# Patient Record
Sex: Male | Born: 1980 | Race: Black or African American | Hispanic: No | Marital: Single
Health system: Southern US, Community
[De-identification: ages and names within clinical notes are randomized; demographics above are authoritative.]

## PROBLEM LIST (undated history)

## (undated) DIAGNOSIS — W3400XA Accidental discharge from unspecified firearms or gun, initial encounter: Secondary | ICD-10-CM

## (undated) DIAGNOSIS — G8220 Paraplegia, unspecified: Secondary | ICD-10-CM

## (undated) DIAGNOSIS — I10 Essential (primary) hypertension: Secondary | ICD-10-CM

## (undated) DIAGNOSIS — Z87440 Personal history of urinary (tract) infections: Secondary | ICD-10-CM

## (undated) DIAGNOSIS — L89899 Pressure ulcer of other site, unspecified stage: Secondary | ICD-10-CM

## (undated) DIAGNOSIS — N529 Male erectile dysfunction, unspecified: Secondary | ICD-10-CM

## (undated) DIAGNOSIS — F419 Anxiety disorder, unspecified: Secondary | ICD-10-CM

## (undated) DIAGNOSIS — I82409 Acute embolism and thrombosis of unspecified deep veins of unspecified lower extremity: Secondary | ICD-10-CM

## (undated) HISTORY — PX: LACERATION REPAIR: SHX5168

## (undated) HISTORY — DX: Acute embolism and thrombosis of unspecified deep veins of unspecified lower extremity: I82.409

## (undated) HISTORY — DX: Male erectile dysfunction, unspecified: N52.9

## (undated) HISTORY — PX: VENA CAVA FILTER PLACEMENT: SUR1032

## (undated) HISTORY — DX: Pressure ulcer of other site, unspecified stage: L89.899

## (undated) HISTORY — DX: Anxiety disorder, unspecified: F41.9

## (undated) HISTORY — PX: LAMINECTOMY: SHX219

## (undated) HISTORY — DX: Personal history of urinary (tract) infections: Z87.440

## (undated) HISTORY — DX: Paraplegia, unspecified: G82.20

---
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
## 1998-03-07 ENCOUNTER — Encounter: Payer: Self-pay | Admitting: Cardiothoracic Surgery

## 1998-03-07 ENCOUNTER — Encounter: Payer: Self-pay | Admitting: General Surgery

## 1998-03-07 ENCOUNTER — Encounter: Payer: Self-pay | Admitting: *Deleted

## 1998-03-07 ENCOUNTER — Inpatient Hospital Stay: Admission: EM | Admit: 1998-03-07 | Discharge: 1998-03-13 | Payer: Self-pay | Admitting: *Deleted

## 1998-03-09 ENCOUNTER — Encounter: Payer: Self-pay | Admitting: Cardiothoracic Surgery

## 1998-03-09 ENCOUNTER — Encounter: Payer: Self-pay | Admitting: *Deleted

## 1998-03-10 ENCOUNTER — Encounter: Payer: Self-pay | Admitting: Cardiothoracic Surgery

## 1998-03-12 ENCOUNTER — Encounter: Payer: Self-pay | Admitting: Thoracic Surgery (Cardiothoracic Vascular Surgery)

## 1998-03-13 ENCOUNTER — Inpatient Hospital Stay (HOSPITAL_COMMUNITY)
Admission: RE | Admit: 1998-03-13 | Discharge: 1998-04-03 | Payer: Self-pay | Admitting: Physical Medicine and Rehabilitation

## 1998-04-07 ENCOUNTER — Encounter: Admission: RE | Admit: 1998-04-07 | Discharge: 1998-07-06 | Payer: Self-pay

## 2001-09-24 ENCOUNTER — Emergency Department (HOSPITAL_COMMUNITY): Admission: EM | Admit: 2001-09-24 | Discharge: 2001-09-24 | Payer: Self-pay

## 2003-03-26 ENCOUNTER — Encounter: Payer: Self-pay | Admitting: Internal Medicine

## 2003-03-26 ENCOUNTER — Encounter: Admission: RE | Admit: 2003-03-26 | Discharge: 2003-03-26 | Payer: Self-pay | Admitting: Internal Medicine

## 2003-10-22 ENCOUNTER — Emergency Department (HOSPITAL_COMMUNITY): Admission: EM | Admit: 2003-10-22 | Discharge: 2003-10-22 | Payer: Self-pay | Admitting: Emergency Medicine

## 2003-12-13 ENCOUNTER — Emergency Department (HOSPITAL_COMMUNITY): Admission: EM | Admit: 2003-12-13 | Discharge: 2003-12-14 | Payer: Self-pay | Admitting: Emergency Medicine

## 2005-05-12 ENCOUNTER — Ambulatory Visit: Payer: Self-pay | Admitting: Internal Medicine

## 2005-05-14 ENCOUNTER — Encounter (HOSPITAL_BASED_OUTPATIENT_CLINIC_OR_DEPARTMENT_OTHER): Admission: RE | Admit: 2005-05-14 | Discharge: 2005-08-12 | Payer: Self-pay | Admitting: Surgery

## 2005-10-27 ENCOUNTER — Encounter: Payer: Self-pay | Admitting: Physical Medicine and Rehabilitation

## 2005-10-27 ENCOUNTER — Encounter: Payer: Self-pay | Admitting: General Surgery

## 2005-10-27 ENCOUNTER — Encounter: Payer: Self-pay | Admitting: Cardiothoracic Surgery

## 2006-04-11 ENCOUNTER — Ambulatory Visit: Payer: Self-pay | Admitting: Internal Medicine

## 2006-06-09 ENCOUNTER — Ambulatory Visit: Payer: Self-pay | Admitting: Internal Medicine

## 2006-10-08 ENCOUNTER — Inpatient Hospital Stay (HOSPITAL_COMMUNITY): Admission: EM | Admit: 2006-10-08 | Discharge: 2006-10-11 | Payer: Self-pay | Admitting: Emergency Medicine

## 2006-11-16 ENCOUNTER — Ambulatory Visit: Payer: Self-pay | Admitting: Internal Medicine

## 2007-01-25 ENCOUNTER — Ambulatory Visit: Payer: Self-pay | Admitting: Internal Medicine

## 2007-03-09 ENCOUNTER — Ambulatory Visit: Payer: Self-pay | Admitting: Internal Medicine

## 2007-04-10 ENCOUNTER — Encounter (HOSPITAL_BASED_OUTPATIENT_CLINIC_OR_DEPARTMENT_OTHER): Admission: RE | Admit: 2007-04-10 | Discharge: 2007-07-09 | Payer: Self-pay | Admitting: Surgery

## 2007-04-25 ENCOUNTER — Encounter: Payer: Self-pay | Admitting: Internal Medicine

## 2007-05-09 ENCOUNTER — Encounter: Payer: Self-pay | Admitting: Internal Medicine

## 2007-05-09 DIAGNOSIS — N390 Urinary tract infection, site not specified: Secondary | ICD-10-CM

## 2007-05-09 DIAGNOSIS — F528 Other sexual dysfunction not due to a substance or known physiological condition: Secondary | ICD-10-CM

## 2007-05-09 DIAGNOSIS — F411 Generalized anxiety disorder: Secondary | ICD-10-CM

## 2007-05-09 DIAGNOSIS — N529 Male erectile dysfunction, unspecified: Secondary | ICD-10-CM | POA: Insufficient documentation

## 2007-05-09 DIAGNOSIS — G8220 Paraplegia, unspecified: Secondary | ICD-10-CM

## 2007-05-09 HISTORY — DX: Paraplegia, unspecified: G82.20

## 2008-03-29 ENCOUNTER — Ambulatory Visit: Payer: Self-pay | Admitting: Internal Medicine

## 2008-03-29 DIAGNOSIS — L89309 Pressure ulcer of unspecified buttock, unspecified stage: Secondary | ICD-10-CM | POA: Insufficient documentation

## 2008-03-29 DIAGNOSIS — L89899 Pressure ulcer of other site, unspecified stage: Secondary | ICD-10-CM

## 2008-03-29 DIAGNOSIS — F1010 Alcohol abuse, uncomplicated: Secondary | ICD-10-CM

## 2008-04-22 ENCOUNTER — Encounter: Payer: Self-pay | Admitting: Internal Medicine

## 2008-06-13 ENCOUNTER — Encounter: Payer: Self-pay | Admitting: Internal Medicine

## 2008-07-17 ENCOUNTER — Telehealth: Payer: Self-pay | Admitting: Internal Medicine

## 2008-07-18 ENCOUNTER — Encounter: Payer: Self-pay | Admitting: Internal Medicine

## 2008-08-05 ENCOUNTER — Encounter: Payer: Self-pay | Admitting: Internal Medicine

## 2008-09-06 ENCOUNTER — Ambulatory Visit: Payer: Self-pay | Admitting: Internal Medicine

## 2008-09-06 DIAGNOSIS — K5289 Other specified noninfective gastroenteritis and colitis: Secondary | ICD-10-CM

## 2008-09-06 DIAGNOSIS — R030 Elevated blood-pressure reading, without diagnosis of hypertension: Secondary | ICD-10-CM | POA: Insufficient documentation

## 2008-10-07 ENCOUNTER — Telehealth: Payer: Self-pay | Admitting: Internal Medicine

## 2008-10-07 ENCOUNTER — Encounter: Payer: Self-pay | Admitting: Internal Medicine

## 2008-10-23 ENCOUNTER — Encounter: Payer: Self-pay | Admitting: Internal Medicine

## 2008-11-28 ENCOUNTER — Telehealth: Payer: Self-pay | Admitting: Internal Medicine

## 2009-01-14 ENCOUNTER — Telehealth: Payer: Self-pay | Admitting: Internal Medicine

## 2009-01-22 ENCOUNTER — Encounter (HOSPITAL_BASED_OUTPATIENT_CLINIC_OR_DEPARTMENT_OTHER): Admission: RE | Admit: 2009-01-22 | Discharge: 2009-04-22 | Payer: Self-pay | Admitting: Internal Medicine

## 2009-01-23 ENCOUNTER — Encounter: Payer: Self-pay | Admitting: Internal Medicine

## 2009-02-25 ENCOUNTER — Telehealth: Payer: Self-pay | Admitting: Internal Medicine

## 2009-03-10 ENCOUNTER — Telehealth: Payer: Self-pay | Admitting: Internal Medicine

## 2009-06-03 ENCOUNTER — Encounter: Payer: Self-pay | Admitting: Internal Medicine

## 2009-06-03 ENCOUNTER — Telehealth: Payer: Self-pay | Admitting: Internal Medicine

## 2009-06-05 ENCOUNTER — Telehealth: Payer: Self-pay | Admitting: Internal Medicine

## 2009-10-28 ENCOUNTER — Telehealth: Payer: Self-pay | Admitting: Internal Medicine

## 2009-11-04 ENCOUNTER — Inpatient Hospital Stay (HOSPITAL_COMMUNITY): Admission: AD | Admit: 2009-11-04 | Discharge: 2009-11-14 | Payer: Self-pay | Admitting: Internal Medicine

## 2009-11-04 ENCOUNTER — Ambulatory Visit: Payer: Self-pay | Admitting: Vascular Surgery

## 2009-11-04 ENCOUNTER — Ambulatory Visit: Payer: Self-pay | Admitting: Internal Medicine

## 2009-11-04 ENCOUNTER — Ambulatory Visit (HOSPITAL_COMMUNITY): Admission: RE | Admit: 2009-11-04 | Discharge: 2009-11-04 | Payer: Self-pay | Admitting: Internal Medicine

## 2009-11-04 ENCOUNTER — Encounter: Payer: Self-pay | Admitting: Internal Medicine

## 2009-11-04 DIAGNOSIS — N3000 Acute cystitis without hematuria: Secondary | ICD-10-CM

## 2009-11-04 DIAGNOSIS — R609 Edema, unspecified: Secondary | ICD-10-CM

## 2009-11-06 ENCOUNTER — Ambulatory Visit: Payer: Self-pay | Admitting: Internal Medicine

## 2009-11-06 ENCOUNTER — Encounter: Payer: Self-pay | Admitting: Internal Medicine

## 2009-11-14 ENCOUNTER — Telehealth: Payer: Self-pay | Admitting: Internal Medicine

## 2009-11-14 DIAGNOSIS — I2699 Other pulmonary embolism without acute cor pulmonale: Secondary | ICD-10-CM

## 2009-11-17 ENCOUNTER — Telehealth: Payer: Self-pay | Admitting: Internal Medicine

## 2009-11-17 ENCOUNTER — Ambulatory Visit: Payer: Self-pay | Admitting: Cardiology

## 2009-11-20 ENCOUNTER — Telehealth: Payer: Self-pay | Admitting: Internal Medicine

## 2009-11-20 ENCOUNTER — Encounter: Payer: Self-pay | Admitting: Internal Medicine

## 2009-11-21 ENCOUNTER — Ambulatory Visit: Payer: Self-pay | Admitting: Cardiology

## 2009-11-21 ENCOUNTER — Telehealth: Payer: Self-pay | Admitting: Pulmonary Disease

## 2009-11-21 ENCOUNTER — Telehealth: Payer: Self-pay | Admitting: Internal Medicine

## 2009-11-28 ENCOUNTER — Ambulatory Visit: Payer: Self-pay | Admitting: Cardiovascular Disease

## 2009-11-29 LAB — CONVERTED CEMR LAB
INR: 7.8 (ref 0.8–1.0)
Prothrombin Time: 79.2 s (ref 9.1–11.7)

## 2009-12-02 ENCOUNTER — Ambulatory Visit: Payer: Self-pay | Admitting: Cardiology

## 2009-12-04 ENCOUNTER — Ambulatory Visit: Payer: Self-pay | Admitting: Pulmonary Disease

## 2009-12-04 DIAGNOSIS — I80299 Phlebitis and thrombophlebitis of other deep vessels of unspecified lower extremity: Secondary | ICD-10-CM | POA: Insufficient documentation

## 2009-12-08 ENCOUNTER — Ambulatory Visit: Payer: Self-pay | Admitting: Cardiology

## 2009-12-08 LAB — CONVERTED CEMR LAB: POC INR: 2.1

## 2009-12-17 ENCOUNTER — Ambulatory Visit: Payer: Self-pay

## 2009-12-17 LAB — CONVERTED CEMR LAB: POC INR: 3.6

## 2009-12-18 ENCOUNTER — Ambulatory Visit: Payer: Self-pay | Admitting: Internal Medicine

## 2009-12-24 ENCOUNTER — Telehealth: Payer: Self-pay | Admitting: Internal Medicine

## 2009-12-30 ENCOUNTER — Telehealth: Payer: Self-pay | Admitting: Internal Medicine

## 2009-12-30 ENCOUNTER — Ambulatory Visit (HOSPITAL_COMMUNITY): Admission: RE | Admit: 2009-12-30 | Discharge: 2009-12-30 | Payer: Self-pay | Admitting: Internal Medicine

## 2009-12-31 ENCOUNTER — Telehealth: Payer: Self-pay | Admitting: Internal Medicine

## 2010-01-08 ENCOUNTER — Telehealth: Payer: Self-pay | Admitting: Internal Medicine

## 2010-01-21 ENCOUNTER — Telehealth: Payer: Self-pay | Admitting: Internal Medicine

## 2010-01-26 ENCOUNTER — Ambulatory Visit: Payer: Self-pay | Admitting: Internal Medicine

## 2010-01-28 ENCOUNTER — Ambulatory Visit: Payer: Self-pay | Admitting: Cardiovascular Disease

## 2010-01-28 LAB — CONVERTED CEMR LAB: POC INR: 2.4

## 2010-01-31 ENCOUNTER — Encounter: Payer: Self-pay | Admitting: Internal Medicine

## 2010-02-23 ENCOUNTER — Telehealth: Payer: Self-pay | Admitting: Internal Medicine

## 2010-02-23 ENCOUNTER — Emergency Department (HOSPITAL_COMMUNITY): Admission: EM | Admit: 2010-02-23 | Discharge: 2010-02-24 | Payer: Self-pay | Admitting: Emergency Medicine

## 2010-02-24 ENCOUNTER — Telehealth: Payer: Self-pay | Admitting: Internal Medicine

## 2010-02-25 ENCOUNTER — Ambulatory Visit: Payer: Self-pay | Admitting: Cardiology

## 2010-02-25 LAB — CONVERTED CEMR LAB: POC INR: 4.6

## 2010-02-26 ENCOUNTER — Encounter: Admission: RE | Admit: 2010-02-26 | Discharge: 2010-03-27 | Payer: Self-pay | Admitting: Internal Medicine

## 2010-02-26 ENCOUNTER — Ambulatory Visit: Payer: Self-pay | Admitting: Internal Medicine

## 2010-02-26 ENCOUNTER — Telehealth: Payer: Self-pay | Admitting: Internal Medicine

## 2010-02-26 LAB — CONVERTED CEMR LAB
Glucose, Urine, Semiquant: NEGATIVE
Ketones, urine, test strip: NEGATIVE
Nitrite: NEGATIVE
Protein, U semiquant: NEGATIVE
Specific Gravity, Urine: >=1.03
Urobilinogen, UA: 0.2
pH: 5

## 2010-03-04 ENCOUNTER — Ambulatory Visit: Payer: Self-pay | Admitting: Cardiovascular Disease

## 2010-03-05 ENCOUNTER — Encounter: Payer: Self-pay | Admitting: Internal Medicine

## 2010-03-10 ENCOUNTER — Ambulatory Visit: Payer: Self-pay | Admitting: Cardiovascular Disease

## 2010-03-26 ENCOUNTER — Encounter: Payer: Self-pay | Admitting: Internal Medicine

## 2010-04-06 ENCOUNTER — Telehealth: Payer: Self-pay | Admitting: Internal Medicine

## 2010-04-09 ENCOUNTER — Ambulatory Visit: Payer: Self-pay | Admitting: Cardiology

## 2010-04-20 ENCOUNTER — Ambulatory Visit: Payer: Self-pay | Admitting: Internal Medicine

## 2010-04-20 DIAGNOSIS — N63 Unspecified lump in unspecified breast: Secondary | ICD-10-CM | POA: Insufficient documentation

## 2010-04-30 ENCOUNTER — Ambulatory Visit: Payer: Self-pay | Admitting: Cardiology

## 2010-05-14 ENCOUNTER — Encounter: Payer: Self-pay | Admitting: Internal Medicine

## 2010-05-14 ENCOUNTER — Ambulatory Visit: Payer: Self-pay

## 2010-05-14 LAB — CONVERTED CEMR LAB
INR: 7.3
INR: 7.3 (ref 0.8–1.0)
POC INR: 6.8
Prothrombin Time: 77.7 s (ref 9.7–11.8)

## 2010-06-02 ENCOUNTER — Ambulatory Visit: Payer: Self-pay | Admitting: Cardiovascular Disease

## 2010-06-02 LAB — CONVERTED CEMR LAB: POC INR: 1.3

## 2010-06-08 ENCOUNTER — Inpatient Hospital Stay (HOSPITAL_COMMUNITY)
Admission: EM | Admit: 2010-06-08 | Discharge: 2010-06-14 | Payer: Self-pay | Source: Home / Self Care | Attending: Internal Medicine | Admitting: Internal Medicine

## 2010-06-08 ENCOUNTER — Telehealth: Payer: Self-pay | Admitting: Internal Medicine

## 2010-06-08 ENCOUNTER — Encounter (INDEPENDENT_AMBULATORY_CARE_PROVIDER_SITE_OTHER): Payer: Self-pay | Admitting: Emergency Medicine

## 2010-06-09 ENCOUNTER — Encounter: Payer: Self-pay | Admitting: Pulmonary Disease

## 2010-06-12 ENCOUNTER — Telehealth: Payer: Self-pay | Admitting: Internal Medicine

## 2010-06-16 ENCOUNTER — Telehealth: Payer: Self-pay | Admitting: Internal Medicine

## 2010-06-17 ENCOUNTER — Ambulatory Visit: Payer: Self-pay | Admitting: Internal Medicine

## 2010-06-17 ENCOUNTER — Encounter: Payer: Self-pay | Admitting: Internal Medicine

## 2010-06-17 ENCOUNTER — Telehealth: Payer: Self-pay | Admitting: Internal Medicine

## 2010-06-23 ENCOUNTER — Ambulatory Visit: Payer: Self-pay | Admitting: Internal Medicine

## 2010-06-25 ENCOUNTER — Telehealth: Payer: Self-pay | Admitting: Internal Medicine

## 2010-07-01 ENCOUNTER — Ambulatory Visit: Admit: 2010-07-01 | Payer: Self-pay

## 2010-07-06 ENCOUNTER — Ambulatory Visit: Admission: RE | Admit: 2010-07-06 | Discharge: 2010-07-06 | Payer: Self-pay | Source: Home / Self Care

## 2010-07-06 LAB — CONVERTED CEMR LAB: POC INR: 2

## 2010-07-28 NOTE — Medication Information (Signed)
Summary: rov/kb  Anticoagulant Therapy  Managed by: Cloyde Reams, RN, BSN PCP: Jacques Navy MD Supervising MD: Excell Seltzer MD, Casimiro Needle Indication 1: DVT/PE (first episode) Lab Used: [facility] [place] Site: [facility] PT 79.2 INR POC 6.6 INR RANGE 2.0-3.0  Dietary changes: yes       Details: Less vit K this week. Pt states he had incr ETOH yesterday.  Health status changes: no    Bleeding/hemorrhagic complications: no    Recent/future hospitalizations: no    Any changes in medication regimen? no    Recent/future dental: no  Any missed doses?: no       Is patient compliant with meds? yes       Allergies: No Known Drug Allergies  Anticoagulation Management History:      The patient is taking warfarin and comes in today for a routine follow up visit.  Negative risk factors for bleeding include an age less than 32 years old.  The bleeding index is 'low risk'.  Negative CHADS2 values include Age > 65 years old.  Today's INR is 7.8.  Prothrombin time is 79.2.  Anticoagulation responsible provider: Excell Seltzer MD, Casimiro Needle.  INR POC: 6.6.  Cuvette Lot#: [id number].  Exp: 01/2011.    Anticoagulation Management Assessment/Plan:      The patient's current anticoagulation dose is Coumadin 5 mg tabs: as directed.  The target INR is 2.0-3.0.  The next INR is due 12/02/2009.  Anticoagulation instructions were given to patient.  Results were reviewed/authorized by Cloyde Reams, RN, BSN.  He was notified by Cloyde Reams RN.         Prior Anticoagulation Instructions: INR 2.5 Take 2 tablets daily.Recheck  in 1 week.  Current Anticoagulation Instructions: INR 6.6 sent to lab INR 7.8  Pt has taken dosage today, aware to hold coumadin until contacted by [place].  Hold coumadin recheck on Tuesday.

## 2010-07-28 NOTE — Progress Notes (Signed)
Summary: call request  Phone Note Call from Patient Call back at Home Phone [phone]   Summary of Call: Patient left message on triage that he would like to have the filter taken out of his chest. Please call him at number above.John Figueroa,  December 24, 2009 1:23 PM  Patient called again in regards to having filter removed from his chest. Please advise. John Figueroa  December 24, 2009 3:40 PM   Follow-up for Phone Call        [facility] notified. Follow-up by: Jacques Navy MD,  December 24, 2009 4:46 PM

## 2010-07-28 NOTE — Progress Notes (Signed)
Summary: DRINKING CONCERNS from Mother  Phone Note Call from Patient   Caller: Arloa Koh [phone] Summary of Call: Pt's mother called, She is on pt's updated HIPPA form. She is very concerned b/c pt continues to drink. She feel that the patient is "going to kill himself w/the drinking" b/c he is on coumadin.  Initial call taken by: Lamar Sprinkles, CMA,  Nov 20, 2009 10:57 AM  Follow-up for Phone Call        I have spoken with him about drinking and will be happy to discuss this again with him.  Follow-up by: Jacques Navy MD,  Nov 20, 2009 6:13 PM  Additional Follow-up for Phone Call Additional follow up Details #1::        Pt's mother informed Additional Follow-up by: Lamar Sprinkles, CMA,  Nov 21, 2009 9:51 AM

## 2010-07-28 NOTE — Progress Notes (Signed)
  Phone Note Other Incoming   Caller: Radiology at [facility]- [phone] Details for Reason: Question on IVC filter removal Summary of Call: Maralyn Sago can you help me with this. I got a call from [facility], wanting to know about pt having his IVC filter removed. I see an order for the pt to have IVC filter removed and appt was set up for this in July. Apparently pt never had it removed. HELP Initial call taken by: Ami Bullins CMA,  April 06, 2010 11:17 AM  Follow-up for Phone Call        Spoke w/pt - he said he went to short stay for removal on 12/30/09. I called radiology and they confirmed that this procedure was done. [facility] will update their records.  Follow-up by: Lamar Sprinkles, CMA,  April 08, 2010 12:01 PM

## 2010-07-28 NOTE — Medication Information (Signed)
Summary: rov/sp  Anticoagulant Therapy  Managed by: Eda Keys, PharmD PCP: Jacques Navy MD Supervising MD: Eden Emms MD, Theron Arista Indication 1: DVT/PE (first episode) Lab Used: [facility] [place] Site: [facility] INR POC 1.5 INR RANGE 2.0-3.0  Dietary changes: no    Health status changes: no    Bleeding/hemorrhagic complications: yes       Details: had been taken off warfarin to blood in urine  Recent/future hospitalizations: yes       Details: was in the hospital overnight for hematuria  Any changes in medication regimen? no    Recent/future dental: no  Any missed doses?: no       Is patient compliant with meds? yes       Allergies: No Known Drug Allergies  Anticoagulation Management History:      Negative risk factors for bleeding include an age less than 46 years old.  The bleeding index is 'low risk'.  Negative CHADS2 values include Age > 18 years old.  His last INR was 7.8 ratio.  Anticoagulation responsible provider: Eden Emms MD, Theron Arista.  INR POC: 1.5.  Cuvette Lot#: [id number].  Exp: 03/2011.    Anticoagulation Management Assessment/Plan:      The patient's current anticoagulation dose is Coumadin 5 mg tabs: as directed.  The target INR is 2.0-3.0.  The next INR is due 03/09/2010.  Anticoagulation instructions were given to patient.  Results were reviewed/authorized by Eda Keys, PharmD.  He was notified by Kennieth Francois.         Prior Anticoagulation Instructions: INR 4.6  Do not take Coumadin again until Saturday, Sept. 3.  Begin taking 2 tablets daily except 1 tablet on Sundays and Wednesdays.  Return to clinic on Wednesday, Sept.7  Current Anticoagulation Instructions: INR 1.5   Continue taking two tablets every day except for one tablet on Sunday and Wednesday.  We will see you in 5 days.

## 2010-07-28 NOTE — Progress Notes (Signed)
Summary: Cut on Foot  Phone Note Call from Patient Call back at Home Phone [phone]   Summary of Call: Patient scraped his foot on concrete. C/o large amount of bleeding when it happened. Bleeding is miminal now per pt. Advised pt to keep clean and bandaged. Alert office w/any changes, redness, swelling, discharge or increased bleeding. Pt agreed. He has apt w/coumadin clinic today. If they think wound needs eval pt will call back today. Initial call taken by: Lamar Sprinkles, CMA,  Nov 21, 2009 8:50 AM  Follow-up for Phone Call        agree with advice Follow-up by: Jacques Navy MD,  Nov 21, 2009 1:00 PM

## 2010-07-28 NOTE — Medication Information (Signed)
Summary: rov/td  Anticoagulant Therapy  Managed by: Weston Brass, PharmD PCP: Jacques Navy MD Supervising MD: Myrtis Ser MD, Tinnie Gens Indication 1: DVT/PE (first episode) Lab Used: [facility] [place] Site: [facility] INR POC 2.5 INR RANGE 2.0-3.0  Dietary changes: no    Health status changes: yes       Details: abrasion on right foot and had some bleeding, patient took care of wound himself. Patient has swelling on right leg from thigh to knee and been attended to by  Dr. Vivi Ferns.  Bleeding/hemorrhagic complications: no    Recent/future hospitalizations: no    Any changes in medication regimen? no    Recent/future dental: no  Any missed doses?: yes     Details: Patient told to hold coumadin for a week INR- 5.6  Is patient compliant with meds? yes       Allergies: No Known Drug Allergies  Anticoagulation Management History:      The patient is taking warfarin and comes in today for a routine follow up visit.  Negative risk factors for bleeding include an age less than 32 years old.  The bleeding index is 'low risk'.  Negative CHADS2 values include Age > 38 years old.  Anticoagulation responsible provider: Myrtis Ser MD, Tinnie Gens.  INR POC: 2.5.  Cuvette Lot#: [id number].  Exp: 01/2011.    Anticoagulation Management Assessment/Plan:      The patient's current anticoagulation dose is Coumadin 5 mg tabs: as directed.  The target INR is 2.0-3.0.  The next INR is due 11/28/2009.  Anticoagulation instructions were given to patient.  Results were reviewed/authorized by Weston Brass, PharmD.  He was notified by [facility].         Prior Anticoagulation Instructions: INR = 5.6  Do not take any coumadin for the rest of the week.  We will see you back on Friday  Current Anticoagulation Instructions: INR 2.5 Take 2 tablets daily.Recheck  in 1 week.

## 2010-07-28 NOTE — Medication Information (Signed)
Summary: rov/tm  Anticoagulant Therapy  Managed by: Bethena Midget, RN, BSN PCP: Jacques Navy MD Supervising MD: Antoine Poche MD, Fayrene Fearing Indication 1: DVT/PE (first episode) Lab Used: [facility] [place] Site: [address] INR POC 2.1 INR RANGE 2.0-3.0  Dietary changes: no    Health status changes: no    Bleeding/hemorrhagic complications: no    Recent/future hospitalizations: no    Any changes in medication regimen? no    Recent/future dental: no  Any missed doses?: yes     Details: Missed Fridays dose  Is patient compliant with meds? yes       Allergies: No Known Drug Allergies  Anticoagulation Management History:      The patient is taking warfarin and comes in today for a routine follow up visit.  Negative risk factors for bleeding include an age less than 34 years old.  The bleeding index is 'low risk'.  Negative CHADS2 values include Age > 38 years old.  His last INR was 7.8 ratio.  Anticoagulation responsible provider: Antoine Poche MD, Fayrene Fearing.  INR POC: 2.1.  Cuvette Lot#: [id number].  Exp: 01/2011.    Anticoagulation Management Assessment/Plan:      The patient's current anticoagulation dose is Coumadin 5 mg tabs: as directed.  The target INR is 2.0-3.0.  The next INR is due 12/17/2009.  Anticoagulation instructions were given to patient.  Results were reviewed/authorized by Bethena Midget, RN, BSN.  He was notified by Bethena Midget, RN, BSN.         Prior Anticoagulation Instructions: INR 2.2 Change dose to 2 pills everyday except 1 pill on Wednesdays. Recheck in one week.  Current Anticoagulation Instructions: INR 2.1 Continue 10mg s everyday except 5mg s on Wednesdays. Recheck in one week

## 2010-07-28 NOTE — Progress Notes (Signed)
Summary: Change to Cipro  Phone Note Call from Patient Call back at Home Phone [phone]   Summary of Call: Patient is requesting to change back to cipro, feels it works better than macrodantin. Please adivse.  Initial call taken by: Lamar Sprinkles, CMA,  December 31, 2009 4:46 PM  Follow-up for Phone Call        ok, erx done Follow-up by: Newt Lukes MD,  December 31, 2009 4:58 PM  Additional Follow-up for Phone Call Additional follow up Details #1::        left vm for pt Additional Follow-up by: Lamar Sprinkles, CMA,  December 31, 2009 5:24 PM    New/Updated Medications: CIPRO 250 MG TABS (CIPROFLOXACIN HCL) 1 by mouth two times a day x 5 days Prescriptions: CIPRO 250 MG TABS (CIPROFLOXACIN HCL) 1 by mouth two times a day x 5 days  #10 x 1   Entered and Authorized by:   Newt Lukes MD   Signed by:   Newt Lukes MD on 12/31/2009   Method used:   Electronically to        [facility] [phone]* (retail)       [address]       [place], [place]  [address]       Ph: [phone]       Fax: [phone]   RxID:   [id number]

## 2010-07-28 NOTE — Progress Notes (Signed)
Summary: U/a?   Phone Note Call from Patient Call back at [facility] Phone [phone]   Summary of Call: Pt c/o some back pain & dark urine. Please advise.  Initial call taken by: Lamar Sprinkles, CMA,  February 23, 2010 3:12 PM  Follow-up for Phone Call        OK for Cipro 250mg  two times a day x 7. Needs OV if his symptoms don't clear Follow-up by: Jacques Navy MD,  February 23, 2010 3:36 PM  Additional Follow-up for Phone Call Additional follow up Details #1::        Pt informed, he already has rx for cipro and will start today.  Additional Follow-up by: Lamar Sprinkles, CMA,  February 23, 2010 4:59 PM

## 2010-07-28 NOTE — Medication Information (Signed)
Summary: ccr.gd  Anticoagulant Therapy  Managed by: Weston Brass, PharmD PCP: Jacques Navy MD Supervising MD: Eden Emms MD, Theron Arista Indication 1: DVT/PE (first episode) Lab Used: [facility] [place] Site: [facility] INR POC 2.4 INR RANGE 2.0-3.0  Dietary changes: no    Health status changes: no    Bleeding/hemorrhagic complications: no    Recent/future hospitalizations: no    Any changes in medication regimen? no    Recent/future dental: no  Any missed doses?: no       Is patient compliant with meds? yes       Allergies: No Known Drug Allergies  Anticoagulation Management History:      The patient is taking warfarin and comes in today for a routine follow up visit.  Negative risk factors for bleeding include an age less than 28 years old.  The bleeding index is 'low risk'.  Negative CHADS2 values include Age > 31 years old.  His last INR was 7.8 ratio.  Anticoagulation responsible provider: Eden Emms MD, Theron Arista.  INR POC: 2.4.  Exp: 02/2011.    Anticoagulation Management Assessment/Plan:      The patient's current anticoagulation dose is Coumadin 5 mg tabs: as directed.  The target INR is 2.0-3.0.  The next INR is due 02/25/2010.  Anticoagulation instructions were given to patient.  Results were reviewed/authorized by Weston Brass, PharmD.  He was notified by Gweneth Fritter. PharmD Candidate.         Prior Anticoagulation Instructions: INR 3.6  Skip today's dose of Coumadin then resume same dose of 2 tablets every day except 1 tablet on Wednesday.   Current Anticoagulation Instructions: INR- 2.4  Continue taking 2 tablets (10 mgs) Sun, Mon, Tues, Thurs, Fri, and Sat.  Take 1 tablet (5 mg) on Wed.
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
## 2010-07-28 NOTE — Progress Notes (Signed)
Summary: Wheelchair order  Phone Note Call from Patient   Caller: Mom Summary of Call: Mother called stating that they need a new order for wheel chair to send to a different company because the last one would not except. Order must say  Child psychotherapist Wheelchair" and add ref# [id number]. Will need to fax order to [phone]. Updates order created and faxed per pt request..Lakisha Archie CMA  January 08, 2010 2:08 PM

## 2010-07-28 NOTE — Medication Information (Signed)
Summary: rov/sel  Anticoagulant Therapy  Managed by: Cloyde Reams, RN, BSN PCP: Jacques Navy MD Supervising MD: Daleen Squibb MD, Maisie Fus Indication 1: DVT/PE (first episode) Lab Used: [facility] [place] Site: [address] INR POC 1.9 INR RANGE 2.0-3.0  Dietary changes: no    Health status changes: no    Bleeding/hemorrhagic complications: no    Recent/future hospitalizations: no    Any changes in medication regimen? no    Recent/future dental: no  Any missed doses?: yes     Details: Missed yesterday.   Is patient compliant with meds? yes       Allergies: No Known Drug Allergies  Anticoagulation Management History:      The patient is taking warfarin and comes in today for a routine follow up visit.  Negative risk factors for bleeding include an age less than 22 years old.  The bleeding index is 'low risk'.  Negative CHADS2 values include Age > 79 years old.  His last INR was 7.8 ratio.  Anticoagulation responsible provider: Daleen Squibb MD, Maisie Fus.  INR POC: 1.9.  Cuvette Lot#: [id number].  Exp: 05/2011.    Anticoagulation Management Assessment/Plan:      The patient's current anticoagulation dose is Coumadin 5 mg tabs: as directed.  The target INR is 2.0-3.0.  The next INR is due 05/14/2010.  Anticoagulation instructions were given to patient.  Results were reviewed/authorized by Cloyde Reams, RN, BSN.  He was notified by Cloyde Reams, RN, BSN.         Prior Anticoagulation Instructions: INR 1.8  Take 1 tablet everyday except 2 tablets on Tuesday, Thursday, and Saturday. Recheck in 2 weeks.   Current Anticoagulation Instructions: INR 1.9  Take 2.5 tablets today, then start taking 2 tablets daily except 1 tablet on Mondays, Wednesdays, and Fridays.  Recheck in 2 weeks.

## 2010-07-28 NOTE — Progress Notes (Signed)
Summary: COURT DATE  Phone Note Call from Patient   Summary of Call: Pt called regarding court date. He states that leg is a little better than when he left the hospital. He has concerns and says it swells when he puts his leg down. He feels that he will be in court for a long time tomorrow. Patient is requesting a note from MD faxed to lawyer excusing him from court tomorrow.  Initial call taken by: Lamar Sprinkles, CMA,  Nov 20, 2009 1:39 PM  Follow-up for Phone Call        letter done and at triage Follow-up by: Jacques Navy MD,  Nov 20, 2009 6:31 PM  Additional Follow-up for Phone Call Additional follow up Details #1::        faxed, Pt informed  Additional Follow-up by: Lamar Sprinkles, CMA,  Nov 21, 2009 8:50 AM
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
## 2010-07-28 NOTE — Progress Notes (Signed)
Summary: Call Report  Phone Note Other Incoming   Caller: [facility] Summary of Call: [facility] Triage Call Report Triage Record Num: [id number] Operator: Merlinda Frederick Patient Name: John Figueroa Call Date & Time: 02/23/2010 9:26:06PM Patient Phone: [phone] PCP: Illene Regulus Patient Gender: Male PCP Fax : [phone] Patient DOB: January 06, 1981 Practice Name: [facility] Reason for Call: Pt calling 02/23/10 about blood in urine. Onset 02/22/10. Pt also c/o flank pain. Pt is on Coumadin for a hx of DVT. Afebrile. Rates back pain as 10/10. Per Bloody Urine guideline, advised ED. Pt will go to [facility] for further evaluation. Protocol(s) Used: Bloody Urine Recommended Outcome per Protocol: See ED Immediately Reason for Outcome: Unbearable flank, low back or lower abdominal pain Care Advice:  ~ Another adult should drive. 08/ Initial call taken by: Margaret Pyle, CMA,  February 24, 2010 8:13 AM  Follow-up for Phone Call        k Follow-up by: Jacques Navy MD,  February 24, 2010 8:56 AM

## 2010-07-28 NOTE — Progress Notes (Signed)
Summary: BED REST?  Phone Note Call from Patient   Summary of Call: Pt was instructed to stay on bed rest and he will miss a court date. Patient is requesting that we contact his lawyer to confirm this. Does pt need to stay on bed rest? If so how long?  Initial call taken by: John Figueroa, CMA,  Nov 17, 2009 1:52 PM  Follow-up for Phone Call        John Figueroa is not restricted to bedrest. His DVT should be stable now 8 days out from diagnosis and fully anticoagulated. Follow-up by: Jacques Navy MD,  Nov 18, 2009 9:44 AM  Additional Follow-up for Phone Call Additional follow up Details #1::        Pt informed and he will appear for court date this friday Additional Follow-up by: John Figueroa, CMA,  Nov 19, 2009 5:59 PM
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
## 2010-07-28 NOTE — Medication Information (Signed)
Summary: ccr/mj  Anticoagulant Therapy  Managed by: Bethena Midget, RN, BSN PCP: Jacques Navy MD Supervising MD: Jens Som MD, Arlys John Indication 1: DVT/PE (first episode) Lab Used: [facility] [place] Site: [address] INR POC 6.8 INR RANGE 2.0-3.0  Dietary changes: yes       Details: Eating less greens, Pt states he has been drinking alot of ETOH  Health status changes: no    Bleeding/hemorrhagic complications: no    Recent/future hospitalizations: no    Any changes in medication regimen? no    Recent/future dental: no  Any missed doses?: no       Is patient compliant with meds? yes      Comments: Pts INR was 6.8, sent to lab for venous draw. Pt was informed that venous was needed today for appropriate therapy management.  Best call back # [phone]  Allergies (verified): No Known Drug Allergies  Anticoagulation Management History:      The patient is taking warfarin and comes in today for a routine follow up visit.  Negative risk factors for bleeding include an age less than 83 years old.  The bleeding index is 'low risk'.  Negative CHADS2 values include Age > 9 years old.  His last INR was 7.8 ratio and today's INR is 7.3.  Anticoagulation responsible provider: Jens Som MD, Arlys John.  INR POC: 6.8.  Cuvette Lot#: [id number].  Exp: 04/2011.    Anticoagulation Management Assessment/Plan:      The patient's current anticoagulation dose is Coumadin 5 mg tabs: as directed.  The target INR is 2.0-3.0.  The next INR is due 05/19/2010.  Anticoagulation instructions were given to patient.  Results were reviewed/authorized by Bethena Midget, RN, BSN.  He was notified by Bethena Midget, RN, BSN .         Prior Anticoagulation Instructions: INR 1.9  Take 2.5 tablets today, then start taking 2 tablets daily except 1 tablet on Mondays, Wednesdays, and Fridays.  Recheck in 2 weeks.    Current Anticoagulation Instructions: INR 6.8 sent to lab:   INR from lab 7.3 Pt  states he took 5mg s today already  thus  hold other dose for today , fri and Sat. doses.  Only take 5mg  on Sundays then resume. Recheck in 4 days. Pt states he can't come in on Monday he wants Tues. The danger and risk of elevated INR explain. Pt still wants to RTC on Tues. Instructed to seek medical atten for bleeding.  Bethena Midget, RN, BSN  May 14, 2010 3:55 PM\par

## 2010-07-28 NOTE — Medication Information (Signed)
Summary: rov/jm  Anticoagulant Therapy  Managed by: Lyna Poser PharmD PCP: Jacques Navy MD Supervising MD: Clifton James MD,Christopher Indication 1: DVT/PE (first episode) Lab Used: [facility] [place] Site: [address] INR RANGE 2.0-3.0  Dietary changes: yes       Details: had less green leafy vegetables this weekend  Health status changes: no    Bleeding/hemorrhagic complications: no    Recent/future hospitalizations: no    Any changes in medication regimen? no    Recent/future dental: no  Any missed doses?: yes     Details: may have missed saturday  Is patient compliant with meds? yes       Allergies: No Known Drug Allergies  Anticoagulation Management History:      The patient is taking warfarin and comes in today for a routine follow up visit.  Negative risk factors for bleeding include an age less than 47 years old.  The bleeding index is 'low risk'.  Negative CHADS2 values include Age > 34 years old.  His last INR was 7.8 ratio.  Anticoagulation responsible provider: Clifton James MD,Christopher.  Cuvette Lot#: [id number].  Exp: 03/2011.    Anticoagulation Management Assessment/Plan:      The patient's current anticoagulation dose is Coumadin 5 mg tabs: as directed.  The target INR is 2.0-3.0.  The next INR is due 03/24/2010.  Anticoagulation instructions were given to patient.  Results were reviewed/authorized by Lyna Poser PharmD.  He was notified by Weston Brass PharmD.         Prior Anticoagulation Instructions: INR 1.5   Continue taking two tablets every day except for one tablet on Sunday and Wednesday.  We will see you in 5 days.    Current Anticoagulation Instructions: INR 3.5 Hold your dose today. Change dose to 2 tablets every day except 1 tablet on Sunday, Wednesday and Friday.  Recheck INR in 2 weeks.

## 2010-07-28 NOTE — Letter (Signed)
Summary: Handwritten Letter for Court/[place] [facility]  Handwritten Letter for Court/[place] [facility]   Imported By: Sherian Rein 11/26/2009 12:11:40  _____________________________________________________________________  External Attachment:    Type:   Image     Comment:   External Document
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
## 2010-07-28 NOTE — Medication Information (Signed)
Summary: rov/ewj  Anticoagulant Therapy  Managed by: Bethena Midget, RN, BSN PCP: Jacques Navy MD Supervising MD: Daleen Squibb MD, Maisie Fus Indication 1: DVT/PE (first episode) Lab Used: [facility] [place] Site: [address] INR POC 2.2 INR RANGE 2.0-3.0  Dietary changes: no    Health status changes: no    Bleeding/hemorrhagic complications: no    Recent/future hospitalizations: no    Any changes in medication regimen? no    Recent/future dental: no  Any missed doses?: no       Is patient compliant with meds? yes       Allergies: No Known Drug Allergies  Anticoagulation Management History:      The patient is taking warfarin and comes in today for a routine follow up visit.  Negative risk factors for bleeding include an age less than 17 years old.  The bleeding index is 'low risk'.  Negative CHADS2 values include Age > 78 years old.  His last INR was 7.8 ratio.  Anticoagulation responsible provider: Daleen Squibb MD, Maisie Fus.  INR POC: 2.2.  Cuvette Lot#: [id number].  Exp: 01/2011.    Anticoagulation Management Assessment/Plan:      The patient's current anticoagulation dose is Coumadin 5 mg tabs: as directed.  The target INR is 2.0-3.0.  The next INR is due 12/08/2009.  Anticoagulation instructions were given to patient.  Results were reviewed/authorized by Bethena Midget, RN, BSN.  He was notified by Bethena Midget, RN, BSN.         Prior Anticoagulation Instructions: INR 6.6 sent to lab INR 7.8  Pt has taken dosage today, aware to hold coumadin until contacted by [place].  Hold coumadin recheck on Tuesday.    Current Anticoagulation Instructions: INR 2.2 Change dose to 2 pills everyday except 1 pill on Wednesdays. Recheck in one week.

## 2010-07-28 NOTE — Medication Information (Signed)
Summary: rov/tm  Anticoagulant Therapy  Managed by: Weston Brass, PharmD PCP: Jacques Navy MD Supervising MD: Eden Emms MD, Theron Arista Indication 1: DVT/PE (first episode) Lab Used: [facility] [place] Site: [employer] INR POC 3.6 INR RANGE 2.0-3.0  Dietary changes: yes       Details: extra alcohol last weekend  Health status changes: no    Bleeding/hemorrhagic complications: no    Recent/future hospitalizations: no    Any changes in medication regimen? no    Recent/future dental: no  Any missed doses?: yes     Details: skipped Friday's dose because he knew he was drinking alcohol   Is patient compliant with meds? yes       Allergies: No Known Drug Allergies  Anticoagulation Management History:      The patient is taking warfarin and comes in today for a routine follow up visit.  Negative risk factors for bleeding include an age less than 37 years old.  The bleeding index is 'low risk'.  Negative CHADS2 values include Age > 22 years old.  His last INR was 7.8 ratio.  Anticoagulation responsible provider: Eden Emms MD, Theron Arista.  INR POC: 3.6.  Cuvette Lot#: [id number].  Exp: 02/2011.    Anticoagulation Management Assessment/Plan:      The patient's current anticoagulation dose is Coumadin 5 mg tabs: as directed.  The target INR is 2.0-3.0.  The next INR is due 12/31/2009.  Anticoagulation instructions were given to patient.  Results were reviewed/authorized by Weston Brass, PharmD.  He was notified by Weston Brass PharmD.         Prior Anticoagulation Instructions: INR 2.1 Continue 10mg s everyday except 5mg s on Wednesdays. Recheck in one week  Current Anticoagulation Instructions: INR 3.6  Skip today's dose of Coumadin then resume same dose of 2 tablets every day except 1 tablet on Wednesday.

## 2010-07-28 NOTE — Letter (Signed)
Summary: DME paperwork/[employer]  DME paperwork/[employer]   Imported By: Lester Yale 03/30/2010 09:47:08  _____________________________________________________________________  External Attachment:    Type:   Image     Comment:   External Document

## 2010-07-28 NOTE — Progress Notes (Signed)
  Phone Note From Other Clinic Call back at [phone](CELL)   Reason for Call: Schedule Patient Appt Summary of Call: good morning; 1) needs appt at coag clinic for Monday May 23. discharged from hospital with INR 2.8. on coumadin for massive PE. Call pt this PM with appt time. 2) needs appt Dr. Debby Bud, June 8.  Thanks Initial call taken by: Jacques Navy MD,  Nov 14, 2009 7:31 AM  Follow-up for Phone Call        Mr Auxier with need a ref in compt. 

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
## 2010-07-28 NOTE — Progress Notes (Signed)
Summary: Wheelchair RX  Phone Note Call from Patient Call back at [employer] [phone]   Summary of Call: Patient is requesting rx for new wheelchair. Ok to prepare for signature?   (will need to call pt w/details needed for rx) Initial call taken by: Lamar Sprinkles, CMA,  December 30, 2009 10:03 AM  Follow-up for Phone Call        ok Follow-up by: Tresa Garter MD,  December 31, 2009 8:08 AM  Additional Follow-up for Phone Call Additional follow up Details #1::        Patient left another message on triage but did not provide any further details. Lucious Groves  December 31, 2009 1:59 PM   Order completed and to be faxed to [facility], pt informed.  Additional Follow-up by: Lamar Sprinkles, CMA,  December 31, 2009 4:47 PM

## 2010-07-28 NOTE — Progress Notes (Signed)
Summary: CIPRO RF  Phone Note Refill Request Message from:  Pharmacy  Refills Requested: Medication #1:  CIPRO 250 MG  TABS Take 1 tablet by mouth two times a day Initial call taken by: Lamar Sprinkles, CMA,  Oct 28, 2009 8:43 AM  Follow-up for Phone Call        OK for refill of cipro - paraplegic with neurogenic bladder - at risk for recurrent infections. Cipro 250mg  two times a day x 5 days, 1 refill Follow-up by: Jacques Navy MD,  Oct 28, 2009 11:58 AM    Prescriptions: CIPRO 250 MG  TABS (CIPROFLOXACIN HCL) Take 1 tablet by mouth two times a day  #10 Tablet x 0   Entered by:   Lamar Sprinkles, CMA   Authorized by:   Jacques Navy MD   Signed by:   Lamar Sprinkles, CMA on 10/28/2009   Method used:   Electronically to        [facility] [phone]* (retail)       [address]       [place], [place]  [address]       Ph: [phone]       Fax: [phone]   RxID:   [id number]

## 2010-07-28 NOTE — Medication Information (Signed)
Summary: ccr  Anticoagulant Therapy  Managed by: Weston Brass, PharmD PCP: Jacques Navy MD Supervising MD: Eden Emms MD, Theron Arista Indication 1: DVT/PE (first episode) Lab Used: [facility] [place] Site: [address] INR POC 1.3 INR RANGE 2.0-3.0  Dietary changes: yes       Details: some alcohol fri- Pt edcuated on risks of binge drinking and bleeding.  he states he is trying to quit drinking.  INRs consistently out of range usually due to alcohol intake  Health status changes: no    Bleeding/hemorrhagic complications: no    Recent/future hospitalizations: no    Any changes in medication regimen? no    Recent/future dental: no  Any missed doses?: yes     Details: 12/5 & 12/2 maybe  Is patient compliant with meds? yes       Allergies: No Known Drug Allergies  Anticoagulation Management History:      The patient is taking warfarin and comes in today for a routine follow up visit.  Negative risk factors for bleeding include an age less than 19 years old.  The bleeding index is 'low risk'.  Negative CHADS2 values include Age > 25 years old.  His last INR was 7.3 ratio.  Anticoagulation responsible provider: Eden Emms MD, Theron Arista.  INR POC: 1.3.  Cuvette Lot#: [id number].  Exp: 03/2011.    Anticoagulation Management Assessment/Plan:      The patient's current anticoagulation dose is Coumadin 5 mg tabs: as directed.  The target INR is 2.0-3.0.  The next INR is due 06/15/2010.  Anticoagulation instructions were given to patient.  Results were reviewed/authorized by Weston Brass, PharmD.  He was notified by Weston Brass PharmD.         Prior Anticoagulation Instructions: INR 6.8 sent to lab:   INR from lab 7.3 Pt states he took 5mg s today already  thus  hold other dose for today , fri and Sat. doses.  Only take 5mg  on Sundays then resume. Recheck in 4 days. Pt states he can't come in on Monday he wants Tues. The danger and risk of elevated INR explain. Pt still wants to RTC on Tues.  Instructed to seek medical atten for bleeding.  Bethena Midget, RN, BSN  May 14, 2010 3:55 PM\par   Current Anticoagulation Instructions: INR 1.3  Take 2 tablets today and tomorrow then resume same dose of 2 tablets every day except 1 tablet on Monday, Wednesday and Friday.  Recheck INR in 10 days.

## 2010-07-28 NOTE — Medication Information (Signed)
Summary: ccn/dx:PE/ok per sally  Anticoagulant Therapy  Managed by: Bethanne Ginger, PharmD PCP: Jacques Navy MD Supervising MD: Jens Som MD, Arlys John Indication 1: DVT/PE (first episode) Lab Used: [facility] [place] Site: [address] INR POC 5.6 INR RANGE 2.0-3.0  Dietary changes: yes       Details: eats a lot of greens  Health status changes: no    Bleeding/hemorrhagic complications: no    Recent/future hospitalizations: yes       Details: d/c from [facility] on 5/20  Any changes in medication regimen? no    Recent/future dental: no  Any missed doses?: no       Is patient compliant with meds? yes      Comments: Pt was discharged on 5/20, and instructed to take 12.5 mg x 2 days.  Pt took 10 mg today prior to appt. Diagnosed with DVT/PE in the hospital  DVT included the right common femoral, saphenofemoral junction, femoral and profunda, popliteal, gastrocnemius, and posterior tibial veins. Bilateral PE  Current Medications (verified): 1)  Viagra 100 Mg  Tabs (Sildenafil Citrate) .Marland Kitchen.. 1 By Mouth As Needed 2)  Xanax 1 Mg  Tabs (Alprazolam) .... Take 1 Tablet By Mouth Every 8 Hours As Needed 3)  Promethazine Hcl 25 Mg Tabs (Promethazine Hcl) .Marland Kitchen.. 1 By Mouth Q 6 Hrs As Needed For Nausea 4)  Coumadin 5 Mg Tabs (Warfarin Sodium) .... As Directed  Allergies (verified): No Known Drug Allergies  Anticoagulation Management History:      The patient comes in today for his initial visit for anticoagulation therapy.  Negative risk factors for bleeding include an age less than 36 years old.  The bleeding index is 'low risk'.  Negative CHADS2 values include Age > 69 years old.  Anticoagulation responsible provider: Jens Som MD, Arlys John.  INR POC: 5.6.  Cuvette Lot#: [id number].  Exp: 01/2011.    Anticoagulation Management Assessment/Plan:      The patient's current anticoagulation dose is Coumadin 5 mg tabs: as directed.  The target INR is 2.0-3.0.  The next INR is due  11/21/2009.  Anticoagulation instructions were given to patient.  Results were reviewed/authorized by Bethanne Ginger, PharmD.  He was notified by Bethanne Ginger.         Current Anticoagulation Instructions: INR = 5.6  Do not take any coumadin for the rest of the week.  We will see you back on Friday

## 2010-07-28 NOTE — Medication Information (Signed)
Summary: rov/jk  Anticoagulant Therapy  Managed by: Weston Brass, PharmD PCP: Jacques Navy MD Supervising MD: Myrtis Ser MD, Tinnie Gens Indication 1: DVT/PE (first episode) Lab Used: [facility] [place] Site: [facility] INR POC 4.6 INR RANGE 2.0-3.0  Dietary changes: no    Health status changes: no    Bleeding/hemorrhagic complications: yes       Details: blood in urine, went to hospital  Recent/future hospitalizations: no    Any changes in medication regimen? yes       Details: Began taking Cipro on Monday  Recent/future dental: no  Any missed doses?: yes     Details: Told to stop Coumadin at hospital on Monday 02/16/10    Allergies: No Known Drug Allergies  Anticoagulation Management History:      Negative risk factors for bleeding include an age less than 63 years old.  The bleeding index is 'low risk'.  Negative CHADS2 values include Age > 44 years old.  His last INR was 7.8 ratio.  Anticoagulation responsible Cleto Claggett: Myrtis Ser MD, Tinnie Gens.  INR POC: 4.6.  Cuvette Lot#: [id number].  Exp: 04/2011.    Anticoagulation Management Assessment/Plan:      The patient's current anticoagulation dose is Coumadin 5 mg tabs: as directed.  The target INR is 2.0-3.0.  The next INR is due 03/04/2010.  Anticoagulation instructions were given to patient.  Results were reviewed/authorized by Weston Brass, PharmD.  He was notified by Liana Gerold, PharmD Candidate.         Prior Anticoagulation Instructions: INR- 2.4  Continue taking 2 tablets (10 mgs) Sun, Mon, Tues, Thurs, Fri, and Sat.  Take 1 tablet (5 mg) on Wed.   Current Anticoagulation Instructions: INR 4.6  Do not take Coumadin again until Saturday, Sept. 3.  Begin taking 2 tablets daily except 1 tablet on Sundays and Wednesdays.  Return to clinic on Wednesday, Sept.7
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
## 2010-07-28 NOTE — Miscellaneous (Signed)
Summary: Initial Summary for PT/[place]  Initial Summary for PT/[place]   Imported By: Sherian Rein 03/09/2010 12:17:06  _____________________________________________________________________  External Attachment:    Type:   Image     Comment:   External Document

## 2010-07-28 NOTE — Progress Notes (Signed)
Summary: nos appt  Phone Note Call from Patient   Caller: [email or web address]  Call For: alva Summary of Call: Pt will call to [facility] nos from 5/26. Initial call taken by: Darletta Moll,  Nov 21, 2009 8:59 AM

## 2010-07-28 NOTE — Progress Notes (Signed)
Summary: REFERRAL  Phone Note Call from Patient Call back at Home Phone [phone]   Summary of Call: Patient is requesting referral to physical therapist. Insurance told pt he must see PT to get his wheelchair.  Initial call taken by: Lamar Sprinkles, CMA,  January 21, 2010 1:26 PM  Follow-up for Phone Call        OK for PT eval. [facility] notified Follow-up by: Jacques Navy MD,  January 21, 2010 2:14 PM

## 2010-07-28 NOTE — Medication Information (Signed)
Summary: rov/mw  Anticoagulant Therapy  Managed by: Weston Brass, PharmD PCP: Jacques Navy MD Supervising MD: Ladona Ridgel MD, Sharlot Gowda Indication 1: DVT/PE (first episode) Lab Used: [facility] [place] Site: [facility] INR POC 1.8 INR RANGE 2.0-3.0  Dietary changes: no    Health status changes: no    Bleeding/hemorrhagic complications: no    Recent/future hospitalizations: no    Any changes in medication regimen? no    Recent/future dental: no  Any missed doses?: yes     Details: missed last night's dose  Is patient compliant with meds? yes       Allergies: No Known Drug Allergies  Anticoagulation Management History:      The patient is taking warfarin and comes in today for a routine follow up visit.  Negative risk factors for bleeding include an age less than 45 years old.  The bleeding index is 'low risk'.  Negative CHADS2 values include Age > 32 years old.  His last INR was 7.8 ratio.  Anticoagulation responsible Kemper Hochman: Ladona Ridgel MD, Sharlot Gowda.  INR POC: 1.8.  Cuvette Lot#: [id number].  Exp: 04/2011.    Anticoagulation Management Assessment/Plan:      The patient's current anticoagulation dose is Coumadin 5 mg tabs: as directed.  The target INR is 2.0-3.0.  The next INR is due 04/23/2010.  Anticoagulation instructions were given to patient.  Results were reviewed/authorized by Weston Brass, PharmD.  He was notified by Ilean Skill D candidate.         Prior Anticoagulation Instructions: INR 3.5 Hold your dose today. Change dose to 2 tablets every day except 1 tablet on Sunday, Wednesday and Friday.  Recheck INR in 2 weeks.   Current Anticoagulation Instructions: INR 1.8  Take 1 tablet everyday except 2 tablets on Tuesday, Thursday, and Saturday. Recheck in 2 weeks.

## 2010-07-30 NOTE — Medication Information (Signed)
Summary: ccr. gd  Anticoagulant Therapy  Managed by: Cloyde Reams, RN, BSN PCP: Jacques Navy MD Supervising MD: Jens Som MD, Arlys John Indication 1: DVT/PE (first episode) Lab Used: [facility] [place] Site: [address] INR POC 2.0 INR RANGE 2.0-3.0  Dietary changes: yes       Details: Less intake of vit K  Health status changes: no    Bleeding/hemorrhagic complications: no    Recent/future hospitalizations: no    Any changes in medication regimen? no    Recent/future dental: no  Any missed doses?: yes     Details: Missed 1 dosage last week.   Is patient compliant with meds? yes       Allergies: No Known Drug Allergies  Anticoagulation Management History:      The patient is taking warfarin and comes in today for a routine follow up visit.  Negative risk factors for bleeding include an age less than 38 years old.  The bleeding index is 'low risk'.  Negative CHADS2 values include Age > 32 years old.  His last INR was 7.3 ratio.  Anticoagulation responsible provider: Jens Som MD, Arlys John.  INR POC: 2.0.  Cuvette Lot#: [id number].  Exp: 07/2011.    Anticoagulation Management Assessment/Plan:      The patient's current anticoagulation dose is Coumadin 5 mg tabs: as directed.  The target INR is 2.0-3.0.  The next INR is due 08/03/2010.  Anticoagulation instructions were given to patient.  Results were reviewed/authorized by Cloyde Reams, RN, BSN.  He was notified by Cloyde Reams RN.         Prior Anticoagulation Instructions: INR 2.0 Continue 2 pills everyday except1 pill on Mondays, Wednesdays and Fridays. Recheck in 2-3 weeks.   Current Anticoagulation Instructions: INR 2.0  Continue on same dosage 2 tablets daily except 1 tablet on Mondays, Wednesdays, and Fridays.  Recheck in 4 weeks.

## 2010-07-30 NOTE — Progress Notes (Signed)
  Phone Note Other Incoming Message from:  Patient on June 12, 2010 10:18 AM  Caller: Pts mother [name] Details for Reason: Prescription for Frenh cath Summary of Call: Pts mother is calling and wanting a prescription printed out for french caths. Please Advise Initial call taken by: Ami Bullins CMA,  June 12, 2010 10:19 AM  Follow-up for Phone Call        he will be doing self cath when he returns home so we can refill the prescription for these. If we haven't prescribed in the past I will need the details: product number, number of catheters needed per month.  Follow-up by: Jacques Navy MD,  June 12, 2010 1:05 PM  Additional Follow-up for Phone Call Additional follow up Details #1::        form fax for cath supplies. Pt aware Additional Follow-up by: Ami Bullins CMA,  June 19, 2010 10:08 AM

## 2010-07-30 NOTE — Progress Notes (Signed)
SummaryPrudy Figueroa?  Phone Note Call from Patient   Summary of Call: Pt left vm, says pharm did not give pt all of xanax rx.  Initial call taken by: Lamar Sprinkles, CMA,  June 25, 2010 10:20 AM  Follow-up for Phone Call        Spoke w/pt, he says pharm did not get correction. Advised pt I will correct at pharm.  Per MD's notes from office visit rx was verbally corrected. I spoke w/pharmacy and they have correct info.  Follow-up by: Lamar Sprinkles, CMA,  June 25, 2010 5:31 PM    Prescriptions: John Figueroa 1 MG  TABS (ALPRAZOLAM) Take 1 tablet by mouth every 8 hours as needed  #90 x 5   Entered by:   Lamar Sprinkles, CMA   Authorized by:   Jacques Navy MD   Signed by:   Lamar Sprinkles, CMA on 06/25/2010   Method used:   Telephoned to ...       [facility]  [address] [phone]* (retail)       [address]       [place], [place]  [address]       Ph: [phone]       Fax: [phone]   RxID:   [id number]

## 2010-07-30 NOTE — Progress Notes (Signed)
  Phone Note Refill Request Message from:  Fax from Pharmacy on June 16, 2010 11:35 AM  Refills Requested: Medication #1:  ZOLPIDEM TARTRATE 10 MG TABS 1 by mouth at bedtime as needed please advise refills  Initial call taken by: Ami Bullins CMA,  June 16, 2010 11:35 AM  Follow-up for Phone Call        ok to refill as needed  Follow-up by: Jacques Navy MD,  June 16, 2010 1:32 PM    Prescriptions: ZOLPIDEM TARTRATE 10 MG TABS (ZOLPIDEM TARTRATE) 1 by mouth at bedtime as needed  #30 x 1   Entered by:   Ami Bullins CMA   Authorized by:   Jacques Navy MD   Signed by:   Bill Salinas CMA on 06/17/2010   Method used:   Telephoned to ...       [facility]  [address] [phone]* (retail)       [address]       [place], [place]  [address]       Ph: [phone]       Fax: [phone]   RxID:   [id number]

## 2010-07-30 NOTE — Progress Notes (Signed)
Summary: PT AT ER  Phone Note Call from Patient   Summary of Call: Pt left vm - c/o "peeing blood", vomiting & diarrhea. Called pt back - spoke w/mother pt is at [facility] ER now. Advised to tell hospital that Dr Debby Bud was PCP especially if pt was admitted.  Initial call taken by: Lamar Sprinkles, CMA,  June 08, 2010 2:35 PM

## 2010-07-30 NOTE — Progress Notes (Signed)
Summary: RF - xanax  Phone Note Refill Request Call back at Home Phone [phone] Message from:  Patient  Refills Requested: Medication #1:  XANAX 1 MG  TABS Take 1 tablet by mouth every 8 hours as needed [facility] [address]  Initial call taken by: Lamar Sprinkles, CMA,  June 17, 2010 11:13 AM  Follow-up for Phone Call        ok for refill x 5  Follow-up by: Jacques Navy MD,  June 17, 2010 1:24 PM  Additional Follow-up for Phone Call Additional follow up Details #1::        Pt informed  Additional Follow-up by: Lamar Sprinkles, CMA,  June 17, 2010 2:32 PM    Prescriptions: Prudy Feeler 1 MG  TABS (ALPRAZOLAM) Take 1 tablet by mouth every 8 hours as needed  #60 x 5   Entered by:   Lamar Sprinkles, CMA   Authorized by:   Jacques Navy MD   Signed by:   Lamar Sprinkles, CMA on 06/17/2010   Method used:   Telephoned to ...       [facility]  [address] [phone]* (retail)       [address]       [place], [place]  [address]       Ph: [phone]       Fax: [phone]   RxID:   [id number]
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
## 2010-07-30 NOTE — Medication Information (Signed)
Summary: ccr  Anticoagulant Therapy  Managed by: Bethena Midget, RN, BSN PCP: Jacques Navy MD Supervising MD: Shirlee Latch MD, Dalton Indication 1: DVT/PE (first episode) Lab Used: [facility] [place] Site: [address] INR POC 2.0 INR RANGE 2.0-3.0  Dietary changes: no    Health status changes: no    Bleeding/hemorrhagic complications: no    Recent/future hospitalizations: no    Any changes in medication regimen? yes       Details: Ceftin 250mg s BID for 10 days.   Recent/future dental: no  Any missed doses?: no       Is patient compliant with meds? yes       Allergies: No Known Drug Allergies  Anticoagulation Management History:      The patient is taking warfarin and comes in today for a routine follow up visit.  Negative risk factors for bleeding include an age less than 27 years old.  The bleeding index is 'low risk'.  Negative CHADS2 values include Age > 61 years old.  His last INR was 7.3 ratio.  Anticoagulation responsible provider: Shirlee Latch MD, Dalton.  INR POC: 2.0.  Cuvette Lot#: [id number].  Exp: 03/2011.    Anticoagulation Management Assessment/Plan:      The patient's current anticoagulation dose is Coumadin 5 mg tabs: as directed.  The target INR is 2.0-3.0.  The next INR is due 07/01/2010.  Anticoagulation instructions were given to patient.  Results were reviewed/authorized by Bethena Midget, RN, BSN.  He was notified by Bethena Midget, RN, BSN.         Prior Anticoagulation Instructions: INR 1.3  Take 2 tablets today and tomorrow then resume same dose of 2 tablets every day except 1 tablet on Monday, Wednesday and Friday.  Recheck INR in 10 days.   Current Anticoagulation Instructions: INR 2.0 Continue 2 pills everyday except1 pill on Mondays, Wednesdays and Fridays. Recheck in 2-3 weeks.

## 2010-07-30 NOTE — Letter (Signed)
Summary: [facility]  [facility]   Imported By: Sherian Rein 06/24/2010 08:54:08  _____________________________________________________________________  External Attachment:    Type:   Image     Comment:   External Document

## 2010-08-03 ENCOUNTER — Encounter (INDEPENDENT_AMBULATORY_CARE_PROVIDER_SITE_OTHER): Payer: BC Managed Care – PPO

## 2010-08-03 ENCOUNTER — Encounter: Payer: Self-pay | Admitting: Cardiology

## 2010-08-03 DIAGNOSIS — I2699 Other pulmonary embolism without acute cor pulmonale: Secondary | ICD-10-CM

## 2010-08-03 DIAGNOSIS — Z7901 Long term (current) use of anticoagulants: Secondary | ICD-10-CM

## 2010-08-03 LAB — CONVERTED CEMR LAB: POC INR: 3.3

## 2010-08-05 ENCOUNTER — Encounter: Payer: Self-pay | Admitting: Internal Medicine

## 2010-08-13 NOTE — Medication Information (Signed)
Summary: rov/kh  Anticoagulant Therapy  Managed by: Tammy Sours, PharmD PCP: Jacques Navy MD Supervising MD: Jens Som MD, Arlys John Indication 1: DVT/PE (first episode) Lab Used: [facility] [place] Site: [address] INR POC 3.3 INR RANGE 2.0-3.0  Dietary changes: no    Health status changes: no    Bleeding/hemorrhagic complications: no    Recent/future hospitalizations: no    Any changes in medication regimen? no    Recent/future dental: no  Any missed doses?: yes     Details: Yesterdays dose  Is patient compliant with meds? yes       Allergies: No Known Drug Allergies  Anticoagulation Management History:      The patient is taking warfarin and comes in today for a routine follow up visit.  Negative risk factors for bleeding include an age less than 44 years old.  The bleeding index is 'low risk'.  Negative CHADS2 values include Age > 64 years old.  His last INR was 7.3 ratio.  Anticoagulation responsible provider: Jens Som MD, Arlys John.  INR POC: 3.3.  Cuvette Lot#: [id number].  Exp: 07/2011.    Anticoagulation Management Assessment/Plan:      The patient's current anticoagulation dose is Coumadin 5 mg tabs: as directed.  The target INR is 2.0-3.0.  The next INR is due 08/31/2010.  Anticoagulation instructions were given to patient.  Results were reviewed/authorized by Tammy Sours, PharmD.         Prior Anticoagulation Instructions: INR 2.0  Continue on same dosage 2 tablets daily except 1 tablet on Mondays, Wednesdays, and Fridays.  Recheck in 4 weeks.   Current Anticoagulation Instructions: INR 3.3  Do not take Coumadin today. Resume taking 2 tablets daily except 1 tablet on Mondays, Wednesdays and Fridays. Patient is to increase amount of dark greens he eats. Recheck INR in 4 weeks.

## 2010-08-19 NOTE — Letter (Signed)
Summary: [facility]  [facility] for [facility]   Imported By: Sherian Rein 08/12/2010 12:43:05  _____________________________________________________________________  External Attachment:    Type:   Image     Comment:   External Document

## 2010-08-26 ENCOUNTER — Encounter: Payer: Self-pay | Admitting: Pulmonary Disease

## 2010-08-26 DIAGNOSIS — I80299 Phlebitis and thrombophlebitis of other deep vessels of unspecified lower extremity: Secondary | ICD-10-CM

## 2010-08-26 DIAGNOSIS — I2699 Other pulmonary embolism without acute cor pulmonale: Secondary | ICD-10-CM

## 2010-09-02 ENCOUNTER — Encounter: Payer: Self-pay | Admitting: Internal Medicine

## 2010-09-02 ENCOUNTER — Encounter (INDEPENDENT_AMBULATORY_CARE_PROVIDER_SITE_OTHER): Payer: BC Managed Care – PPO

## 2010-09-02 DIAGNOSIS — Z7901 Long term (current) use of anticoagulants: Secondary | ICD-10-CM

## 2010-09-02 DIAGNOSIS — I80299 Phlebitis and thrombophlebitis of other deep vessels of unspecified lower extremity: Secondary | ICD-10-CM

## 2010-09-07 LAB — RENAL FUNCTION PANEL
CO2: 21 mEq/L (ref 19–32)
Calcium: 7.7 mg/dL — ABNORMAL LOW (ref 8.4–10.5)
Creatinine, Ser: 0.83 mg/dL (ref 0.4–1.5)
Glucose, Bld: 99 mg/dL (ref 70–99)
Phosphorus: 1.6 mg/dL — ABNORMAL LOW (ref 2.3–4.6)

## 2010-09-07 LAB — CBC
HCT: 31.3 % — ABNORMAL LOW (ref 39.0–52.0)
Hemoglobin: 10.1 g/dL — ABNORMAL LOW (ref 13.0–17.0)
Hemoglobin: 9.3 g/dL — ABNORMAL LOW (ref 13.0–17.0)
MCHC: 32.3 g/dL (ref 30.0–36.0)
MCHC: 32.6 g/dL (ref 30.0–36.0)
MCHC: 32.9 g/dL (ref 30.0–36.0)
MCV: 71.5 fL — ABNORMAL LOW (ref 78.0–100.0)
Platelets: 231 10*3/uL (ref 150–400)
RDW: 17.5 % — ABNORMAL HIGH (ref 11.5–15.5)
RDW: 17.7 % — ABNORMAL HIGH (ref 11.5–15.5)
RDW: 17.9 % — ABNORMAL HIGH (ref 11.5–15.5)
WBC: 15.6 10*3/uL — ABNORMAL HIGH (ref 4.0–10.5)

## 2010-09-07 LAB — BASIC METABOLIC PANEL
Chloride: 110 mEq/L (ref 96–112)
GFR calc non Af Amer: >60 mL/min (ref >60)
Glucose, Bld: 76 mg/dL (ref 70–99)
Potassium: 3.5 mEq/L (ref 3.5–5.1)
Sodium: 136 mEq/L (ref 135–145)

## 2010-09-07 LAB — PROTIME-INR
INR: 1.91 — ABNORMAL HIGH (ref 0.00–1.49)
INR: 2.04 — ABNORMAL HIGH (ref 0.00–1.49)
Prothrombin Time: 20 seconds — ABNORMAL HIGH (ref 11.6–15.2)
Prothrombin Time: 22 seconds — ABNORMAL HIGH (ref 11.6–15.2)
Prothrombin Time: 23.2 seconds — ABNORMAL HIGH (ref 11.6–15.2)
Prothrombin Time: 24.8 seconds — ABNORMAL HIGH (ref 11.6–15.2)

## 2010-09-07 LAB — MAGNESIUM: Magnesium: 1.7 mg/dL (ref 1.5–2.5)

## 2010-09-07 LAB — HEPARIN LEVEL (UNFRACTIONATED)
Heparin Unfractionated: 0.33 IU/mL (ref 0.30–0.70)
Heparin Unfractionated: 0.43 IU/mL (ref 0.30–0.70)
Heparin Unfractionated: 0.71 IU/mL — ABNORMAL HIGH (ref 0.30–0.70)

## 2010-09-08 LAB — DIFFERENTIAL
Basophils Relative: 0 % (ref 0–1)
Basophils Relative: 0 % (ref 0–1)
Basophils Relative: 0 % (ref 0–1)
Eosinophils Absolute: 0 10*3/uL (ref 0.0–0.7)
Eosinophils Relative: 0 % (ref 0–5)
Eosinophils Relative: 0 % (ref 0–5)
Lymphocytes Relative: 3 % — ABNORMAL LOW (ref 12–46)
Lymphs Abs: 0.9 10*3/uL (ref 0.7–4.0)
Lymphs Abs: 1 10*3/uL (ref 0.7–4.0)
Monocytes Absolute: 2.8 10*3/uL — ABNORMAL HIGH (ref 0.1–1.0)
Monocytes Absolute: 3.4 10*3/uL — ABNORMAL HIGH (ref 0.1–1.0)
Monocytes Relative: 7 % (ref 3–12)
Monocytes Relative: 9 % (ref 3–12)
Neutro Abs: 42.4 10*3/uL — ABNORMAL HIGH (ref 1.7–7.7)
Neutro Abs: 44.4 10*3/uL — ABNORMAL HIGH (ref 1.7–7.7)
Neutrophils Relative %: 92 % — ABNORMAL HIGH (ref 43–77)

## 2010-09-08 LAB — PROTIME-INR
INR: 1.54 — ABNORMAL HIGH (ref 0.00–1.49)
INR: 2.19 — ABNORMAL HIGH (ref 0.00–1.49)

## 2010-09-08 LAB — CBC
HCT: 31.5 % — ABNORMAL LOW (ref 39.0–52.0)
Hemoglobin: 8.4 g/dL — ABNORMAL LOW (ref 13.0–17.0)
Hemoglobin: 8.5 g/dL — ABNORMAL LOW (ref 13.0–17.0)
Hemoglobin: 8.5 g/dL — ABNORMAL LOW (ref 13.0–17.0)
Hemoglobin: 9.8 g/dL — ABNORMAL LOW (ref 13.0–17.0)
MCH: 23.5 pg — ABNORMAL LOW (ref 26.0–34.0)
MCH: 23.6 pg — ABNORMAL LOW (ref 26.0–34.0)
MCH: 23.7 pg — ABNORMAL LOW (ref 26.0–34.0)
MCH: 23.8 pg — ABNORMAL LOW (ref 26.0–34.0)
MCHC: 32.4 g/dL (ref 30.0–36.0)
MCHC: 32.7 g/dL (ref 30.0–36.0)
MCHC: 33.1 g/dL (ref 30.0–36.0)
MCV: 71.7 fL — ABNORMAL LOW (ref 78.0–100.0)
MCV: 71.7 fL — ABNORMAL LOW (ref 78.0–100.0)
MCV: 73 fL — ABNORMAL LOW (ref 78.0–100.0)
Platelets: 182 10*3/uL (ref 150–400)
Platelets: 183 10*3/uL (ref 150–400)
Platelets: ADEQUATE 10*3/uL (ref 150–400)
RBC: 3.54 MIL/uL — ABNORMAL LOW (ref 4.22–5.81)
RBC: 3.58 MIL/uL — ABNORMAL LOW (ref 4.22–5.81)
RBC: 3.6 MIL/uL — ABNORMAL LOW (ref 4.22–5.81)
RBC: 3.61 MIL/uL — ABNORMAL LOW (ref 4.22–5.81)
RBC: 4.11 MIL/uL — ABNORMAL LOW (ref 4.22–5.81)
RDW: 17.5 % — ABNORMAL HIGH (ref 11.5–15.5)

## 2010-09-08 LAB — CULTURE, BLOOD (ROUTINE X 2)
Culture  Setup Time: 201112130430
Culture: NO GROWTH

## 2010-09-08 LAB — BASIC METABOLIC PANEL
BUN: 30 mg/dL — ABNORMAL HIGH (ref 6–23)
CO2: 20 mEq/L (ref 19–32)
Calcium: 7.4 mg/dL — ABNORMAL LOW (ref 8.4–10.5)
Calcium: 8.3 mg/dL — ABNORMAL LOW (ref 8.4–10.5)
Chloride: 112 mEq/L (ref 96–112)
Creatinine, Ser: 2.99 mg/dL — ABNORMAL HIGH (ref 0.4–1.5)
GFR calc Af Amer: 30 mL/min — ABNORMAL LOW (ref >60)
GFR calc Af Amer: >60 mL/min (ref >60)
GFR calc non Af Amer: 16 mL/min — ABNORMAL LOW (ref >60)
GFR calc non Af Amer: >60 mL/min (ref >60)
Glucose, Bld: 98 mg/dL (ref 70–99)
Potassium: 3.6 mEq/L (ref 3.5–5.1)
Sodium: 142 mEq/L (ref 135–145)

## 2010-09-08 LAB — CARDIAC PANEL(CRET KIN+CKTOT+MB+TROPI)
CK, MB: 25.3 ng/mL (ref 0.3–4.0)
Relative Index: 1 (ref 0.0–2.5)
Total CK: 2428 U/L — ABNORMAL HIGH (ref 7–232)

## 2010-09-08 LAB — TYPE AND SCREEN: Antibody Screen: NEGATIVE

## 2010-09-08 LAB — HEPARIN LEVEL (UNFRACTIONATED)
Heparin Unfractionated: 0.13 IU/mL — ABNORMAL LOW (ref 0.30–0.70)
Heparin Unfractionated: <0.1 IU/mL — ABNORMAL LOW (ref 0.30–0.70)

## 2010-09-08 LAB — HEPATIC FUNCTION PANEL
Albumin: 3.2 g/dL — ABNORMAL LOW (ref 3.5–5.2)
Bilirubin, Direct: 0.4 mg/dL — ABNORMAL HIGH (ref 0.0–0.3)
Indirect Bilirubin: 0.9 mg/dL (ref 0.3–0.9)
Total Bilirubin: 1.3 mg/dL — ABNORMAL HIGH (ref 0.3–1.2)

## 2010-09-08 LAB — MRSA PCR SCREENING: MRSA by PCR: NEGATIVE

## 2010-09-08 LAB — CARBOXYHEMOGLOBIN
O2 Saturation: 58.5 %
Total hemoglobin: 5.8 g/dL — CL (ref 13.5–18.0)

## 2010-09-08 LAB — URINALYSIS, ROUTINE W REFLEX MICROSCOPIC
Ketones, ur: 15 mg/dL — AB
Specific Gravity, Urine: 1.018 (ref 1.005–1.030)
Urobilinogen, UA: 0.2 mg/dL (ref 0.0–1.0)

## 2010-09-08 LAB — ETHANOL: Alcohol, Ethyl (B): <5 mg/dL (ref 0–10)

## 2010-09-08 LAB — COMPREHENSIVE METABOLIC PANEL
AST: 143 U/L — ABNORMAL HIGH (ref 0–37)
Albumin: 2.6 g/dL — ABNORMAL LOW (ref 3.5–5.2)
Calcium: 7.2 mg/dL — ABNORMAL LOW (ref 8.4–10.5)
Creatinine, Ser: 3.9 mg/dL — ABNORMAL HIGH (ref 0.4–1.5)
GFR calc Af Amer: 22 mL/min — ABNORMAL LOW (ref >60)

## 2010-09-08 LAB — CREATININE, URINE, RANDOM: Creatinine, Urine: 123.2 mg/dL

## 2010-09-08 LAB — DRUGS OF ABUSE SCREEN W/O ALC, ROUTINE URINE
Amphetamine Screen, Ur: NEGATIVE
Barbiturate Quant, Ur: NEGATIVE
Creatinine,U: 79.1 mg/dL
Marijuana Metabolite: POSITIVE — AB
Methadone: NEGATIVE

## 2010-09-08 LAB — NA AND K (SODIUM & POTASSIUM), RAND UR
Potassium Urine: 37 mEq/L
Sodium, Ur: 98 mEq/L

## 2010-09-08 LAB — URINE CULTURE: Colony Count: >=100000

## 2010-09-08 LAB — LIPASE, BLOOD: Lipase: 11 U/L (ref 11–59)

## 2010-09-08 LAB — CK: Total CK: 909 U/L — ABNORMAL HIGH (ref 7–232)

## 2010-09-08 LAB — GLUCOSE, CAPILLARY: Glucose-Capillary: 98 mg/dL (ref 70–99)

## 2010-09-08 LAB — PROCALCITONIN: Procalcitonin: >200 ng/mL

## 2010-09-08 LAB — URINE MICROSCOPIC-ADD ON

## 2010-09-08 LAB — APTT: aPTT: 58 seconds — ABNORMAL HIGH (ref 24–37)

## 2010-09-08 LAB — LACTIC ACID, PLASMA: Lactic Acid, Venous: 4 mmol/L — ABNORMAL HIGH (ref 0.5–2.2)

## 2010-09-08 LAB — D-DIMER, QUANTITATIVE: D-Dimer, Quant: >20 ug/mL-FEU — ABNORMAL HIGH (ref 0.00–0.48)

## 2010-09-08 NOTE — Medication Information (Signed)
Summary: rov/tm rs missed appt/mt  Anticoagulant Therapy  Managed by: Bayard Hugger, PharmD PCP: Jacques Navy MD Supervising MD: Graciela Husbands MD, Viviann Spare Indication 1: DVT/PE (first episode) Lab Used: [facility] [place] Site: [facility] INR POC 3.6 INR RANGE 2.0-3.0  Dietary changes: no    Health status changes: no    Bleeding/hemorrhagic complications: no    Recent/future hospitalizations: no    Any changes in medication regimen? no    Recent/future dental: no  Any missed doses?: no       Is patient compliant with meds? yes       Allergies: No Known Drug Allergies  Anticoagulation Management History:      Negative risk factors for bleeding include an age less than 39 years old.  The bleeding index is 'low risk'.  Negative CHADS2 values include Age > 49 years old.  His last INR was 7.3 ratio.  Anticoagulation responsible provider: Graciela Husbands MD, Viviann Spare.  INR POC: 3.6.  Cuvette Lot#: [id number].  Exp: 06/2011.    Anticoagulation Management Assessment/Plan:      The patient's current anticoagulation dose is Coumadin 5 mg tabs: as directed.  The target INR is 2.0-3.0.  The next INR is due 09/16/2010.  Anticoagulation instructions were given to patient.  Results were reviewed/authorized by Bayard Hugger, PharmD.         Prior Anticoagulation Instructions: INR 3.3  Do not take Coumadin today. Resume taking 2 tablets daily except 1 tablet on Mondays, Wednesdays and Fridays. Patient is to increase amount of dark greens he eats. Recheck INR in 4 weeks.   Current Anticoagulation Instructions: INR 3.6 No coumadin today, then take 1 tablet on Thursday instead of 2 tablets. Resume current schedule from Friday: 2 tablets on Sun, Tue, Thu, and Sat 1 tablet on Mon, Wed, and Fri  Recheck INR in 2 weeks.

## 2010-09-10 ENCOUNTER — Ambulatory Visit: Payer: BC Managed Care – PPO | Admitting: Internal Medicine

## 2010-09-11 LAB — PROTIME-INR: INR: 5.03 (ref 0.00–1.49)

## 2010-09-11 LAB — CBC
Hemoglobin: 11.1 g/dL — ABNORMAL LOW (ref 13.0–17.0)
MCH: 25.5 pg — ABNORMAL LOW (ref 26.0–34.0)
MCHC: 33 g/dL (ref 30.0–36.0)
MCV: 77.1 fL — ABNORMAL LOW (ref 78.0–100.0)

## 2010-09-11 LAB — URINE MICROSCOPIC-ADD ON

## 2010-09-11 LAB — BASIC METABOLIC PANEL
CO2: 27 mEq/L (ref 19–32)
Calcium: 9.1 mg/dL (ref 8.4–10.5)
Chloride: 99 mEq/L (ref 96–112)
Creatinine, Ser: 0.74 mg/dL (ref 0.4–1.5)
Glucose, Bld: 108 mg/dL — ABNORMAL HIGH (ref 70–99)
Sodium: 133 mEq/L — ABNORMAL LOW (ref 135–145)

## 2010-09-11 LAB — URINALYSIS, ROUTINE W REFLEX MICROSCOPIC
Glucose, UA: NEGATIVE mg/dL
Ketones, ur: 40 mg/dL — AB
Specific Gravity, Urine: 1.015 (ref 1.005–1.030)
pH: 6 (ref 5.0–8.0)

## 2010-09-11 LAB — URINE CULTURE: Culture  Setup Time: 201108300904

## 2010-09-11 LAB — DIFFERENTIAL
Basophils Relative: 0 % (ref 0–1)
Eosinophils Absolute: 0.3 10*3/uL (ref 0.0–0.7)
Eosinophils Relative: 2 % (ref 0–5)
Lymphs Abs: 1.3 10*3/uL (ref 0.7–4.0)
Monocytes Absolute: 1.1 10*3/uL — ABNORMAL HIGH (ref 0.1–1.0)
Monocytes Relative: 10 % (ref 3–12)
Neutrophils Relative %: 77 % (ref 43–77)

## 2010-09-13 LAB — CBC
HCT: 38.1 % — ABNORMAL LOW (ref 39.0–52.0)
Hemoglobin: 12.8 g/dL — ABNORMAL LOW (ref 13.0–17.0)
MCHC: 33.5 g/dL (ref 30.0–36.0)
MCV: 80 fL (ref 78.0–100.0)
RDW: 18.2 % — ABNORMAL HIGH (ref 11.5–15.5)

## 2010-09-13 LAB — BASIC METABOLIC PANEL
BUN: 8 mg/dL (ref 6–23)
Chloride: 100 mEq/L (ref 96–112)
GFR calc non Af Amer: >60 mL/min (ref >60)
Glucose, Bld: 90 mg/dL (ref 70–99)
Potassium: 3.2 mEq/L — ABNORMAL LOW (ref 3.5–5.1)
Sodium: 135 mEq/L (ref 135–145)

## 2010-09-13 LAB — PROTIME-INR: INR: 2 — ABNORMAL HIGH (ref 0.00–1.49)

## 2010-09-13 LAB — APTT: aPTT: 36 seconds (ref 24–37)

## 2010-09-14 ENCOUNTER — Ambulatory Visit: Payer: BC Managed Care – PPO | Admitting: Internal Medicine

## 2010-09-14 LAB — CBC
HCT: 34.3 % — ABNORMAL LOW (ref 39.0–52.0)
HCT: 36 % — ABNORMAL LOW (ref 39.0–52.0)
HCT: 37.1 % — ABNORMAL LOW (ref 39.0–52.0)
HCT: 37.5 % — ABNORMAL LOW (ref 39.0–52.0)
HCT: 37.9 % — ABNORMAL LOW (ref 39.0–52.0)
HCT: 40.7 % (ref 39.0–52.0)
Hemoglobin: 12.1 g/dL — ABNORMAL LOW (ref 13.0–17.0)
Hemoglobin: 12.2 g/dL — ABNORMAL LOW (ref 13.0–17.0)
MCHC: 32.8 g/dL (ref 30.0–36.0)
MCHC: 32.9 g/dL (ref 30.0–36.0)
MCHC: 33.1 g/dL (ref 30.0–36.0)
MCHC: 33.4 g/dL (ref 30.0–36.0)
MCV: 86.4 fL (ref 78.0–100.0)
MCV: 87 fL (ref 78.0–100.0)
MCV: 87.1 fL (ref 78.0–100.0)
MCV: 87.5 fL (ref 78.0–100.0)
MCV: 87.5 fL (ref 78.0–100.0)
MCV: 88.2 fL (ref 78.0–100.0)
Platelets: 298 10*3/uL (ref 150–400)
Platelets: 307 10*3/uL (ref 150–400)
RBC: 3.94 MIL/uL — ABNORMAL LOW (ref 4.22–5.81)
RBC: 4.16 MIL/uL — ABNORMAL LOW (ref 4.22–5.81)
RBC: 4.21 MIL/uL — ABNORMAL LOW (ref 4.22–5.81)
RBC: 4.31 MIL/uL (ref 4.22–5.81)
RBC: 4.33 MIL/uL (ref 4.22–5.81)
WBC: 6 10*3/uL (ref 4.0–10.5)
WBC: 6 10*3/uL (ref 4.0–10.5)
WBC: 6.9 10*3/uL (ref 4.0–10.5)
WBC: 7 10*3/uL (ref 4.0–10.5)
WBC: 8.3 10*3/uL (ref 4.0–10.5)

## 2010-09-14 LAB — PROTIME-INR: Prothrombin Time: 22.6 seconds — ABNORMAL HIGH (ref 11.6–15.2)

## 2010-09-15 ENCOUNTER — Ambulatory Visit (INDEPENDENT_AMBULATORY_CARE_PROVIDER_SITE_OTHER): Payer: BC Managed Care – PPO | Admitting: Internal Medicine

## 2010-09-15 VITALS — BP 138/78 | HR 98 | Temp 98.4°F

## 2010-09-15 DIAGNOSIS — N390 Urinary tract infection, site not specified: Secondary | ICD-10-CM

## 2010-09-15 LAB — BASIC METABOLIC PANEL
BUN: 4 mg/dL — ABNORMAL LOW (ref 6–23)
BUN: 4 mg/dL — ABNORMAL LOW (ref 6–23)
CO2: 27 mEq/L (ref 19–32)
CO2: 29 mEq/L (ref 19–32)
Calcium: 9 mg/dL (ref 8.4–10.5)
Chloride: 102 mEq/L (ref 96–112)
Chloride: 94 mEq/L — ABNORMAL LOW (ref 96–112)
Creatinine, Ser: 0.86 mg/dL (ref 0.4–1.5)
GFR calc Af Amer: >60 mL/min (ref >60)
GFR calc Af Amer: >60 mL/min (ref >60)
GFR calc non Af Amer: >60 mL/min (ref >60)
Potassium: 3.3 mEq/L — ABNORMAL LOW (ref 3.5–5.1)
Potassium: 3.3 mEq/L — ABNORMAL LOW (ref 3.5–5.1)
Sodium: 133 mEq/L — ABNORMAL LOW (ref 135–145)

## 2010-09-15 LAB — URINE MICROSCOPIC-ADD ON

## 2010-09-15 LAB — CBC
HCT: 38.4 % — ABNORMAL LOW (ref 39.0–52.0)
HCT: 47.6 % (ref 39.0–52.0)
Hemoglobin: 12.8 g/dL — ABNORMAL LOW (ref 13.0–17.0)
Hemoglobin: 14 g/dL (ref 13.0–17.0)
Hemoglobin: 14.6 g/dL (ref 13.0–17.0)
Hemoglobin: 15.9 g/dL (ref 13.0–17.0)
MCHC: 33 g/dL (ref 30.0–36.0)
MCHC: 33.4 g/dL (ref 30.0–36.0)
MCHC: 33.4 g/dL (ref 30.0–36.0)
MCV: 87.8 fL (ref 78.0–100.0)
MCV: 88 fL (ref 78.0–100.0)
Platelets: 213 10*3/uL (ref 150–400)
Platelets: 228 10*3/uL (ref 150–400)
RBC: 4.36 MIL/uL (ref 4.22–5.81)
RBC: 4.78 MIL/uL (ref 4.22–5.81)
RBC: 5.02 MIL/uL (ref 4.22–5.81)
RBC: 5.41 MIL/uL (ref 4.22–5.81)
WBC: 10.5 10*3/uL (ref 4.0–10.5)
WBC: 13 10*3/uL — ABNORMAL HIGH (ref 4.0–10.5)
WBC: 9.1 10*3/uL (ref 4.0–10.5)

## 2010-09-15 LAB — DIFFERENTIAL
Basophils Relative: 1 % (ref 0–1)
Monocytes Absolute: 1.2 10*3/uL — ABNORMAL HIGH (ref 0.1–1.0)
Monocytes Relative: 9 % (ref 3–12)
Neutro Abs: 10 10*3/uL — ABNORMAL HIGH (ref 1.7–7.7)

## 2010-09-15 LAB — URINALYSIS, ROUTINE W REFLEX MICROSCOPIC
Glucose, UA: NEGATIVE mg/dL
Ketones, ur: 40 mg/dL — AB
pH: 5.5 (ref 5.0–8.0)

## 2010-09-15 LAB — MRSA PCR SCREENING: MRSA by PCR: NEGATIVE

## 2010-09-15 LAB — GLUCOSE, CAPILLARY: Glucose-Capillary: 190 mg/dL — ABNORMAL HIGH (ref 70–99)

## 2010-09-15 LAB — HEPARIN LEVEL (UNFRACTIONATED)
Heparin Unfractionated: 0.26 IU/mL — ABNORMAL LOW (ref 0.30–0.70)
Heparin Unfractionated: 0.6 IU/mL (ref 0.30–0.70)
Heparin Unfractionated: 0.91 IU/mL — ABNORMAL HIGH (ref 0.30–0.70)

## 2010-09-15 LAB — ANA: Anti Nuclear Antibody(ANA): NEGATIVE

## 2010-09-15 LAB — FACTOR 5 LEIDEN

## 2010-09-15 LAB — PROTIME-INR: INR: 1.23 (ref 0.00–1.49)

## 2010-09-15 MED ORDER — CEFUROXIME AXETIL 250 MG PO TABS: 250.0000 mg | ORAL_TABLET | Freq: Two times a day (BID) | ORAL | Status: DC

## 2010-09-15 MED ORDER — CEFPROZIL 250 MG PO TABS: 250.0000 mg | ORAL_TABLET | Freq: Two times a day (BID) | ORAL | Status: DC
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
## 2010-09-15 NOTE — Patient Instructions (Signed)
ONly take antibiotics for a definite infection. Technique with in and out cath is extremely important

## 2010-09-16 ENCOUNTER — Encounter: Payer: BC Managed Care – PPO | Admitting: *Deleted

## 2010-09-18 ENCOUNTER — Encounter: Payer: BC Managed Care – PPO | Admitting: *Deleted

## 2010-09-21 ENCOUNTER — Encounter: Payer: BC Managed Care – PPO | Admitting: *Deleted

## 2010-09-29 ENCOUNTER — Ambulatory Visit (INDEPENDENT_AMBULATORY_CARE_PROVIDER_SITE_OTHER): Payer: BC Managed Care – PPO | Admitting: *Deleted

## 2010-09-29 DIAGNOSIS — I2699 Other pulmonary embolism without acute cor pulmonale: Secondary | ICD-10-CM

## 2010-09-29 DIAGNOSIS — I80299 Phlebitis and thrombophlebitis of other deep vessels of unspecified lower extremity: Secondary | ICD-10-CM

## 2010-09-29 DIAGNOSIS — Z7901 Long term (current) use of anticoagulants: Secondary | ICD-10-CM | POA: Insufficient documentation

## 2010-09-29 NOTE — Patient Instructions (Signed)
Skip today's dosage of Coumadin, then start taking 1 tablet daily except 2 tablets on Tuesdays, Thursdays, and Saturdays.  Recheck in 10 days.

## 2010-10-12 ENCOUNTER — Encounter: Payer: BC Managed Care – PPO | Admitting: *Deleted

## 2010-10-15 ENCOUNTER — Ambulatory Visit (INDEPENDENT_AMBULATORY_CARE_PROVIDER_SITE_OTHER): Payer: BC Managed Care – PPO | Admitting: *Deleted

## 2010-10-15 DIAGNOSIS — I80299 Phlebitis and thrombophlebitis of other deep vessels of unspecified lower extremity: Secondary | ICD-10-CM

## 2010-10-15 DIAGNOSIS — I2699 Other pulmonary embolism without acute cor pulmonale: Secondary | ICD-10-CM

## 2010-10-15 LAB — POCT INR: INR: 4.8

## 2010-10-29 ENCOUNTER — Encounter: Payer: BC Managed Care – PPO | Admitting: *Deleted

## 2010-11-05 ENCOUNTER — Ambulatory Visit (INDEPENDENT_AMBULATORY_CARE_PROVIDER_SITE_OTHER): Payer: BC Managed Care – PPO | Admitting: *Deleted

## 2010-11-05 DIAGNOSIS — I80299 Phlebitis and thrombophlebitis of other deep vessels of unspecified lower extremity: Secondary | ICD-10-CM

## 2010-11-05 DIAGNOSIS — I2699 Other pulmonary embolism without acute cor pulmonale: Secondary | ICD-10-CM
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
## 2010-11-26 ENCOUNTER — Encounter: Payer: BC Managed Care – PPO | Admitting: *Deleted

## 2010-11-30 ENCOUNTER — Encounter: Payer: BC Managed Care – PPO | Admitting: *Deleted

## 2010-12-03 ENCOUNTER — Ambulatory Visit (INDEPENDENT_AMBULATORY_CARE_PROVIDER_SITE_OTHER): Payer: BC Managed Care – PPO | Admitting: *Deleted

## 2010-12-03 DIAGNOSIS — I80299 Phlebitis and thrombophlebitis of other deep vessels of unspecified lower extremity: Secondary | ICD-10-CM

## 2010-12-03 DIAGNOSIS — I2699 Other pulmonary embolism without acute cor pulmonale: Secondary | ICD-10-CM

## 2010-12-03 LAB — POCT INR: INR: 4

## 2010-12-16 ENCOUNTER — Other Ambulatory Visit: Payer: Self-pay | Admitting: Internal Medicine

## 2010-12-17 NOTE — Telephone Encounter (Signed)
Please Advise refill 

## 2010-12-17 NOTE — Telephone Encounter (Signed)
Ok for refill  x3 

## 2010-12-21 NOTE — Telephone Encounter (Signed)
Rx Done . 

## 2010-12-22 ENCOUNTER — Other Ambulatory Visit: Payer: Self-pay | Admitting: Internal Medicine

## 2011-01-04 ENCOUNTER — Ambulatory Visit (INDEPENDENT_AMBULATORY_CARE_PROVIDER_SITE_OTHER): Payer: BC Managed Care – PPO | Admitting: *Deleted

## 2011-01-04 DIAGNOSIS — I2699 Other pulmonary embolism without acute cor pulmonale: Secondary | ICD-10-CM

## 2011-01-25 ENCOUNTER — Encounter: Payer: BC Managed Care – PPO | Admitting: *Deleted

## 2011-01-29 ENCOUNTER — Encounter: Payer: BC Managed Care – PPO | Admitting: *Deleted

## 2011-02-02 ENCOUNTER — Emergency Department (HOSPITAL_COMMUNITY)
Admission: EM | Admit: 2011-02-02 | Discharge: 2011-02-03 | Disposition: A | Discharge status: Home / Self Care | Payer: BC Managed Care – PPO | Attending: Emergency Medicine | Admitting: Emergency Medicine

## 2011-02-02 DIAGNOSIS — M7989 Other specified soft tissue disorders: Secondary | ICD-10-CM | POA: Insufficient documentation

## 2011-02-02 DIAGNOSIS — L299 Pruritus, unspecified: Secondary | ICD-10-CM | POA: Insufficient documentation

## 2011-02-02 DIAGNOSIS — M79609 Pain in unspecified limb: Secondary | ICD-10-CM | POA: Insufficient documentation

## 2011-02-02 DIAGNOSIS — L02519 Cutaneous abscess of unspecified hand: Secondary | ICD-10-CM | POA: Insufficient documentation

## 2011-02-02 DIAGNOSIS — Z86711 Personal history of pulmonary embolism: Secondary | ICD-10-CM | POA: Insufficient documentation

## 2011-02-02 DIAGNOSIS — Z7901 Long term (current) use of anticoagulants: Secondary | ICD-10-CM | POA: Insufficient documentation

## 2011-02-02 LAB — PROTIME-INR
INR: 3.15 — ABNORMAL HIGH (ref 0.00–1.49)
Prothrombin Time: 32.8 s — ABNORMAL HIGH (ref 11.6–15.2)

## 2011-03-03 ENCOUNTER — Encounter (INDEPENDENT_AMBULATORY_CARE_PROVIDER_SITE_OTHER): Payer: BC Managed Care – PPO | Admitting: *Deleted

## 2011-03-03 DIAGNOSIS — I80299 Phlebitis and thrombophlebitis of other deep vessels of unspecified lower extremity: Secondary | ICD-10-CM

## 2011-03-03 DIAGNOSIS — I2699 Other pulmonary embolism without acute cor pulmonale: Secondary | ICD-10-CM

## 2011-03-12 ENCOUNTER — Encounter: Payer: BC Managed Care – PPO | Admitting: *Deleted

## 2011-03-16 ENCOUNTER — Other Ambulatory Visit: Payer: Self-pay | Admitting: Internal Medicine

## 2011-03-16 NOTE — Telephone Encounter (Signed)
Done hardcopy to dahlia/LIM B  

## 2011-03-16 NOTE — Telephone Encounter (Signed)
Please Advise refill in Dr Debby Bud absence

## 2011-03-17 ENCOUNTER — Encounter: Payer: BC Managed Care – PPO | Admitting: *Deleted

## 2011-03-18 ENCOUNTER — Other Ambulatory Visit: Payer: Self-pay | Admitting: Internal Medicine

## 2011-03-18 NOTE — Telephone Encounter (Signed)
Please advise for Dr Debby Bud, [facility]

## 2011-03-19 MED ORDER — ALPRAZOLAM 1 MG PO TABS: 1.0000 mg | ORAL_TABLET | Freq: Three times a day (TID) | ORAL | Status: DC | PRN

## 2011-03-19 NOTE — Telephone Encounter (Signed)
Alprazolam Rx refilled per [facility] 03/19/2011

## 2011-03-19 NOTE — Telephone Encounter (Signed)
Cant tell what the request is for;  Please clarify

## 2011-03-30 ENCOUNTER — Other Ambulatory Visit: Payer: Self-pay | Admitting: *Deleted

## 2011-03-30 MED ORDER — SILDENAFIL CITRATE 100 MG PO TABS: 100.0000 mg | ORAL_TABLET | Freq: Every day | ORAL | Status: DC | PRN

## 2011-03-31 ENCOUNTER — Ambulatory Visit (INDEPENDENT_AMBULATORY_CARE_PROVIDER_SITE_OTHER): Payer: BC Managed Care – PPO | Admitting: *Deleted

## 2011-03-31 DIAGNOSIS — I2699 Other pulmonary embolism without acute cor pulmonale: Secondary | ICD-10-CM

## 2011-05-03 ENCOUNTER — Encounter: Payer: BC Managed Care – PPO | Admitting: *Deleted

## 2011-05-05 ENCOUNTER — Ambulatory Visit (INDEPENDENT_AMBULATORY_CARE_PROVIDER_SITE_OTHER): Payer: BC Managed Care – PPO | Admitting: Internal Medicine

## 2011-05-05 DIAGNOSIS — N390 Urinary tract infection, site not specified: Secondary | ICD-10-CM

## 2011-05-05 DIAGNOSIS — R030 Elevated blood-pressure reading, without diagnosis of hypertension: Secondary | ICD-10-CM

## 2011-05-05 DIAGNOSIS — F1010 Alcohol abuse, uncomplicated: Secondary | ICD-10-CM

## 2011-05-05 MED ORDER — CEFUROXIME AXETIL 250 MG PO TABS: ORAL_TABLET | ORAL | Status: DC

## 2011-05-05 MED ORDER — CIPROFLOXACIN HCL 250 MG PO TABS: 250.0000 mg | ORAL_TABLET | ORAL | Status: DC | PRN

## 2011-05-05 MED ORDER — ALPRAZOLAM 1 MG PO TABS: 1.0000 mg | ORAL_TABLET | Freq: Four times a day (QID) | ORAL | Status: DC

## 2011-05-05 NOTE — Progress Notes (Signed)
  Subjective:    Patient ID: John Figueroa, male    DOB: 02/10/1981, 30 y.o.   MRN: [id number]  HPI Mr. Ging is doing well in the main. He is still drinking at times. He has had a problem with dark urine, strong odor. He has a history of UTI's due to I&O cath secondary to neurogenic bladder.  I have reviewed the patient's medical history in detail and updated the computerized patient record.    Review of Systems System review is negative for any constitutional, cardiac, pulmonary, GI or neuro symptoms or complaints other than as described in the HPI.     Objective:   Physical Exam Vitals noted - BP very high -question of medication adherence Gen'l - Large frame AA man in no distress, in his w/c HEENT- C&S clear Pulm - normal respirations Cor- RRR      Assessment & Plan:

## 2011-05-06 NOTE — Assessment & Plan Note (Signed)
Brief discussion of dangers of continued and excessive EtOH use. He acknowledges that this can be an addiction.

## 2011-05-06 NOTE — Assessment & Plan Note (Signed)
Very high BP today- no symptoms  Plan - patient to monitor BP at home and report back.

## 2011-05-06 NOTE — Assessment & Plan Note (Signed)
Chronic recurrent problem secondary to  I&O cath.  Plan - provided with Rx's for cipro and if he should have poor response there are Rxs for ceftin on file.

## 2011-05-07 ENCOUNTER — Telehealth: Payer: Self-pay | Admitting: *Deleted

## 2011-05-07 NOTE — Telephone Encounter (Signed)
Called pt to inform him that he needs to keep check on his BP and keep a record for Dr Debby Bud

## 2011-05-25 ENCOUNTER — Other Ambulatory Visit: Payer: Self-pay | Admitting: Internal Medicine

## 2011-05-25 NOTE — Telephone Encounter (Signed)
Pharmacy faxed refill request on  zolpidem (AMBIEN) 10 MG tablet [Pharmacy Med Name: ZOLPIDEM TARTRATE 10 MG TABLET] take 1 tablet by mouth at bedtime for sleep if needed Disp: 30 tablet R: 1 Start: 05/25/2011.  OK to refill.

## 2011-05-27 NOTE — Telephone Encounter (Signed)
Faxed Rx to pharmacy  

## 2011-08-26 ENCOUNTER — Other Ambulatory Visit: Payer: Self-pay | Admitting: Internal Medicine

## 2011-08-27 ENCOUNTER — Telehealth: Payer: Self-pay | Admitting: *Deleted

## 2011-08-27 NOTE — Telephone Encounter (Signed)
Paperwork faxed/SLS 

## 2011-08-27 NOTE — Telephone Encounter (Signed)
[employer] Order paperwork for patient catheter supply needs to be completed & faxed back. [Cannot reach caller at either number given/only echoes what you are saying in phone?]

## 2011-08-27 NOTE — Telephone Encounter (Signed)
done

## 2011-09-13 ENCOUNTER — Telehealth: Payer: Self-pay

## 2011-09-13 NOTE — Telephone Encounter (Signed)
Pt called stating that he has a dentist appt 03/23 for cleaning and would like to know when he needs to when and how long to HOLD blood thinner?

## 2011-09-13 NOTE — Telephone Encounter (Signed)
Does not need to hold "blood thinner" for routine cleaning

## 2011-09-14 NOTE — Telephone Encounter (Signed)
Pt advised and expressed understanding.

## 2011-09-15 ENCOUNTER — Other Ambulatory Visit: Payer: Self-pay | Admitting: Internal Medicine

## 2011-10-18 ENCOUNTER — Other Ambulatory Visit: Payer: Self-pay | Admitting: Internal Medicine

## 2011-10-20 ENCOUNTER — Other Ambulatory Visit: Payer: Self-pay | Admitting: Internal Medicine

## 2011-10-20 NOTE — Telephone Encounter (Signed)
Rx faxed to pharmacy  

## 2011-11-15 ENCOUNTER — Other Ambulatory Visit: Payer: Self-pay | Admitting: Internal Medicine

## 2011-11-19 ENCOUNTER — Telehealth: Payer: Self-pay

## 2011-11-19 NOTE — Telephone Encounter (Signed)
Pt called requesting refill of Ceftin and Cipro. Per pt, [facility] has always filled these medication because he has recurrent UTIs. Pt aware MD out of office until 05/28 and is okay to wait for his reply.

## 2011-11-24 NOTE — Telephone Encounter (Signed)
Dr. Debby Bud the previous request for Ceftin and Cipro was denied ... Reason was that the patient needs an appointment . Please advise.

## 2011-11-26 ENCOUNTER — Other Ambulatory Visit: Payer: Self-pay | Admitting: Internal Medicine

## 2011-11-26 ENCOUNTER — Telehealth: Payer: Self-pay | Admitting: *Deleted

## 2011-11-26 NOTE — Telephone Encounter (Signed)
K  For antibiotic refills: paraplegic with neurogenic bladder - will get infections. He is reliable and knows when to use his medications. For persistent fevers, etc he will need a an acute visit.

## 2011-11-26 NOTE — Telephone Encounter (Signed)
PATIENT NOTIFIED OF Rxs SENT TO PHARMACY.

## 2011-11-29 NOTE — Telephone Encounter (Signed)
Called pt already pick both rx's up from pharmacy. Did inform pt of any fever he will need ov.... 11/29/11@11 :34am/LMB

## 2011-11-30 ENCOUNTER — Telehealth: Payer: Self-pay | Admitting: *Deleted

## 2011-11-30 NOTE — Telephone Encounter (Signed)
Patient request refill on med zopidem 10 mg. Last office visit 11/07/21012

## 2011-11-30 NOTE — Telephone Encounter (Signed)
Ok for refill x 5 

## 2011-12-01 ENCOUNTER — Other Ambulatory Visit: Payer: Self-pay | Admitting: *Deleted

## 2011-12-01 MED ORDER — ZOLPIDEM TARTRATE 10 MG PO TABS: 10.0000 mg | ORAL_TABLET | Freq: Every evening | ORAL | Status: DC | PRN

## 2011-12-01 NOTE — Telephone Encounter (Signed)
Refill medication called to [facility] and left on doctor VM. Patient notified of this

## 2012-01-11 ENCOUNTER — Encounter (HOSPITAL_COMMUNITY): Payer: Self-pay | Admitting: Emergency Medicine

## 2012-01-11 ENCOUNTER — Telehealth: Payer: Self-pay | Admitting: Internal Medicine

## 2012-01-11 ENCOUNTER — Emergency Department (HOSPITAL_COMMUNITY)
Admission: EM | Admit: 2012-01-11 | Discharge: 2012-01-12 | Disposition: A | Discharge status: Home / Self Care | Payer: BC Managed Care – PPO | Attending: Emergency Medicine | Admitting: Emergency Medicine

## 2012-01-11 DIAGNOSIS — R791 Abnormal coagulation profile: Secondary | ICD-10-CM | POA: Insufficient documentation

## 2012-01-11 DIAGNOSIS — T83511A Infection and inflammatory reaction due to indwelling urethral catheter, initial encounter: Secondary | ICD-10-CM | POA: Insufficient documentation

## 2012-01-11 DIAGNOSIS — F411 Generalized anxiety disorder: Secondary | ICD-10-CM | POA: Insufficient documentation

## 2012-01-11 DIAGNOSIS — N390 Urinary tract infection, site not specified: Secondary | ICD-10-CM | POA: Insufficient documentation

## 2012-01-11 DIAGNOSIS — Y846 Urinary catheterization as the cause of abnormal reaction of the patient, or of later complication, without mention of misadventure at the time of the procedure: Secondary | ICD-10-CM | POA: Insufficient documentation

## 2012-01-11 DIAGNOSIS — Z7901 Long term (current) use of anticoagulants: Secondary | ICD-10-CM | POA: Insufficient documentation

## 2012-01-11 DIAGNOSIS — Z86718 Personal history of other venous thrombosis and embolism: Secondary | ICD-10-CM | POA: Insufficient documentation

## 2012-01-11 DIAGNOSIS — Z79899 Other long term (current) drug therapy: Secondary | ICD-10-CM | POA: Insufficient documentation

## 2012-01-11 DIAGNOSIS — H109 Unspecified conjunctivitis: Secondary | ICD-10-CM | POA: Insufficient documentation

## 2012-01-11 DIAGNOSIS — G8220 Paraplegia, unspecified: Secondary | ICD-10-CM | POA: Insufficient documentation

## 2012-01-11 LAB — POCT I-STAT, CHEM 8
Calcium, Ion: 1.19 mmol/L (ref 1.12–1.23)
Chloride: 100 mEq/L (ref 96–112)
Glucose, Bld: 88 mg/dL (ref 70–99)
HCT: 49 % (ref 39.0–52.0)
TCO2: 25 mmol/L (ref 0–100)

## 2012-01-11 LAB — CBC WITH DIFFERENTIAL/PLATELET
Basophils Absolute: 0 10*3/uL (ref 0.0–0.1)
Basophils Relative: 1 % (ref 0–1)
Eosinophils Relative: 6 % — ABNORMAL HIGH (ref 0–5)
HCT: 43.2 % (ref 39.0–52.0)
MCHC: 32.9 g/dL (ref 30.0–36.0)
MCV: 75.7 fL — ABNORMAL LOW (ref 78.0–100.0)
Monocytes Absolute: 0.7 10*3/uL (ref 0.1–1.0)
Platelets: 347 10*3/uL (ref 150–400)
RDW: 20.7 % — ABNORMAL HIGH (ref 11.5–15.5)

## 2012-01-11 LAB — URINALYSIS, ROUTINE W REFLEX MICROSCOPIC
Bilirubin Urine: NEGATIVE
Ketones, ur: 15 mg/dL — AB
Nitrite: POSITIVE — AB
Protein, ur: 100 mg/dL — AB
Urobilinogen, UA: 1 mg/dL (ref 0.0–1.0)
pH: 5.5 (ref 5.0–8.0)

## 2012-01-11 LAB — URINE MICROSCOPIC-ADD ON

## 2012-01-11 LAB — PROTIME-INR: Prothrombin Time: 16.3 seconds — ABNORMAL HIGH (ref 11.6–15.2)

## 2012-01-11 NOTE — ED Notes (Signed)
called PCP and was told to come d/t- had blurred vision (for 3 months), and pain in bil eyes, and L side pain; reports pain in lower abd; reports he self caths- and urine is dark and has bad odor

## 2012-01-11 NOTE — Telephone Encounter (Signed)
Caller: John Figueroa/Patient; PCP: Illene Regulus; CB#: [phone]; ; ; Call regarding "I Just Need To Come in and See My Doctor Cause I. Haven T Been Feeling Like I. Usually Feel, I. Have Been Feeling Bad." Eye Pain, Blurred Vision, Dark Urine. Onset "a Long Time But I. Been Trying To Fight It, the Past Couple of Months Has Been Worser and Worser." Afebrile.; Night sweats and sometimes during the day. Mostly at night. Frequent urination with pain. States that pain is unbearable.  All emergent sxs per [facility] protocol r/o. Advised ED per guidelines/[place]. Pt agrees. (1658PM)

## 2012-01-11 NOTE — Telephone Encounter (Signed)
Would have seen in the office acutely. Will see as ED follow-up as needed.

## 2012-01-11 NOTE — ED Notes (Signed)
When asked why he is here pt stated, "I don't really know". Pt. C/o lower abdominal pain and left flank pain that has gotten better since in the ED. States my eyes aren't bothering me yet.

## 2012-01-12 MED ORDER — SULFAMETHOXAZOLE-TMP DS 800-160 MG PO TABS: 1.0000 | ORAL_TABLET | Freq: Two times a day (BID) | ORAL | Status: AC

## 2012-01-12 MED ORDER — SULFAMETHOXAZOLE-TMP DS 800-160 MG PO TABS
1.0000 | ORAL_TABLET | Freq: Once | ORAL | Status: AC
  Administered 2012-01-12: 1 via ORAL
  Filled 2012-01-12: qty 1

## 2012-01-12 MED ORDER — ERYTHROMYCIN 5 MG/GM OP OINT
TOPICAL_OINTMENT | Freq: Once | OPHTHALMIC | Status: AC
  Administered 2012-01-12: 01:00:00 via OPHTHALMIC
  Filled 2012-01-12: qty 1

## 2012-01-12 MED ORDER — ERYTHROMYCIN 5 MG/GM OP OINT: TOPICAL_OINTMENT | Freq: Four times a day (QID) | OPHTHALMIC | Status: AC

## 2012-01-12 NOTE — ED Notes (Signed)
Prescription X2 given with discharge instructions. 
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
## 2012-01-13 LAB — URINE CULTURE: Colony Count: NO GROWTH

## 2012-01-26 NOTE — Telephone Encounter (Signed)
I have left several messages to call for an appt.  No response.

## 2012-01-28 ENCOUNTER — Other Ambulatory Visit: Payer: Self-pay | Admitting: Internal Medicine

## 2012-03-01 ENCOUNTER — Encounter: Payer: Self-pay | Admitting: General Practice

## 2012-03-02 ENCOUNTER — Ambulatory Visit (INDEPENDENT_AMBULATORY_CARE_PROVIDER_SITE_OTHER): Payer: BC Managed Care – PPO | Admitting: *Deleted

## 2012-03-02 DIAGNOSIS — Z7901 Long term (current) use of anticoagulants: Secondary | ICD-10-CM

## 2012-03-02 DIAGNOSIS — I2699 Other pulmonary embolism without acute cor pulmonale: Secondary | ICD-10-CM

## 2012-03-02 DIAGNOSIS — I80299 Phlebitis and thrombophlebitis of other deep vessels of unspecified lower extremity: Secondary | ICD-10-CM

## 2012-04-11 ENCOUNTER — Other Ambulatory Visit: Payer: Self-pay | Admitting: Internal Medicine

## 2012-04-11 NOTE — Telephone Encounter (Signed)
Spoke with patient and he is requesting refills for Coumadin and Xanax. Please advise.

## 2012-04-11 NOTE — Telephone Encounter (Signed)
The patient scheduled a cpe for January.  He was in need of a morning apt, so he was scheduled for the next available am slot.  Can get his meds refilled until his cpe?

## 2012-04-11 NOTE — Telephone Encounter (Signed)
Ok for refills x 3 months

## 2012-04-12 ENCOUNTER — Other Ambulatory Visit: Payer: Self-pay | Admitting: Internal Medicine

## 2012-04-12 NOTE — Telephone Encounter (Signed)
Patient notified of refill on medication sent to [facility] pharmacy.

## 2012-07-06 ENCOUNTER — Encounter: Payer: Self-pay | Admitting: Internal Medicine

## 2012-07-06 ENCOUNTER — Ambulatory Visit (INDEPENDENT_AMBULATORY_CARE_PROVIDER_SITE_OTHER): Payer: BC Managed Care – PPO | Admitting: Internal Medicine

## 2012-07-06 VITALS — BP 150/98 | HR 101 | Temp 98.1°F | Resp 10

## 2012-07-06 DIAGNOSIS — N390 Urinary tract infection, site not specified: Secondary | ICD-10-CM

## 2012-07-06 DIAGNOSIS — F528 Other sexual dysfunction not due to a substance or known physiological condition: Secondary | ICD-10-CM

## 2012-07-06 DIAGNOSIS — G8220 Paraplegia, unspecified: Secondary | ICD-10-CM

## 2012-07-06 DIAGNOSIS — F1010 Alcohol abuse, uncomplicated: Secondary | ICD-10-CM

## 2012-07-06 DIAGNOSIS — Z7901 Long term (current) use of anticoagulants: Secondary | ICD-10-CM

## 2012-07-06 DIAGNOSIS — R030 Elevated blood-pressure reading, without diagnosis of hypertension: Secondary | ICD-10-CM

## 2012-07-06 MED ORDER — ALPRAZOLAM 1 MG PO TABS: 1.0000 mg | ORAL_TABLET | Freq: Every day | ORAL | Status: DC

## 2012-07-06 MED ORDER — WARFARIN SODIUM 5 MG PO TABS: 5.0000 mg | ORAL_TABLET | ORAL | Status: DC

## 2012-07-06 MED ORDER — CEFUROXIME AXETIL 250 MG PO TABS: 250.0000 mg | ORAL_TABLET | Freq: Two times a day (BID) | ORAL | Status: DC

## 2012-07-06 MED ORDER — CIPROFLOXACIN HCL 250 MG PO TABS: 250.0000 mg | ORAL_TABLET | Freq: Two times a day (BID) | ORAL | Status: DC | PRN

## 2012-07-06 MED ORDER — SILDENAFIL CITRATE 100 MG PO TABS: 100.0000 mg | ORAL_TABLET | Freq: Every day | ORAL | Status: DC | PRN

## 2012-07-06 NOTE — Patient Instructions (Addendum)
Doing well. You have had lab work in summer of '13 and do not need lab today.  You need to have your Protime checked - I will have Lodema Pilot, RN call you with an appointment time. It costs a lot less having it done her.  Call for problems.
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
## 2012-07-07 ENCOUNTER — Telehealth: Payer: Self-pay | Admitting: General Practice

## 2012-07-07 NOTE — Telephone Encounter (Signed)
Left message with adult male in the home for pt to call office to schedule INR check.

## 2012-07-08 MED ORDER — LOSARTAN POTASSIUM 50 MG PO TABS: 50.0000 mg | ORAL_TABLET | Freq: Every day | ORAL | Status: DC

## 2012-07-08 NOTE — Assessment & Plan Note (Signed)
Continues to take warfarin but has not been have regular follow up. He claims cost is a factor.  Plan - Refer to [facility] office Coag clinic.

## 2012-07-08 NOTE — Assessment & Plan Note (Signed)
Reports that he is drinking but has it under control.

## 2012-07-08 NOTE — Assessment & Plan Note (Signed)
Stable. May need w/c repair. Does I&O cath. Is very active including driving with hand-control vehicle. Handicap permit provided.

## 2012-07-08 NOTE — Assessment & Plan Note (Signed)
Requested and provided Rx for Viagra - also gave samples

## 2012-07-08 NOTE — Assessment & Plan Note (Signed)
He will have periodic UTI's. He recognizes the signs and keeps Cipro on hand for treatment. He is also provided Rx for Ceftin to use if his symptoms do not rapidly resolve with cipro.

## 2012-07-08 NOTE — Assessment & Plan Note (Signed)
BP Readings from Last 3 Encounters:  07/06/12 150/98  01/11/12 137/83  05/05/11 172/98   Plan- will start ARB, losartan 50 mg daily. Rx to pharmacy. Patient will be called with instructions.

## 2012-07-10 ENCOUNTER — Telehealth: Payer: Self-pay | Admitting: *Deleted

## 2012-07-10 NOTE — Telephone Encounter (Signed)
Called pt and let him know that per Dr Debby Bud, he is to start Losartan 50 mg daily for BP, rx has been sent to pharmacy. Pt understood.

## 2012-07-11 ENCOUNTER — Telehealth: Payer: Self-pay | Admitting: General Practice

## 2012-07-11 NOTE — Telephone Encounter (Signed)
LMOM for pt to call and schedule INR visit.

## 2012-07-11 NOTE — Telephone Encounter (Signed)
Spoke with patient's mother and she is going to get message to patient to call and schedule visit to get INR checked.

## 2012-08-19 ENCOUNTER — Emergency Department (HOSPITAL_COMMUNITY)
Admission: EM | Admit: 2012-08-19 | Discharge: 2012-08-19 | Disposition: A | Discharge status: Home / Self Care | Payer: BC Managed Care – PPO | Attending: Emergency Medicine | Admitting: Emergency Medicine

## 2012-08-19 ENCOUNTER — Encounter (HOSPITAL_COMMUNITY): Payer: Self-pay | Admitting: Emergency Medicine

## 2012-08-19 DIAGNOSIS — F411 Generalized anxiety disorder: Secondary | ICD-10-CM | POA: Insufficient documentation

## 2012-08-19 DIAGNOSIS — Z9889 Other specified postprocedural states: Secondary | ICD-10-CM | POA: Insufficient documentation

## 2012-08-19 DIAGNOSIS — Z8673 Personal history of transient ischemic attack (TIA), and cerebral infarction without residual deficits: Secondary | ICD-10-CM | POA: Insufficient documentation

## 2012-08-19 DIAGNOSIS — S61209A Unspecified open wound of unspecified finger without damage to nail, initial encounter: Secondary | ICD-10-CM | POA: Insufficient documentation

## 2012-08-19 DIAGNOSIS — Z87448 Personal history of other diseases of urinary system: Secondary | ICD-10-CM | POA: Insufficient documentation

## 2012-08-19 DIAGNOSIS — Z87440 Personal history of urinary (tract) infections: Secondary | ICD-10-CM | POA: Insufficient documentation

## 2012-08-19 DIAGNOSIS — Z23 Encounter for immunization: Secondary | ICD-10-CM | POA: Insufficient documentation

## 2012-08-19 DIAGNOSIS — Y9389 Activity, other specified: Secondary | ICD-10-CM | POA: Insufficient documentation

## 2012-08-19 DIAGNOSIS — Z87828 Personal history of other (healed) physical injury and trauma: Secondary | ICD-10-CM | POA: Insufficient documentation

## 2012-08-19 DIAGNOSIS — W268XXA Contact with other sharp object(s), not elsewhere classified, initial encounter: Secondary | ICD-10-CM | POA: Insufficient documentation

## 2012-08-19 DIAGNOSIS — Y929 Unspecified place or not applicable: Secondary | ICD-10-CM | POA: Insufficient documentation

## 2012-08-19 DIAGNOSIS — Z79899 Other long term (current) drug therapy: Secondary | ICD-10-CM | POA: Insufficient documentation

## 2012-08-19 DIAGNOSIS — Z872 Personal history of diseases of the skin and subcutaneous tissue: Secondary | ICD-10-CM | POA: Insufficient documentation

## 2012-08-19 DIAGNOSIS — Z86718 Personal history of other venous thrombosis and embolism: Secondary | ICD-10-CM | POA: Insufficient documentation

## 2012-08-19 DIAGNOSIS — Z7901 Long term (current) use of anticoagulants: Secondary | ICD-10-CM | POA: Insufficient documentation

## 2012-08-19 MED ORDER — TETANUS-DIPHTH-ACELL PERTUSSIS 5-2.5-18.5 LF-MCG/0.5 IM SUSP
0.5000 mL | Freq: Once | INTRAMUSCULAR | Status: AC
  Administered 2012-08-19: 0.5 mL via INTRAMUSCULAR
  Filled 2012-08-19: qty 0.5

## 2012-08-19 NOTE — ED Notes (Signed)
Patient given copy of discharge paperwork; went over discharge instructions with patient.  Patient instructed to follow up with referral within two days and to return to the ED for new, worsening, or concerning symptoms.

## 2012-08-19 NOTE — ED Provider Notes (Signed)
Medical screening examination/treatment/procedure(s) were conducted as a shared visit with non-physician practitioner(s) and myself.  I personally evaluated the patient during the encounter    Joya Gaskins, MD 08/19/12 2328

## 2012-08-19 NOTE — ED Notes (Addendum)
Patient complaining of left index finger laceration/abrasion.  When ask patient how it occurred, patient reports "I forget".  Patient does not elaborate on incident.  No active bleeding at this time; clean, sterile gauze given.  Patient reports, "Ten ten ten pain pain everywhere.  I need an antibiotic or something".
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
## 2012-08-19 NOTE — ED Notes (Signed)
Patient states that he is still unsure how he got lacerations/abrasions on left index finger.  Patient reports 10/10 pain; requesting antibiotics.  Patient able to move upper extremities without any difficulty.  Patient alert and oriented x4; PERRL present.  Will continue to monitor.

## 2012-09-12 ENCOUNTER — Ambulatory Visit: Payer: Self-pay | Admitting: Internal Medicine

## 2012-09-21 ENCOUNTER — Telehealth: Payer: Self-pay | Admitting: Internal Medicine

## 2012-09-21 DIAGNOSIS — G8220 Paraplegia, unspecified: Secondary | ICD-10-CM

## 2012-09-21 NOTE — Telephone Encounter (Signed)
I spoke with [facility] p [phone] f [phone].  Pt is needing a new wheelchair and since he has a different insurance documentation is needed to support why he needs the wheelchair. So what needs to be faxed in is rx for wheelchair with diagnosis and doctor NPI #. Progress notes including why he can not ambulate with a cane or a walker. Ht wt and demographics is needed on pt also.

## 2012-09-21 NOTE — Telephone Encounter (Signed)
Patient is requesting parts for his wheel chair from [facility] and he needs an order from his PCP, wants to speak with assistant

## 2012-09-21 NOTE — Telephone Encounter (Signed)
Ok for any part for w/c that he needs. Usually we give a verbal and then the repair people send the paperwork

## 2012-09-22 NOTE — Telephone Encounter (Signed)
DME Rx and office note done and at your desk.

## 2012-09-25 NOTE — Telephone Encounter (Signed)
I called and let pt know I am faxing information to [facility] regarding his wheelchair. He expressed understanding and had no questions/concerns.

## 2012-09-29 ENCOUNTER — Telehealth: Payer: Self-pay

## 2012-09-29 NOTE — Telephone Encounter (Signed)
Phone call to pt to let him know I have been in contact with [facility] and I am resending the information that was faxed on 09/25/12 since for some reason it was not received. He did not have any questions.

## 2012-09-29 NOTE — Telephone Encounter (Signed)
[facility] called John Figueroa) stated that John Figueroa need an OV with Dr. Debby Bud for wheelchair evaluation, No appt open, can we work him in next week or he can see someone else to get this done? Please advise.  [facility] # [phone].

## 2012-09-29 NOTE — Telephone Encounter (Signed)
Phone call to [facility] [phone]. Additional information form is being faxed. The information I faxed on 09/25/12 was not received so I will be faxing that again.

## 2012-09-29 NOTE — Telephone Encounter (Signed)
OK - may work in any time Mon - Thurs next week. Thnks

## 2012-09-29 NOTE — Telephone Encounter (Signed)
Pt called stating that [facility] contacted him and advised that a request for additional information regarding wheelchair was sent to our office and they are awaiting reply. Please contact pt or [facility] for further information.

## 2012-10-02 NOTE — Telephone Encounter (Signed)
Per Dr Debby Bud please work pt in

## 2012-10-05 ENCOUNTER — Encounter: Payer: Self-pay | Admitting: Internal Medicine

## 2012-10-05 ENCOUNTER — Ambulatory Visit (INDEPENDENT_AMBULATORY_CARE_PROVIDER_SITE_OTHER): Payer: BC Managed Care – PPO | Admitting: Internal Medicine

## 2012-10-05 VITALS — BP 134/86 | HR 71 | Temp 98.3°F | Resp 12 | Wt 200.0 lb

## 2012-10-05 DIAGNOSIS — G8220 Paraplegia, unspecified: Secondary | ICD-10-CM
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
## 2012-10-08 NOTE — Assessment & Plan Note (Signed)
Paraplegic patient in need of replacement wheel chair: light weight high strength is recommended to maximize his independence in ADLs.

## 2012-10-09 ENCOUNTER — Other Ambulatory Visit: Payer: Self-pay | Admitting: Internal Medicine

## 2012-10-09 DIAGNOSIS — G8220 Paraplegia, unspecified: Secondary | ICD-10-CM

## 2012-10-10 ENCOUNTER — Telehealth: Payer: Self-pay

## 2012-10-10 NOTE — Telephone Encounter (Signed)
Documentation has been faxed to [facility] [phone]) regarding high strength lightweight wheelchair

## 2012-11-04 ENCOUNTER — Other Ambulatory Visit: Payer: Self-pay | Admitting: Internal Medicine

## 2012-11-09 ENCOUNTER — Ambulatory Visit: Payer: BC Managed Care – PPO | Admitting: Physical Therapy

## 2012-11-21 ENCOUNTER — Ambulatory Visit (INDEPENDENT_AMBULATORY_CARE_PROVIDER_SITE_OTHER): Payer: BC Managed Care – PPO | Admitting: Family Medicine

## 2012-11-21 DIAGNOSIS — I2699 Other pulmonary embolism without acute cor pulmonale: Secondary | ICD-10-CM

## 2012-11-21 DIAGNOSIS — Z7901 Long term (current) use of anticoagulants: Secondary | ICD-10-CM

## 2012-11-21 DIAGNOSIS — I80299 Phlebitis and thrombophlebitis of other deep vessels of unspecified lower extremity: Secondary | ICD-10-CM

## 2012-11-23 ENCOUNTER — Ambulatory Visit: Payer: BC Managed Care – PPO | Admitting: Rehabilitative and Restorative Service Providers"

## 2012-11-28 ENCOUNTER — Ambulatory Visit (INDEPENDENT_AMBULATORY_CARE_PROVIDER_SITE_OTHER): Payer: BC Managed Care – PPO | Admitting: Family Medicine

## 2012-11-28 DIAGNOSIS — Z7901 Long term (current) use of anticoagulants: Secondary | ICD-10-CM

## 2012-11-28 DIAGNOSIS — I2699 Other pulmonary embolism without acute cor pulmonale: Secondary | ICD-10-CM

## 2012-11-28 DIAGNOSIS — I80299 Phlebitis and thrombophlebitis of other deep vessels of unspecified lower extremity: Secondary | ICD-10-CM

## 2012-11-30 ENCOUNTER — Ambulatory Visit
Payer: BC Managed Care – PPO | Attending: Internal Medicine | Admitting: Rehabilitative and Restorative Service Providers"

## 2012-11-30 DIAGNOSIS — IMO0001 Reserved for inherently not codable concepts without codable children: Secondary | ICD-10-CM | POA: Insufficient documentation

## 2012-11-30 DIAGNOSIS — M6281 Muscle weakness (generalized): Secondary | ICD-10-CM | POA: Insufficient documentation

## 2013-01-03 ENCOUNTER — Other Ambulatory Visit: Payer: Self-pay | Admitting: Internal Medicine

## 2013-01-04 NOTE — Telephone Encounter (Signed)
Alprazolam called to pharmacy  

## 2013-01-08 ENCOUNTER — Other Ambulatory Visit: Payer: BC Managed Care – PPO

## 2013-01-08 ENCOUNTER — Encounter: Payer: Self-pay | Admitting: Internal Medicine

## 2013-01-08 ENCOUNTER — Ambulatory Visit (INDEPENDENT_AMBULATORY_CARE_PROVIDER_SITE_OTHER): Payer: BC Managed Care – PPO | Admitting: Internal Medicine

## 2013-01-08 VITALS — BP 148/90 | HR 90 | Temp 97.8°F

## 2013-01-08 DIAGNOSIS — G8220 Paraplegia, unspecified: Secondary | ICD-10-CM

## 2013-01-08 DIAGNOSIS — R030 Elevated blood-pressure reading, without diagnosis of hypertension: Secondary | ICD-10-CM

## 2013-01-08 LAB — COMPREHENSIVE METABOLIC PANEL
Albumin: 3.8 g/dL (ref 3.5–5.2)
BUN: 11 mg/dL (ref 6–23)
CO2: 27 mEq/L (ref 19–32)
Calcium: 9.7 mg/dL (ref 8.4–10.5)
Chloride: 100 mEq/L (ref 96–112)
Creatinine, Ser: 0.7 mg/dL (ref 0.4–1.5)
GFR: 158.1 mL/min (ref >60.00)
Glucose, Bld: 76 mg/dL (ref 70–99)
Potassium: 3.9 mEq/L (ref 3.5–5.1)

## 2013-01-08 NOTE — Patient Instructions (Addendum)
Good to see you. John Figueroa is on order!!  Keep on the same regimen w/o change.

## 2013-01-08 NOTE — Assessment & Plan Note (Signed)
No change in condition: he does require a lightweight w/c to maintain independence.
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
## 2013-01-08 NOTE — Assessment & Plan Note (Signed)
BP Readings from Last 3 Encounters:  01/08/13 148/90  10/05/12 134/86  08/19/12 137/66   Adequate control.

## 2013-01-13 ENCOUNTER — Encounter: Payer: Self-pay | Admitting: Internal Medicine

## 2013-02-07 ENCOUNTER — Telehealth: Payer: Self-pay | Admitting: General Practice

## 2013-02-07 NOTE — Telephone Encounter (Signed)
Cancelled re-fills of coumadin.  Patient's INR needs to be checked before any more refills.

## 2013-02-13 ENCOUNTER — Telehealth: Payer: Self-pay | Admitting: *Deleted

## 2013-02-13 NOTE — Telephone Encounter (Signed)
Pt called requesting status of Coumadin Rx being cancelled.  Pt transferred to [facility] in [facility] for INR check.

## 2013-02-14 ENCOUNTER — Ambulatory Visit (INDEPENDENT_AMBULATORY_CARE_PROVIDER_SITE_OTHER): Payer: BC Managed Care – PPO | Admitting: Family Medicine

## 2013-02-14 ENCOUNTER — Other Ambulatory Visit: Payer: Self-pay | Admitting: Family Medicine

## 2013-02-14 DIAGNOSIS — I80299 Phlebitis and thrombophlebitis of other deep vessels of unspecified lower extremity: Secondary | ICD-10-CM

## 2013-02-14 DIAGNOSIS — Z7901 Long term (current) use of anticoagulants: Secondary | ICD-10-CM

## 2013-02-14 DIAGNOSIS — I2699 Other pulmonary embolism without acute cor pulmonale: Secondary | ICD-10-CM

## 2013-02-14 LAB — POCT INR: INR: 3.1

## 2013-02-14 MED ORDER — WARFARIN SODIUM 5 MG PO TABS: ORAL_TABLET | ORAL | Status: DC

## 2013-05-30 ENCOUNTER — Other Ambulatory Visit: Payer: Self-pay | Admitting: General Practice

## 2013-05-30 ENCOUNTER — Ambulatory Visit (INDEPENDENT_AMBULATORY_CARE_PROVIDER_SITE_OTHER): Payer: BC Managed Care – PPO | Admitting: General Practice

## 2013-05-30 ENCOUNTER — Encounter: Payer: Self-pay | Admitting: Internal Medicine

## 2013-05-30 ENCOUNTER — Ambulatory Visit (INDEPENDENT_AMBULATORY_CARE_PROVIDER_SITE_OTHER): Payer: BC Managed Care – PPO | Admitting: Internal Medicine

## 2013-05-30 VITALS — BP 126/80 | HR 100 | Temp 98.4°F

## 2013-05-30 DIAGNOSIS — F528 Other sexual dysfunction not due to a substance or known physiological condition: Secondary | ICD-10-CM

## 2013-05-30 DIAGNOSIS — R030 Elevated blood-pressure reading, without diagnosis of hypertension: Secondary | ICD-10-CM

## 2013-05-30 DIAGNOSIS — I80299 Phlebitis and thrombophlebitis of other deep vessels of unspecified lower extremity: Secondary | ICD-10-CM

## 2013-05-30 DIAGNOSIS — N390 Urinary tract infection, site not specified: Secondary | ICD-10-CM

## 2013-05-30 DIAGNOSIS — I2699 Other pulmonary embolism without acute cor pulmonale: Secondary | ICD-10-CM

## 2013-05-30 DIAGNOSIS — Z7901 Long term (current) use of anticoagulants: Secondary | ICD-10-CM

## 2013-05-30 LAB — POCT INR: INR: 1.3

## 2013-05-30 MED ORDER — CEFUROXIME AXETIL 250 MG PO TABS: 250.0000 mg | ORAL_TABLET | Freq: Two times a day (BID) | ORAL | Status: DC

## 2013-05-30 MED ORDER — ALPRAZOLAM 1 MG PO TABS: ORAL_TABLET | ORAL | Status: DC

## 2013-05-30 MED ORDER — CIPROFLOXACIN HCL 250 MG PO TABS: 250.0000 mg | ORAL_TABLET | Freq: Two times a day (BID) | ORAL | Status: DC | PRN

## 2013-05-30 MED ORDER — CEFUROXIME AXETIL 250 MG PO TABS: 250.0000 mg | ORAL_TABLET | Freq: Two times a day (BID) | ORAL | Status: AC

## 2013-05-30 MED ORDER — WARFARIN SODIUM 5 MG PO TABS: ORAL_TABLET | ORAL | Status: DC

## 2013-05-30 NOTE — Progress Notes (Signed)
Pre visit review using our clinic review tool, if applicable. No additional management support is needed unless otherwise documented below in the visit note. 

## 2013-05-30 NOTE — Progress Notes (Signed)
Pre-visit discussion using our clinic review tool. No additional management support is needed unless otherwise documented below in the visit note.  
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
## 2013-05-30 NOTE — Patient Instructions (Signed)
1. Urinary track infections - it is important to not build resistance to antibiotic. So.....be sure there are signs and symptoms of an infection - cloudy urine, fever, chills, increase bladder filling - before taking the antibiotics.  2. At your next full exam in July '15 you should have a cholesterol panel as part of routine care.

## 2013-06-02 NOTE — Assessment & Plan Note (Signed)
Provided with viagra samples and discount card. Also cialis prescription

## 2013-06-02 NOTE — Assessment & Plan Note (Signed)
BP Readings from Last 3 Encounters:  05/30/13 126/80  01/08/13 148/90  10/05/12 134/86

## 2013-06-02 NOTE — Assessment & Plan Note (Signed)
Urinary track infections - it is important to not build resistance to antibiotic. So.....be sure there are signs and symptoms of an infection - cloudy urine, fever, chills, increase bladder filling - before taking the antibiotics.

## 2013-06-08 ENCOUNTER — Ambulatory Visit (INDEPENDENT_AMBULATORY_CARE_PROVIDER_SITE_OTHER): Payer: BC Managed Care – PPO | Admitting: General Practice

## 2013-06-08 DIAGNOSIS — Z7901 Long term (current) use of anticoagulants: Secondary | ICD-10-CM

## 2013-06-08 NOTE — Progress Notes (Signed)
Pre-visit discussion using our clinic review tool. No additional management support is needed unless otherwise documented below in the visit note.  

## 2013-08-08 ENCOUNTER — Ambulatory Visit: Payer: BC Managed Care – PPO | Admitting: Internal Medicine

## 2013-08-27 ENCOUNTER — Ambulatory Visit: Payer: BC Managed Care – PPO | Admitting: Internal Medicine

## 2013-09-11 ENCOUNTER — Ambulatory Visit: Payer: BC Managed Care – PPO | Admitting: Internal Medicine

## 2013-09-11 ENCOUNTER — Ambulatory Visit: Payer: BC Managed Care – PPO

## 2013-09-11 DIAGNOSIS — Z0289 Encounter for other administrative examinations: Secondary | ICD-10-CM

## 2013-09-12 ENCOUNTER — Other Ambulatory Visit (INDEPENDENT_AMBULATORY_CARE_PROVIDER_SITE_OTHER): Payer: BC Managed Care – PPO

## 2013-09-12 ENCOUNTER — Encounter: Payer: Self-pay | Admitting: Internal Medicine

## 2013-09-12 ENCOUNTER — Ambulatory Visit (INDEPENDENT_AMBULATORY_CARE_PROVIDER_SITE_OTHER): Payer: BC Managed Care – PPO | Admitting: Internal Medicine

## 2013-09-12 ENCOUNTER — Ambulatory Visit (INDEPENDENT_AMBULATORY_CARE_PROVIDER_SITE_OTHER): Payer: BC Managed Care – PPO | Admitting: General Practice

## 2013-09-12 VITALS — BP 142/100 | HR 94 | Temp 98.0°F

## 2013-09-12 DIAGNOSIS — Z0000 Encounter for general adult medical examination without abnormal findings: Secondary | ICD-10-CM

## 2013-09-12 DIAGNOSIS — I80299 Phlebitis and thrombophlebitis of other deep vessels of unspecified lower extremity: Secondary | ICD-10-CM

## 2013-09-12 DIAGNOSIS — R030 Elevated blood-pressure reading, without diagnosis of hypertension: Secondary | ICD-10-CM

## 2013-09-12 DIAGNOSIS — Z5181 Encounter for therapeutic drug level monitoring: Secondary | ICD-10-CM

## 2013-09-12 DIAGNOSIS — Z0001 Encounter for general adult medical examination with abnormal findings: Secondary | ICD-10-CM | POA: Insufficient documentation

## 2013-09-12 DIAGNOSIS — M25469 Effusion, unspecified knee: Secondary | ICD-10-CM

## 2013-09-12 DIAGNOSIS — Z7901 Long term (current) use of anticoagulants: Secondary | ICD-10-CM

## 2013-09-12 DIAGNOSIS — M25461 Effusion, right knee: Secondary | ICD-10-CM

## 2013-09-12 DIAGNOSIS — I2699 Other pulmonary embolism without acute cor pulmonale: Secondary | ICD-10-CM

## 2013-09-12 LAB — BASIC METABOLIC PANEL
BUN: 11 mg/dL (ref 6–23)
CO2: 31 mEq/L (ref 19–32)
Calcium: 9.5 mg/dL (ref 8.4–10.5)
Chloride: 98 mEq/L (ref 96–112)
Creatinine, Ser: 0.8 mg/dL (ref 0.4–1.5)
GFR: 148.07 mL/min (ref >60.00)
Glucose, Bld: 91 mg/dL (ref 70–99)
POTASSIUM: 3.4 meq/L — AB (ref 3.5–5.1)
SODIUM: 135 meq/L (ref 135–145)

## 2013-09-12 LAB — CBC WITH DIFFERENTIAL/PLATELET
BASOS ABS: 0 10*3/uL (ref 0.0–0.1)
Basophils Relative: 0.3 % (ref 0.0–3.0)
EOS ABS: 0.3 10*3/uL (ref 0.0–0.7)
Eosinophils Relative: 4.4 % (ref 0.0–5.0)
HEMATOCRIT: 49.4 % (ref 39.0–52.0)
HEMOGLOBIN: 16.7 g/dL (ref 13.0–17.0)
Lymphocytes Relative: 10.6 % — ABNORMAL LOW (ref 12.0–46.0)
Lymphs Abs: 0.8 10*3/uL (ref 0.7–4.0)
MCHC: 33.8 g/dL (ref 30.0–36.0)
MCV: 89.7 fl (ref 78.0–100.0)
Monocytes Absolute: 0.8 10*3/uL (ref 0.1–1.0)
Monocytes Relative: 10.3 % (ref 3.0–12.0)
NEUTROS ABS: 5.7 10*3/uL (ref 1.4–7.7)
Neutrophils Relative %: 74.4 % (ref 43.0–77.0)
Platelets: 320 10*3/uL (ref 150.0–400.0)
RBC: 5.5 Mil/uL (ref 4.22–5.81)
RDW: 15.5 % — AB (ref 11.5–14.6)
WBC: 7.7 10*3/uL (ref 4.5–10.5)

## 2013-09-12 LAB — URINALYSIS, ROUTINE W REFLEX MICROSCOPIC
Hgb urine dipstick: NEGATIVE
Nitrite: POSITIVE — AB
SPECIFIC GRAVITY, URINE: 1.025 (ref 1.000–1.030)
Total Protein, Urine: 100 — AB
URINE GLUCOSE: NEGATIVE
pH: 6.5 (ref 5.0–8.0)

## 2013-09-12 LAB — LIPID PANEL
CHOL/HDL RATIO: 3
Cholesterol: 173 mg/dL (ref 0–200)
HDL: 59 mg/dL (ref >39.00)
LDL Cholesterol: 98 mg/dL (ref 0–99)
Triglycerides: 80 mg/dL (ref 0.0–149.0)
VLDL: 16 mg/dL (ref 0.0–40.0)

## 2013-09-12 LAB — POCT INR: INR: 1.7

## 2013-09-12 LAB — HEPATIC FUNCTION PANEL
ALK PHOS: 90 U/L (ref 39–117)
ALT: 25 U/L (ref 0–53)
AST: 39 U/L — ABNORMAL HIGH (ref 0–37)
Albumin: 3.9 g/dL (ref 3.5–5.2)
BILIRUBIN DIRECT: 0.4 mg/dL — AB (ref 0.0–0.3)
Total Bilirubin: 3.6 mg/dL — ABNORMAL HIGH (ref 0.3–1.2)
Total Protein: 9.4 g/dL — ABNORMAL HIGH (ref 6.0–8.3)

## 2013-09-12 LAB — TSH: TSH: 0.92 u[IU]/mL (ref 0.35–5.50)

## 2013-09-12 NOTE — Progress Notes (Signed)
Pre visit review using our clinic review tool, if applicable. No additional management support is needed unless otherwise documented below in the visit note. 

## 2013-09-12 NOTE — Patient Instructions (Addendum)
Please see John MechCindy Figueroa for your coumadin check today  Please continue all other medications as before, and refills have been done if requested. Please have the pharmacy call with any other refills you may need.  Please continue your efforts at being more active, low cholesterol diet, and weight control. You are otherwise up to date with prevention measures today.  Please keep your appointments with your specialists as you may have planned  Please go to the LAB in the Basement (turn left off the elevator) for the tests to be done today You will be contacted by phone if any changes need to be made immediately.  Otherwise, you will receive a letter about your results with an explanation, but please check with MyChart first.  Please continue to monitor your Blood Pressure at home; your goal should be to be overall less than 140/90  Please use ACE wrap for the right knee; if the swelling is not improved in 2 days, please call to see about an Appointment with Dr Smith/sport medicine in our office to consider having the right knee drained  Please return in 6 months, or sooner if needed

## 2013-09-12 NOTE — Assessment & Plan Note (Signed)
D/w pt, declines med for BP at this time, to cont to monitor BP at home
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
## 2013-09-12 NOTE — Assessment & Plan Note (Signed)
Likely hemarthrosis vs trauamatic arthritis, no pain due to paraplegia, d/w Dr Smith/sport med. For ACE wrap, and f/u with Dr Katrinka BlazingSmith if not improved in 2 days

## 2013-09-12 NOTE — Assessment & Plan Note (Signed)

## 2013-09-14 ENCOUNTER — Ambulatory Visit (INDEPENDENT_AMBULATORY_CARE_PROVIDER_SITE_OTHER): Payer: BC Managed Care – PPO | Admitting: Family Medicine

## 2013-09-14 ENCOUNTER — Other Ambulatory Visit (INDEPENDENT_AMBULATORY_CARE_PROVIDER_SITE_OTHER): Payer: BC Managed Care – PPO

## 2013-09-14 ENCOUNTER — Encounter: Payer: Self-pay | Admitting: Family Medicine

## 2013-09-14 ENCOUNTER — Other Ambulatory Visit: Payer: Self-pay | Admitting: Internal Medicine

## 2013-09-14 VITALS — BP 144/92 | HR 111

## 2013-09-14 DIAGNOSIS — M25461 Effusion, right knee: Secondary | ICD-10-CM

## 2013-09-14 DIAGNOSIS — M25561 Pain in right knee: Secondary | ICD-10-CM

## 2013-09-14 DIAGNOSIS — M25569 Pain in unspecified knee: Secondary | ICD-10-CM

## 2013-09-14 DIAGNOSIS — M25469 Effusion, unspecified knee: Secondary | ICD-10-CM

## 2013-09-14 NOTE — Assessment & Plan Note (Signed)
Patient did have a hemarthrosis and we did get out 20 cc of frank blood. This makes me concerned the patient may also have an anterior cruciate ligament injury but secondary to patient's comorbidities he would not have surgical repair. There is also possibility for a closed fracture of the patient declined x-rays today. We discussed pressure dressing and considering a compression sleeve on this knee for short amount of time. Warned of potential side effects of compression and a paraplegic individual. Discussed icing as well as elevation that could be beneficial. Patient has reaccumulation we can do another drainage if he has any a comfortable feeling worse concern for clotting. And may be a good idea to aspirate the fluid for avoiding the possibility of heterotropic calcific ossification. If patient does come back again I would like to get x-rays.

## 2013-09-14 NOTE — Patient Instructions (Signed)
Good to meet you Wear a compression sleeve can get that at [facility] or [facility] Come back again if it seems to swell but likely you will be good.
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
## 2013-09-19 ENCOUNTER — Ambulatory Visit (INDEPENDENT_AMBULATORY_CARE_PROVIDER_SITE_OTHER): Payer: BC Managed Care – PPO | Admitting: Internal Medicine

## 2013-09-19 ENCOUNTER — Encounter: Payer: Self-pay | Admitting: Internal Medicine

## 2013-09-19 VITALS — BP 138/92 | HR 106 | Temp 98.4°F

## 2013-09-19 DIAGNOSIS — M25469 Effusion, unspecified knee: Secondary | ICD-10-CM

## 2013-09-19 DIAGNOSIS — M25461 Effusion, right knee: Secondary | ICD-10-CM

## 2013-09-19 MED ORDER — ALPRAZOLAM 1 MG PO TABS: ORAL_TABLET | ORAL | Status: DC

## 2013-09-19 NOTE — Progress Notes (Signed)
Pre visit review using our clinic review tool, if applicable. No additional management support is needed unless otherwise documented below in the visit note. 

## 2013-09-21 ENCOUNTER — Ambulatory Visit: Payer: BC Managed Care – PPO | Admitting: Family Medicine

## 2013-09-23 NOTE — Assessment & Plan Note (Signed)
Recurrent effusion right knee. Per Dr. Michaelle CopasSmith's note he is a candidate for repeat aspiration. The picture is complicated by anti-coagulation.  Plan Refer back to Dr. Katrinka BlazingSmith for re-evaluation and probable aspiration.

## 2013-09-23 NOTE — Progress Notes (Signed)
   Subjective:    Patient ID: Yvonna Alanisnthony M Hoel, male    DOB: [birth year], 33 y.o.   MRN: [id number]  HPI Mr. Christell ConstantMoore sustained fall related trauma to his knee. He was seen by Dr. Michiel SitesZ.Smith and underwent u/s guided aspiration of effusion about the knee with return of blood bloody fluid c/w traumatic effusion and possible ACL injury. He returns today for recurrent effusion. He denies any major pain, no fevers or chills.  PMH, FamHx and SocHx reviewed for any changes and relevance.  Current Outpatient Prescriptions on File Prior to Visit  Medication Sig Dispense Refill  . warfarin (COUMADIN) 5 MG tablet take as directed BY ANTICOAGULATION CLINIC  40 tablet  1   No current facility-administered medications on file prior to visit.      Review of Systems System review is negative for any constitutional, cardiac, pulmonary, GI or neuro symptoms or complaints other than as described in the HPI.     Objective:   Physical Exam Filed Vitals:   09/19/13 1530  BP: 138/92  Pulse: 106  Temp: 98.4 F (36.9 C)   Gen'l- tall man , w/c bound, good spirits and in no distress HEENT- C&S clear Cor- 2+ radial pulse, regular PUl - normal respirations MSK - right knee with swelling, fluctuant, not tender (paraplegic with limited sensation), not hot to touch       Assessment & Plan:

## 2013-09-24 ENCOUNTER — Ambulatory Visit: Payer: BC Managed Care – PPO | Admitting: Family Medicine

## 2014-01-17 ENCOUNTER — Ambulatory Visit (INDEPENDENT_AMBULATORY_CARE_PROVIDER_SITE_OTHER): Payer: BC Managed Care – PPO | Admitting: Family Medicine

## 2014-01-17 ENCOUNTER — Encounter: Payer: Self-pay | Admitting: Family Medicine

## 2014-01-17 VITALS — BP 128/92 | HR 89 | Temp 98.4°F | Ht 75.0 in

## 2014-01-17 DIAGNOSIS — H109 Unspecified conjunctivitis: Secondary | ICD-10-CM

## 2014-01-17 MED ORDER — SULFACETAMIDE SODIUM 10 % OP SOLN: 1.0000 [drp] | OPHTHALMIC | Status: DC
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
## 2014-01-17 NOTE — Progress Notes (Signed)
Pre visit review using our clinic review tool, if applicable. No additional management support is needed unless otherwise documented below in the visit note. 

## 2014-01-17 NOTE — Patient Instructions (Signed)
-  schedule new patient visit with Dr. Modena JanskyKohler and call for follow up of your chronic coumadin with coumadin clinic  Conjunctivitis Conjunctivitis is commonly called "pink eye." Conjunctivitis can be caused by bacterial or viral infection, allergies, or injuries. There is usually redness of the lining of the eye, itching, discomfort, and sometimes discharge. There may be deposits of matter along the eyelids. A viral infection usually causes a watery discharge, while a bacterial infection causes a yellowish, thick discharge. Pink eye is very contagious and spreads by direct contact. You may be given antibiotic eyedrops as part of your treatment. Before using your eye medicine, remove all drainage from the eye by washing gently with warm water and cotton balls. Continue to use the medication until you have awakened 2 mornings in a row without discharge from the eye. Do not rub your eye. This increases the irritation and helps spread infection. Use separate towels from other household members. Wash your hands with soap and water before and after touching your eyes. Use cold compresses to reduce pain and sunglasses to relieve irritation from light. Do not wear contact lenses or wear eye makeup until the infection is gone. SEEK MEDICAL CARE IF:   Your symptoms are not better after 3 days of treatment.  You have increased pain or trouble seeing.  The outer eyelids become very red or swollen. Document Released: 07/22/2004 Document Revised: 09/06/2011 Document Reviewed: 06/14/2005 [facility] Patient Information 2015 [place], [place]. This information is not intended to replace advice given to you by your health care provider. Make sure you discuss any questions you have with your health care provider.

## 2014-01-31 ENCOUNTER — Ambulatory Visit: Payer: BC Managed Care – PPO | Admitting: Internal Medicine

## 2014-02-05 ENCOUNTER — Ambulatory Visit (INDEPENDENT_AMBULATORY_CARE_PROVIDER_SITE_OTHER): Payer: BC Managed Care – PPO | Admitting: *Deleted

## 2014-02-05 ENCOUNTER — Encounter: Payer: Self-pay | Admitting: Internal Medicine

## 2014-02-05 ENCOUNTER — Other Ambulatory Visit: Payer: Self-pay | Admitting: *Deleted

## 2014-02-05 ENCOUNTER — Ambulatory Visit (INDEPENDENT_AMBULATORY_CARE_PROVIDER_SITE_OTHER): Payer: BC Managed Care – PPO | Admitting: Internal Medicine

## 2014-02-05 VITALS — BP 130/86 | HR 80 | Temp 98.6°F

## 2014-02-05 DIAGNOSIS — I80299 Phlebitis and thrombophlebitis of other deep vessels of unspecified lower extremity: Secondary | ICD-10-CM

## 2014-02-05 DIAGNOSIS — I2699 Other pulmonary embolism without acute cor pulmonale: Secondary | ICD-10-CM

## 2014-02-05 DIAGNOSIS — Z5181 Encounter for therapeutic drug level monitoring: Secondary | ICD-10-CM

## 2014-02-05 DIAGNOSIS — F411 Generalized anxiety disorder: Secondary | ICD-10-CM

## 2014-02-05 DIAGNOSIS — Z7901 Long term (current) use of anticoagulants: Secondary | ICD-10-CM

## 2014-02-05 DIAGNOSIS — N390 Urinary tract infection, site not specified: Secondary | ICD-10-CM

## 2014-02-05 DIAGNOSIS — R030 Elevated blood-pressure reading, without diagnosis of hypertension: Secondary | ICD-10-CM

## 2014-02-05 LAB — POCT INR: INR: 1.2

## 2014-02-05 MED ORDER — WARFARIN SODIUM 5 MG PO TABS: ORAL_TABLET | ORAL | Status: DC

## 2014-02-05 MED ORDER — CIPROFLOXACIN HCL 250 MG PO TABS: 250.0000 mg | ORAL_TABLET | Freq: Two times a day (BID) | ORAL | Status: DC

## 2014-02-05 MED ORDER — CEFUROXIME AXETIL 250 MG PO TABS: 250.0000 mg | ORAL_TABLET | Freq: Two times a day (BID) | ORAL | Status: DC

## 2014-02-05 MED ORDER — ALPRAZOLAM 1 MG PO TABS: ORAL_TABLET | ORAL | Status: DC

## 2014-02-05 MED ORDER — SILDENAFIL CITRATE 100 MG PO TABS: 50.0000 mg | ORAL_TABLET | Freq: Every day | ORAL | Status: DC | PRN

## 2014-02-05 NOTE — Progress Notes (Signed)
Pre visit review using our clinic review tool, if applicable. No additional management support is needed unless otherwise documented below in the visit note. 
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
## 2014-02-05 NOTE — Patient Instructions (Signed)
Please continue all other medications as before, and refills have been done if requested.  Please have the pharmacy call with any other refills you may need.  Please continue your efforts at being more active, low cholesterol diet, and weight control.  Please keep your appointments with your specialists as you may have planned  Please return in 6 months, or sooner if needed 

## 2014-02-11 NOTE — Assessment & Plan Note (Signed)
stable overall by history and exam, recent data reviewed with pt, and pt to continue medical treatment as before,  to f/u any worsening symptoms or concerns BP Readings from Last 3 Encounters:  02/05/14 130/86  01/17/14 128/92  09/19/13 138/92

## 2014-02-11 NOTE — Assessment & Plan Note (Signed)
stable overall by history and exam, and pt to continue medical treatment as before,  to f/u any worsening symptoms or concerns, for refill med

## 2014-02-11 NOTE — Assessment & Plan Note (Signed)
stable overall by history and exam, and pt to continue medical treatment as before,  to f/u any worsening symptoms or concerns for med refill

## 2014-02-12 ENCOUNTER — Telehealth: Payer: Self-pay | Admitting: *Deleted

## 2014-02-12 NOTE — Telephone Encounter (Signed)
Notified pharmacist spoke with [name] gave md response...[name]/lmb

## 2014-02-12 NOTE — Telephone Encounter (Signed)
Received called from Nana/pharmacist she stated pt can not take the cipro with the warfarin it will raise his INR levels. Requesting md to rx something else to replace cipro...[name]/lmb

## 2014-02-12 NOTE — Telephone Encounter (Signed)
Should be ok for now, as he has been on coumadin and repeated courses cipro in the past and done well

## 2014-03-06 ENCOUNTER — Ambulatory Visit (INDEPENDENT_AMBULATORY_CARE_PROVIDER_SITE_OTHER): Payer: BC Managed Care – PPO | Admitting: *Deleted

## 2014-03-06 DIAGNOSIS — Z5181 Encounter for therapeutic drug level monitoring: Secondary | ICD-10-CM

## 2014-03-06 DIAGNOSIS — I2699 Other pulmonary embolism without acute cor pulmonale: Secondary | ICD-10-CM

## 2014-03-06 DIAGNOSIS — I80299 Phlebitis and thrombophlebitis of other deep vessels of unspecified lower extremity: Secondary | ICD-10-CM

## 2014-03-06 LAB — POCT INR: INR: 1.7

## 2014-05-22 ENCOUNTER — Ambulatory Visit (INDEPENDENT_AMBULATORY_CARE_PROVIDER_SITE_OTHER): Payer: BC Managed Care – PPO

## 2014-05-22 DIAGNOSIS — I82409 Acute embolism and thrombosis of unspecified deep veins of unspecified lower extremity: Secondary | ICD-10-CM

## 2014-05-22 DIAGNOSIS — Z5181 Encounter for therapeutic drug level monitoring: Secondary | ICD-10-CM

## 2014-05-22 DIAGNOSIS — I2699 Other pulmonary embolism without acute cor pulmonale: Secondary | ICD-10-CM

## 2014-05-22 LAB — POCT INR: INR: 1.4

## 2014-05-22 MED ORDER — WARFARIN SODIUM 5 MG PO TABS: ORAL_TABLET | ORAL | Status: DC

## 2014-05-22 NOTE — Progress Notes (Signed)
Recheck in 2 weeks given risk.

## 2014-06-04 ENCOUNTER — Telehealth: Payer: Self-pay

## 2014-06-04 NOTE — Telephone Encounter (Signed)
Pt missed coumadin clinic today (06/04/14).  Appointment was scheduled for 2 pm.  Left message on voicemail to call back and reschedule.

## 2014-08-21 ENCOUNTER — Ambulatory Visit: Payer: BLUE CROSS/BLUE SHIELD | Admitting: Internal Medicine

## 2014-08-27 ENCOUNTER — Ambulatory Visit: Payer: BLUE CROSS/BLUE SHIELD

## 2014-08-28 ENCOUNTER — Encounter: Payer: Self-pay | Admitting: Internal Medicine

## 2014-08-28 ENCOUNTER — Ambulatory Visit (INDEPENDENT_AMBULATORY_CARE_PROVIDER_SITE_OTHER): Payer: BLUE CROSS/BLUE SHIELD | Admitting: General Practice

## 2014-08-28 ENCOUNTER — Other Ambulatory Visit: Payer: Self-pay | Admitting: General Practice

## 2014-08-28 ENCOUNTER — Ambulatory Visit (INDEPENDENT_AMBULATORY_CARE_PROVIDER_SITE_OTHER): Payer: BLUE CROSS/BLUE SHIELD | Admitting: Internal Medicine

## 2014-08-28 VITALS — BP 136/88 | HR 85 | Temp 98.6°F | Ht 75.0 in

## 2014-08-28 DIAGNOSIS — Z0000 Encounter for general adult medical examination without abnormal findings: Secondary | ICD-10-CM

## 2014-08-28 DIAGNOSIS — Z5181 Encounter for therapeutic drug level monitoring: Secondary | ICD-10-CM

## 2014-08-28 LAB — POCT INR: INR: 1.3

## 2014-08-28 MED ORDER — ALPRAZOLAM 1 MG PO TABS: ORAL_TABLET | ORAL | Status: DC

## 2014-08-28 MED ORDER — SILDENAFIL CITRATE 100 MG PO TABS: 50.0000 mg | ORAL_TABLET | Freq: Every day | ORAL | Status: DC | PRN

## 2014-08-28 MED ORDER — CIPROFLOXACIN HCL 250 MG PO TABS: 250.0000 mg | ORAL_TABLET | Freq: Two times a day (BID) | ORAL | Status: DC

## 2014-08-28 MED ORDER — CEFUROXIME AXETIL 250 MG PO TABS: 250.0000 mg | ORAL_TABLET | Freq: Two times a day (BID) | ORAL | Status: DC

## 2014-08-28 MED ORDER — WARFARIN SODIUM 5 MG PO TABS: ORAL_TABLET | ORAL | Status: DC

## 2014-08-28 NOTE — Assessment & Plan Note (Signed)

## 2014-08-28 NOTE — Progress Notes (Signed)
Pre visit review using our clinic review tool, if applicable. No additional management support is needed unless otherwise documented below in the visit note. 

## 2014-08-28 NOTE — Patient Instructions (Signed)
Please continue all other medications as before, and refills have been done if requested.  Please have the pharmacy call with any other refills you may need.  Please continue your efforts at being more active, low cholesterol diet, and weight control.  You are otherwise up to date with prevention measures today.  Please keep your appointments with your specialists as you may have planned  Please go to the LAB in the Basement (turn left off the elevator) for the tests to be done today  You will be contacted by phone if any changes need to be made immediately.  Otherwise, you will receive a letter about your results with an explanation, but please check with MyChart first.  Please return in 1 year for your yearly visit, or sooner if needed
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
## 2014-08-28 NOTE — Progress Notes (Signed)
Agree with plan 

## 2014-09-04 ENCOUNTER — Ambulatory Visit: Payer: BLUE CROSS/BLUE SHIELD

## 2014-10-08 ENCOUNTER — Ambulatory Visit: Payer: BLUE CROSS/BLUE SHIELD

## 2014-10-08 ENCOUNTER — Ambulatory Visit (INDEPENDENT_AMBULATORY_CARE_PROVIDER_SITE_OTHER): Payer: BLUE CROSS/BLUE SHIELD

## 2014-10-08 ENCOUNTER — Other Ambulatory Visit: Payer: Self-pay

## 2014-10-08 DIAGNOSIS — Z5181 Encounter for therapeutic drug level monitoring: Secondary | ICD-10-CM

## 2014-10-08 LAB — POCT INR: INR: 1.6

## 2014-10-08 MED ORDER — WARFARIN SODIUM 5 MG PO TABS: ORAL_TABLET | ORAL | Status: DC

## 2014-10-08 NOTE — Progress Notes (Signed)
Agree with plan 

## 2014-10-08 NOTE — Progress Notes (Signed)
Pre-visit discussion using our clinic review tool. No additional management support is needed unless otherwise documented below in the visit note.  

## 2014-10-22 ENCOUNTER — Ambulatory Visit (INDEPENDENT_AMBULATORY_CARE_PROVIDER_SITE_OTHER): Payer: BLUE CROSS/BLUE SHIELD | Admitting: General Practice

## 2014-10-22 DIAGNOSIS — Z5181 Encounter for therapeutic drug level monitoring: Secondary | ICD-10-CM | POA: Diagnosis not present

## 2014-10-22 LAB — POCT INR: INR: 1.8

## 2014-10-22 NOTE — Progress Notes (Signed)
Pre visit review using our clinic review tool, if applicable. No additional management support is needed unless otherwise documented below in the visit note. 

## 2014-10-22 NOTE — Progress Notes (Signed)
Agree with plan 

## 2014-11-19 ENCOUNTER — Ambulatory Visit: Payer: BLUE CROSS/BLUE SHIELD

## 2014-12-03 ENCOUNTER — Encounter: Payer: Self-pay | Admitting: Internal Medicine

## 2014-12-03 ENCOUNTER — Telehealth: Payer: Self-pay | Admitting: Internal Medicine

## 2014-12-03 NOTE — Telephone Encounter (Signed)
Patient's mother needs a letter stating patient is disabled and is under your care for her insurance. There is a letter that was signed by Dr. Debby BudNorins 07/18/2008 that she said we can use but it was signed by Dr. Debby BudNorins. Can we get the same one signed by Dr. Jonny RuizJohn and faxed to [phone] attn: Eula Listenynthia Clanton.

## 2014-12-03 NOTE — Telephone Encounter (Signed)
[facility] done, [facility]

## 2014-12-04 NOTE — Telephone Encounter (Signed)
Letter printed and faxed

## 2014-12-10 ENCOUNTER — Ambulatory Visit: Payer: BLUE CROSS/BLUE SHIELD

## 2014-12-17 ENCOUNTER — Other Ambulatory Visit: Payer: Self-pay | Admitting: General Practice

## 2014-12-17 ENCOUNTER — Ambulatory Visit (INDEPENDENT_AMBULATORY_CARE_PROVIDER_SITE_OTHER): Payer: BLUE CROSS/BLUE SHIELD | Admitting: General Practice

## 2014-12-17 DIAGNOSIS — Z5181 Encounter for therapeutic drug level monitoring: Secondary | ICD-10-CM

## 2014-12-17 LAB — POCT INR: INR: 3

## 2014-12-17 MED ORDER — WARFARIN SODIUM 5 MG PO TABS: ORAL_TABLET | ORAL | Status: DC

## 2014-12-17 NOTE — Progress Notes (Signed)
Pre visit review using our clinic review tool, if applicable. No additional management support is needed unless otherwise documented below in the visit note. 

## 2014-12-17 NOTE — Progress Notes (Signed)
I have reviewed and agree with the plan. 

## 2015-01-14 ENCOUNTER — Ambulatory Visit (INDEPENDENT_AMBULATORY_CARE_PROVIDER_SITE_OTHER): Payer: BLUE CROSS/BLUE SHIELD | Admitting: General Practice

## 2015-01-14 ENCOUNTER — Telehealth: Payer: Self-pay | Admitting: General Practice

## 2015-01-14 DIAGNOSIS — Z5181 Encounter for therapeutic drug level monitoring: Secondary | ICD-10-CM

## 2015-01-14 LAB — POCT INR: INR: 1.8

## 2015-01-14 NOTE — Telephone Encounter (Signed)
Faxed order to have pt hold coumadin on Sunday 7/24 for dental procedure on 7/25.

## 2015-01-14 NOTE — Progress Notes (Signed)
Pre visit review using our clinic review tool, if applicable. No additional management support is needed unless otherwise documented below in the visit note. 

## 2015-01-14 NOTE — Patient Instructions (Signed)
Hold coumadin on Sunday 7/24. On Tuesday, 7/26 restart coumadin and take an extra 1/2 tablet for 2 days.

## 2015-01-14 NOTE — Progress Notes (Signed)
I have reviewed and agree with the plan. 

## 2015-02-11 ENCOUNTER — Ambulatory Visit: Payer: BLUE CROSS/BLUE SHIELD

## 2015-03-20 ENCOUNTER — Other Ambulatory Visit: Payer: Self-pay | Admitting: Internal Medicine

## 2015-03-20 NOTE — Telephone Encounter (Signed)
Done hardcopy to Dahlia  

## 2015-03-20 NOTE — Telephone Encounter (Signed)
Rx faxed to pharmacy  

## 2015-04-22 ENCOUNTER — Other Ambulatory Visit: Payer: Self-pay | Admitting: General Practice

## 2015-04-22 ENCOUNTER — Ambulatory Visit (INDEPENDENT_AMBULATORY_CARE_PROVIDER_SITE_OTHER): Payer: BLUE CROSS/BLUE SHIELD | Admitting: General Practice

## 2015-04-22 DIAGNOSIS — Z5181 Encounter for therapeutic drug level monitoring: Secondary | ICD-10-CM | POA: Diagnosis not present

## 2015-04-22 LAB — POCT INR: INR: 1.4

## 2015-04-22 MED ORDER — WARFARIN SODIUM 5 MG PO TABS: ORAL_TABLET | ORAL | Status: DC

## 2015-04-22 NOTE — Progress Notes (Signed)
I have reviewed and agree with the plan. 

## 2015-04-22 NOTE — Progress Notes (Signed)
Pre visit review using our clinic review tool, if applicable. No additional management support is needed unless otherwise documented below in the visit note. 

## 2015-05-20 ENCOUNTER — Ambulatory Visit: Payer: BLUE CROSS/BLUE SHIELD

## 2015-07-14 ENCOUNTER — Ambulatory Visit: Payer: BLUE CROSS/BLUE SHIELD | Admitting: Internal Medicine

## 2015-07-15 ENCOUNTER — Encounter: Payer: Self-pay | Admitting: Internal Medicine

## 2015-07-15 ENCOUNTER — Other Ambulatory Visit: Payer: Self-pay | Admitting: General Practice

## 2015-07-15 ENCOUNTER — Ambulatory Visit (INDEPENDENT_AMBULATORY_CARE_PROVIDER_SITE_OTHER): Payer: BLUE CROSS/BLUE SHIELD | Admitting: General Practice

## 2015-07-15 ENCOUNTER — Ambulatory Visit (INDEPENDENT_AMBULATORY_CARE_PROVIDER_SITE_OTHER): Payer: BLUE CROSS/BLUE SHIELD | Admitting: Internal Medicine

## 2015-07-15 ENCOUNTER — Other Ambulatory Visit: Payer: BLUE CROSS/BLUE SHIELD

## 2015-07-15 VITALS — BP 132/86 | HR 105 | Temp 98.6°F | Resp 20 | Ht 75.0 in

## 2015-07-15 DIAGNOSIS — F411 Generalized anxiety disorder: Secondary | ICD-10-CM | POA: Diagnosis not present

## 2015-07-15 DIAGNOSIS — Z0000 Encounter for general adult medical examination without abnormal findings: Secondary | ICD-10-CM | POA: Diagnosis not present

## 2015-07-15 DIAGNOSIS — E785 Hyperlipidemia, unspecified: Secondary | ICD-10-CM

## 2015-07-15 DIAGNOSIS — Z5181 Encounter for therapeutic drug level monitoring: Secondary | ICD-10-CM

## 2015-07-15 LAB — POCT INR: INR: 1.7

## 2015-07-15 MED ORDER — CIPROFLOXACIN HCL 250 MG PO TABS: 250.0000 mg | ORAL_TABLET | Freq: Two times a day (BID) | ORAL | Status: DC

## 2015-07-15 MED ORDER — ALPRAZOLAM 1 MG PO TABS: 1.0000 mg | ORAL_TABLET | Freq: Four times a day (QID) | ORAL | Status: DC

## 2015-07-15 MED ORDER — CEFUROXIME AXETIL 250 MG PO TABS: 250.0000 mg | ORAL_TABLET | Freq: Two times a day (BID) | ORAL | Status: DC

## 2015-07-15 MED ORDER — WARFARIN SODIUM 5 MG PO TABS: ORAL_TABLET | ORAL | Status: DC
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
## 2015-07-15 NOTE — Assessment & Plan Note (Signed)

## 2015-07-15 NOTE — Progress Notes (Signed)
I have reviewed and agree with the plan. 

## 2015-07-15 NOTE — Addendum Note (Signed)
Addended by: Corwin Levins on: 07/15/2015 03:31 PM   Modules accepted: Orders

## 2015-07-15 NOTE — Assessment & Plan Note (Signed)
stable overall by history and exam, recent data reviewed with pt, and pt to continue medical treatment as before,  to f/u any worsening symptoms or concerns Lab Results  Component Value Date   WBC 7.7 09/12/2013   HGB 16.7 09/12/2013   HCT 49.4 09/12/2013   PLT 320.0 09/12/2013   GLUCOSE 91 09/12/2013   CHOL 173 09/12/2013   TRIG 80.0 09/12/2013   HDL 59.00 09/12/2013   LDLCALC 98 09/12/2013   ALT 25 09/12/2013   AST 39* 09/12/2013   NA 135 09/12/2013   K 3.4* 09/12/2013   CL 98 09/12/2013   CREATININE 0.8 09/12/2013   BUN 11 09/12/2013   CO2 31 09/12/2013   TSH 0.92 09/12/2013   INR 1.7 07/15/2015

## 2015-07-15 NOTE — Progress Notes (Signed)
Pre visit review using our clinic review tool, if applicable. No additional management support is needed unless otherwise documented below in the visit note.  INR low today.  Boosted patient one day and then instructed to continue the same dosage.   Patient said he got off track and missed a couple of doses.  Encouraged patient to take medication every day and discussed the possible side effects of INR being to low.  Patient verbalized understanding.

## 2015-07-15 NOTE — Patient Instructions (Addendum)

## 2015-07-15 NOTE — Progress Notes (Signed)
Pre visit review using our clinic review tool, if applicable. No additional management support is needed unless otherwise documented below in the visit note. 

## 2015-08-15 ENCOUNTER — Ambulatory Visit: Payer: BLUE CROSS/BLUE SHIELD

## 2015-09-02 ENCOUNTER — Telehealth: Payer: Self-pay | Admitting: Internal Medicine

## 2015-09-02 ENCOUNTER — Telehealth: Payer: Self-pay | Admitting: *Deleted

## 2015-09-02 MED ORDER — CIPROFLOXACIN HCL 250 MG PO TABS: 250.0000 mg | ORAL_TABLET | Freq: Two times a day (BID) | ORAL | Status: DC

## 2015-09-02 MED ORDER — CEFUROXIME AXETIL 250 MG PO TABS: 250.0000 mg | ORAL_TABLET | Freq: Two times a day (BID) | ORAL | Status: DC

## 2015-09-02 MED ORDER — ALPRAZOLAM 1 MG PO TABS: 1.0000 mg | ORAL_TABLET | Freq: Four times a day (QID) | ORAL | Status: DC

## 2015-09-02 NOTE — Telephone Encounter (Signed)
Notified pt Per corrine rx's [facility] to [facility]...[name]/lmb

## 2015-09-02 NOTE — Telephone Encounter (Signed)
Received call pt states he has miss place the prescriptions md gave him for antibiotics & alprazolam. [facility]....[name]/lmb

## 2015-09-02 NOTE — Telephone Encounter (Signed)
Actually all 3 rx printed to John Figueroa

## 2015-09-02 NOTE — Telephone Encounter (Signed)
antibx x 2 sent ers  Alprazolam Done hardcopy to [facility]

## 2015-09-02 NOTE — Telephone Encounter (Signed)
[facility]...[name]/lmb

## 2015-10-09 ENCOUNTER — Emergency Department (HOSPITAL_COMMUNITY)
Admission: EM | Admit: 2015-10-09 | Discharge: 2015-10-10 | Disposition: A | Discharge status: Home / Self Care | Payer: BLUE CROSS/BLUE SHIELD | Attending: Emergency Medicine | Admitting: Emergency Medicine

## 2015-10-09 ENCOUNTER — Encounter (HOSPITAL_COMMUNITY): Payer: Self-pay

## 2015-10-09 ENCOUNTER — Emergency Department (HOSPITAL_COMMUNITY): Payer: BLUE CROSS/BLUE SHIELD

## 2015-10-09 DIAGNOSIS — R05 Cough: Secondary | ICD-10-CM | POA: Diagnosis present

## 2015-10-09 DIAGNOSIS — F419 Anxiety disorder, unspecified: Secondary | ICD-10-CM | POA: Diagnosis not present

## 2015-10-09 DIAGNOSIS — Z79899 Other long term (current) drug therapy: Secondary | ICD-10-CM | POA: Insufficient documentation

## 2015-10-09 DIAGNOSIS — Z87438 Personal history of other diseases of male genital organs: Secondary | ICD-10-CM | POA: Insufficient documentation

## 2015-10-09 DIAGNOSIS — Z7901 Long term (current) use of anticoagulants: Secondary | ICD-10-CM | POA: Diagnosis not present

## 2015-10-09 DIAGNOSIS — Z86711 Personal history of pulmonary embolism: Secondary | ICD-10-CM | POA: Insufficient documentation

## 2015-10-09 DIAGNOSIS — R112 Nausea with vomiting, unspecified: Secondary | ICD-10-CM

## 2015-10-09 DIAGNOSIS — J069 Acute upper respiratory infection, unspecified: Secondary | ICD-10-CM

## 2015-10-09 DIAGNOSIS — Z87440 Personal history of urinary (tract) infections: Secondary | ICD-10-CM | POA: Diagnosis not present

## 2015-10-09 DIAGNOSIS — Z872 Personal history of diseases of the skin and subcutaneous tissue: Secondary | ICD-10-CM | POA: Insufficient documentation

## 2015-10-09 DIAGNOSIS — Z86718 Personal history of other venous thrombosis and embolism: Secondary | ICD-10-CM | POA: Diagnosis not present

## 2015-10-09 DIAGNOSIS — Z87828 Personal history of other (healed) physical injury and trauma: Secondary | ICD-10-CM | POA: Diagnosis not present

## 2015-10-09 DIAGNOSIS — J029 Acute pharyngitis, unspecified: Secondary | ICD-10-CM | POA: Diagnosis not present

## 2015-10-09 LAB — COMPREHENSIVE METABOLIC PANEL
ALT: 28 U/L (ref 17–63)
AST: 52 U/L — AB (ref 15–41)
Albumin: 4.5 g/dL (ref 3.5–5.0)
Alkaline Phosphatase: 64 U/L (ref 38–126)
Anion gap: 24 — ABNORMAL HIGH (ref 5–15)
BILIRUBIN TOTAL: 3 mg/dL — AB (ref 0.3–1.2)
BUN: 10 mg/dL (ref 6–20)
CO2: 24 mmol/L (ref 22–32)
Calcium: 10.2 mg/dL (ref 8.9–10.3)
Chloride: 97 mmol/L — ABNORMAL LOW (ref 101–111)
Creatinine, Ser: 1.25 mg/dL — ABNORMAL HIGH (ref 0.61–1.24)
GFR calc Af Amer: >60 mL/min (ref >60)
Glucose, Bld: 215 mg/dL — ABNORMAL HIGH (ref 65–99)
POTASSIUM: 3.3 mmol/L — AB (ref 3.5–5.1)
Sodium: 145 mmol/L (ref 135–145)
TOTAL PROTEIN: 9.1 g/dL — AB (ref 6.5–8.1)

## 2015-10-09 LAB — CBC
HEMATOCRIT: 45.2 % (ref 39.0–52.0)
Hemoglobin: 15.3 g/dL (ref 13.0–17.0)
MCH: 30.5 pg (ref 26.0–34.0)
MCHC: 33.8 g/dL (ref 30.0–36.0)
MCV: 90 fL (ref 78.0–100.0)
Platelets: 349 10*3/uL (ref 150–400)
RBC: 5.02 MIL/uL (ref 4.22–5.81)
RDW: 14 % (ref 11.5–15.5)
WBC: 7.8 10*3/uL (ref 4.0–10.5)

## 2015-10-09 LAB — PROTIME-INR
INR: 1.79 — ABNORMAL HIGH (ref 0.00–1.49)
PROTHROMBIN TIME: 20.8 s — AB (ref 11.6–15.2)

## 2015-10-09 MED ORDER — SODIUM CHLORIDE 0.9 % IV BOLUS (SEPSIS)
1000.0000 mL | Freq: Once | INTRAVENOUS | Status: AC
  Administered 2015-10-09: 1000 mL via INTRAVENOUS

## 2015-10-09 MED ORDER — ONDANSETRON HCL 4 MG/2ML IJ SOLN
4.0000 mg | Freq: Once | INTRAMUSCULAR | Status: AC
  Administered 2015-10-09: 4 mg via INTRAVENOUS
  Filled 2015-10-09: qty 2

## 2015-10-09 MED ORDER — IOPAMIDOL (ISOVUE-370) INJECTION 76%
INTRAVENOUS | Status: AC
  Administered 2015-10-09: 100 mL
  Filled 2015-10-09: qty 100
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
## 2015-10-09 NOTE — ED Notes (Signed)
Pt reports he has been coughing up blood since yesterday. He takes Warfarin due to hx of blood clots in legs and lungs. He reports throat pain. Pt coughing into a red solo cup but no blood noted inside of cup. A&OX4.

## 2015-10-09 NOTE — ED Notes (Signed)
Patient transported to CT 

## 2015-10-10 MED ORDER — ONDANSETRON HCL 4 MG PO TABS: 4.0000 mg | ORAL_TABLET | Freq: Four times a day (QID) | ORAL | Status: DC

## 2015-10-10 MED ORDER — HYDROCOD POLST-CPM POLST ER 10-8 MG/5ML PO SUER: 5.0000 mL | Freq: Every evening | ORAL | Status: DC | PRN

## 2015-10-10 NOTE — Discharge Instructions (Signed)
Cough, Adult A cough helps to clear your throat and lungs. A cough may last only 2-3 weeks (acute), or it may last longer than 8 weeks (chronic). Many different things can cause a cough. A cough may be a sign of an illness or another medical condition. HOME CARE  Pay attention to any changes in your cough.  Take medicines only as told by your doctor.  If you were prescribed an antibiotic medicine, take it as told by your doctor. Do not stop taking it even if you start to feel better.  Talk with your doctor before you try using a cough medicine.  Drink enough fluid to keep your pee (urine) clear or pale yellow.  If the air is dry, use a cold steam vaporizer or humidifier in your home.  Stay away from things that make you cough at work or at home.  If your cough is worse at night, try using extra pillows to raise your head up higher while you sleep.  Do not smoke, and try not to be around smoke. If you need help quitting, ask your doctor.  Do not have caffeine.  Do not drink alcohol.  Rest as needed. GET HELP IF:  You have new problems (symptoms).  You cough up yellow fluid (pus).  Your cough does not get better after 2-3 weeks, or your cough gets worse.  Medicine does not help your cough and you are not sleeping well.  You have pain that gets worse or pain that is not helped with medicine.  You have a fever.  You are losing weight and you do not know why.  You have night sweats. GET HELP RIGHT AWAY IF:  You cough up blood.  You have trouble breathing.  Your heartbeat is very fast.   This information is not intended to replace advice given to you by your health care provider. Make sure you discuss any questions you have with your health care provider.   Document Released: 02/25/2011 Document Revised: 03/05/2015 Document Reviewed: 08/21/2014 Elsevier Interactive Patient Education 2016 Elsevier Inc. Sore Throat A sore throat is pain, burning, irritation, or  scratchiness of the throat. There is often pain or tenderness when swallowing or talking. A sore throat may be accompanied by other symptoms, such as coughing, sneezing, fever, and swollen neck glands. A sore throat is often the first sign of another sickness, such as a cold, flu, strep throat, or mononucleosis (commonly known as mono). Most sore throats go away without medical treatment. CAUSES  The most common causes of a sore throat include:  A viral infection, such as a cold, flu, or mono.  A bacterial infection, such as strep throat, tonsillitis, or whooping cough.  Seasonal allergies.  Dryness in the air.  Irritants, such as smoke or pollution.  Gastroesophageal reflux disease (GERD). HOME CARE INSTRUCTIONS   Only take over-the-counter medicines as directed by your caregiver.  Drink enough fluids to keep your urine clear or pale yellow.  Rest as needed.  Try using throat sprays, lozenges, or sucking on hard candy to ease any pain (if older than 4 years or as directed).  Sip warm liquids, such as broth, herbal tea, or warm water with honey to relieve pain temporarily. You may also eat or drink cold or frozen liquids such as frozen ice pops.  Gargle with salt water (mix 1 tsp salt with 8 oz of water).  Do not smoke and avoid secondhand smoke.  Put a cool-mist humidifier in your bedroom at  night to moisten the air. You can also turn on a hot shower and sit in the bathroom with the door closed for 5-10 minutes. SEEK IMMEDIATE MEDICAL CARE IF:  You have difficulty breathing.  You are unable to swallow fluids, soft foods, or your saliva.  You have increased swelling in the throat.  Your sore throat does not get better in 7 days.  You have nausea and vomiting.  You have a fever or persistent symptoms for more than 2-3 days.  You have a fever and your symptoms suddenly get worse. MAKE SURE YOU:   Understand these instructions.  Will watch your condition.  Will get  help right away if you are not doing well or get worse.   This information is not intended to replace advice given to you by your health care provider. Make sure you discuss any questions you have with your health care provider.   Document Released: 07/22/2004 Document Revised: 07/05/2014 Document Reviewed: 02/20/2012 Elsevier Interactive Patient Education 2016 Elsevier Inc. Nausea and Vomiting Nausea is a sick feeling that often comes before throwing up (vomiting). Vomiting is a reflex where stomach contents come out of your mouth. Vomiting can cause severe loss of body fluids (dehydration). Children and elderly adults can become dehydrated quickly, especially if they also have diarrhea. Nausea and vomiting are symptoms of a condition or disease. It is important to find the cause of your symptoms. CAUSES   Direct irritation of the stomach lining. This irritation can result from increased acid production (gastroesophageal reflux disease), infection, food poisoning, taking certain medicines (such as nonsteroidal anti-inflammatory drugs), alcohol use, or tobacco use.  Signals from the brain.These signals could be caused by a headache, heat exposure, an inner ear disturbance, increased pressure in the brain from injury, infection, a tumor, or a concussion, pain, emotional stimulus, or metabolic problems.  An obstruction in the gastrointestinal tract (bowel obstruction).  Illnesses such as diabetes, hepatitis, gallbladder problems, appendicitis, kidney problems, cancer, sepsis, atypical symptoms of a heart attack, or eating disorders.  Medical treatments such as chemotherapy and radiation.  Receiving medicine that makes you sleep (general anesthetic) during surgery. DIAGNOSIS Your caregiver may ask for tests to be done if the problems do not improve after a few days. Tests may also be done if symptoms are severe or if the reason for the nausea and vomiting is not clear. Tests may  include:  Urine tests.  Blood tests.  Stool tests.  Cultures (to look for evidence of infection).  X-rays or other imaging studies. Test results can help your caregiver make decisions about treatment or the need for additional tests. TREATMENT You need to stay well hydrated. Drink frequently but in small amounts.You may wish to drink water, sports drinks, clear broth, or eat frozen ice pops or gelatin dessert to help stay hydrated.When you eat, eating slowly may help prevent nausea.There are also some antinausea medicines that may help prevent nausea. HOME CARE INSTRUCTIONS   Take all medicine as directed by your caregiver.  If you do not have an appetite, do not force yourself to eat. However, you must continue to drink fluids.  If you have an appetite, eat a normal diet unless your caregiver tells you differently.  Eat a variety of complex carbohydrates (rice, wheat, potatoes, bread), lean meats, yogurt, fruits, and vegetables.  Avoid high-fat foods because they are more difficult to digest.  Drink enough water and fluids to keep your urine clear or pale yellow.  If you are dehydrated,  ask your caregiver for specific rehydration instructions. Signs of dehydration may include:  Severe thirst.  Dry lips and mouth.  Dizziness.  Dark urine.  Decreasing urine frequency and amount.  Confusion.  Rapid breathing or pulse. SEEK IMMEDIATE MEDICAL CARE IF:   You have blood or brown flecks (like coffee grounds) in your vomit.  You have black or bloody stools.  You have a severe headache or stiff neck.  You are confused.  You have severe abdominal pain.  You have chest pain or trouble breathing.  You do not urinate at least once every 8 hours.  You develop cold or clammy skin.  You continue to vomit for longer than 24 to 48 hours.  You have a fever. MAKE SURE YOU:   Understand these instructions.  Will watch your condition.  Will get help right away if  you are not doing well or get worse.   This information is not intended to replace advice given to you by your health care provider. Make sure you discuss any questions you have with your health care provider.   Document Released: 06/14/2005 Document Revised: 09/06/2011 Document Reviewed: 11/11/2010 Elsevier Interactive Patient Education [employer] Elsevier Inc.

## 2015-10-10 NOTE — ED Notes (Signed)
Np gave ok for pt to eat/drink; pt provided water and Ginger Ale by RN

## 2015-12-26 ENCOUNTER — Ambulatory Visit: Payer: BLUE CROSS/BLUE SHIELD

## 2016-01-02 ENCOUNTER — Ambulatory Visit: Payer: BLUE CROSS/BLUE SHIELD

## 2016-01-07 ENCOUNTER — Ambulatory Visit (INDEPENDENT_AMBULATORY_CARE_PROVIDER_SITE_OTHER): Payer: BLUE CROSS/BLUE SHIELD | Admitting: General Practice

## 2016-01-07 ENCOUNTER — Other Ambulatory Visit: Payer: Self-pay | Admitting: General Practice

## 2016-01-07 DIAGNOSIS — Z5181 Encounter for therapeutic drug level monitoring: Secondary | ICD-10-CM

## 2016-01-07 LAB — POCT INR: INR: 3.1

## 2016-01-07 MED ORDER — WARFARIN SODIUM 5 MG PO TABS: ORAL_TABLET | ORAL | Status: DC

## 2016-01-07 NOTE — Progress Notes (Signed)
I have reviewed and agree with the plan. 

## 2016-01-07 NOTE — Progress Notes (Signed)
Pre visit review using our clinic review tool, if applicable. No additional management support is needed unless otherwise documented below in the visit note. 

## 2016-01-27 ENCOUNTER — Other Ambulatory Visit: Payer: Self-pay | Admitting: Internal Medicine

## 2016-01-27 NOTE — Telephone Encounter (Signed)
Done hardcopy to Corinne  

## 2016-01-27 NOTE — Telephone Encounter (Signed)
Refill sent to pharmacy.   

## 2016-02-04 ENCOUNTER — Ambulatory Visit: Payer: BLUE CROSS/BLUE SHIELD

## 2016-03-03 ENCOUNTER — Ambulatory Visit (INDEPENDENT_AMBULATORY_CARE_PROVIDER_SITE_OTHER): Payer: BLUE CROSS/BLUE SHIELD | Admitting: Internal Medicine

## 2016-03-03 VITALS — BP 130/72 | HR 90 | Temp 98.2°F | Resp 20

## 2016-03-03 DIAGNOSIS — Z3169 Encounter for other general counseling and advice on procreation: Secondary | ICD-10-CM | POA: Diagnosis not present

## 2016-03-03 DIAGNOSIS — F528 Other sexual dysfunction not due to a substance or known physiological condition: Secondary | ICD-10-CM | POA: Diagnosis not present

## 2016-03-03 DIAGNOSIS — F411 Generalized anxiety disorder: Secondary | ICD-10-CM

## 2016-03-03 DIAGNOSIS — R5383 Other fatigue: Secondary | ICD-10-CM

## 2016-03-03 NOTE — Progress Notes (Signed)
Pre visit review using our clinic review tool, if applicable. No additional management support is needed unless otherwise documented below in the visit note. 

## 2016-03-03 NOTE — Patient Instructions (Signed)
Please continue all other medications as before, and refills have been done if requested.  Please have the pharmacy call with any other refills you may need.  Please continue your efforts at being more active, low cholesterol diet, and weight control.  You are otherwise up to date with prevention measures today.  Please keep your appointments with your specialists as you may have planned  You will be contacted regarding the referral for: [facility]in[facility]  Please go to the LAB in the Basement (turn left off the elevator) for the tests to be done today  You will be contacted by phone if any changes need to be made immediately.  Otherwise, you will receive a letter about your results with an explanation, but please check with MyChart first.  Please return in 6 months, or sooner if needed
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
## 2016-03-07 NOTE — Assessment & Plan Note (Signed)
stable overall by history and exam, recent data reviewed with pt, and pt to continue medical treatment as before,  to f/u any worsening symptoms or concerns Lab Results  Component Value Date   WBC 7.8 10/09/2015   HGB 15.3 10/09/2015   HCT 45.2 10/09/2015   PLT 349 10/09/2015   GLUCOSE 215 (H) 10/09/2015   CHOL 173 09/12/2013   TRIG 80.0 09/12/2013   HDL 59.00 09/12/2013   LDLCALC 98 09/12/2013   ALT 28 10/09/2015   AST 52 (H) 10/09/2015   NA 145 10/09/2015   K 3.3 (L) 10/09/2015   CL 97 (L) 10/09/2015   CREATININE 1.25 (H) 10/09/2015   BUN 10 10/09/2015   CO2 24 10/09/2015   TSH 0.92 09/12/2013   INR 3.1 01/07/2016

## 2016-03-07 NOTE — Assessment & Plan Note (Signed)
Pt wanting to pursue improved function to gain pregnancy with girlfriend, for urology referral

## 2016-03-07 NOTE — Assessment & Plan Note (Signed)
Etiology unclear, Exam otherwise benign, to check labs as documented, follow with expectant management  

## 2016-03-16 ENCOUNTER — Telehealth: Payer: Self-pay | Admitting: Internal Medicine

## 2016-03-16 DIAGNOSIS — N529 Male erectile dysfunction, unspecified: Secondary | ICD-10-CM

## 2016-03-16 NOTE — Telephone Encounter (Signed)
Referral done

## 2016-03-16 NOTE — Telephone Encounter (Signed)
[facility] called [facility] regarding pt's referral for endocrinology preconception counseling. They said with men they should be referred to Urology.  I called [facility] and they can see him there for the referral.  Can you please change referral for Urology.

## 2016-06-09 ENCOUNTER — Encounter: Payer: Self-pay | Admitting: Internal Medicine

## 2016-06-09 ENCOUNTER — Ambulatory Visit (INDEPENDENT_AMBULATORY_CARE_PROVIDER_SITE_OTHER): Payer: BLUE CROSS/BLUE SHIELD | Admitting: Internal Medicine

## 2016-06-09 VITALS — BP 130/72 | HR 100 | Temp 98.4°F | Resp 20

## 2016-06-09 DIAGNOSIS — J069 Acute upper respiratory infection, unspecified: Secondary | ICD-10-CM | POA: Diagnosis not present

## 2016-06-09 DIAGNOSIS — J309 Allergic rhinitis, unspecified: Secondary | ICD-10-CM

## 2016-06-09 DIAGNOSIS — F411 Generalized anxiety disorder: Secondary | ICD-10-CM

## 2016-06-09 MED ORDER — CEFUROXIME AXETIL 250 MG PO TABS: 250.0000 mg | ORAL_TABLET | Freq: Two times a day (BID) | ORAL | 2 refills | Status: DC

## 2016-06-09 MED ORDER — ALPRAZOLAM 1 MG PO TABS: 1.0000 mg | ORAL_TABLET | Freq: Four times a day (QID) | ORAL | 5 refills | Status: DC

## 2016-06-09 MED ORDER — CIPROFLOXACIN HCL 250 MG PO TABS: 250.0000 mg | ORAL_TABLET | Freq: Two times a day (BID) | ORAL | 2 refills | Status: DC

## 2016-06-09 MED ORDER — AZITHROMYCIN 200 MG/5ML PO SUSR
ORAL | 0 refills | Status: DC
Start: 2016-06-09 — End: 2016-07-09

## 2016-06-09 NOTE — Patient Instructions (Signed)
Please take all new medication as prescribed - the new antibiotic  Please continue all other medications as before, and refills have been done if requested - the other 2 antibiotics and the xanax (remember the xanax is not due for new prescription until Aug 17, 2016 because you should have more refills on the last prescription)  Please have the pharmacy call with any other refills you may need.  Please continue your efforts at being more active, low cholesterol diet, and weight control.  Please keep your appointments with your specialists as you may have planned

## 2016-06-09 NOTE — Progress Notes (Signed)
Pre visit review using our clinic review tool, if applicable. No additional management support is needed unless otherwise documented below in the visit note. 
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
## 2016-06-11 ENCOUNTER — Other Ambulatory Visit: Payer: Self-pay | Admitting: General Practice

## 2016-06-11 ENCOUNTER — Ambulatory Visit: Payer: BLUE CROSS/BLUE SHIELD

## 2016-06-11 ENCOUNTER — Ambulatory Visit (INDEPENDENT_AMBULATORY_CARE_PROVIDER_SITE_OTHER): Payer: BLUE CROSS/BLUE SHIELD | Admitting: General Practice

## 2016-06-11 DIAGNOSIS — Z5181 Encounter for therapeutic drug level monitoring: Secondary | ICD-10-CM

## 2016-06-11 LAB — POCT INR: INR: 1.2

## 2016-06-11 MED ORDER — WARFARIN SODIUM 5 MG PO TABS: ORAL_TABLET | ORAL | 2 refills | Status: DC

## 2016-06-11 NOTE — Patient Instructions (Signed)
Pre visit review using our clinic review tool, if applicable. No additional management support is needed unless otherwise documented below in the visit note. 

## 2016-06-13 NOTE — Progress Notes (Signed)
I have reviewed and agree with the plan. 

## 2016-06-13 NOTE — Assessment & Plan Note (Signed)
Mild to mod, for antibx course,  to f/u any worsening symptoms or concerns 

## 2016-06-13 NOTE — Assessment & Plan Note (Signed)
stable overall by history and exam, recent data reviewed with pt, and pt to continue medical treatment as before - for xanax refill today,  to f/u any worsening symptoms or concerns Lab Results  Component Value Date   WBC 7.8 10/09/2015   HGB 15.3 10/09/2015   HCT 45.2 10/09/2015   PLT 349 10/09/2015   GLUCOSE 215 (H) 10/09/2015   CHOL 173 09/12/2013   TRIG 80.0 09/12/2013   HDL 59.00 09/12/2013   LDLCALC 98 09/12/2013   ALT 28 10/09/2015   AST 52 (H) 10/09/2015   NA 145 10/09/2015   K 3.3 (L) 10/09/2015   CL 97 (L) 10/09/2015   CREATININE 1.25 (H) 10/09/2015   BUN 10 10/09/2015   CO2 24 10/09/2015   TSH 0.92 09/12/2013   INR 1.2 06/11/2016

## 2016-06-13 NOTE — Assessment & Plan Note (Signed)
Mild to mod, for otc zyrtec or nasacort asd,  to f/u any worsening symptoms or concerns

## 2016-07-02 ENCOUNTER — Ambulatory Visit: Payer: BLUE CROSS/BLUE SHIELD

## 2016-07-09 ENCOUNTER — Ambulatory Visit (INDEPENDENT_AMBULATORY_CARE_PROVIDER_SITE_OTHER): Payer: Self-pay | Admitting: Internal Medicine

## 2016-07-09 ENCOUNTER — Ambulatory Visit (INDEPENDENT_AMBULATORY_CARE_PROVIDER_SITE_OTHER): Payer: Self-pay | Admitting: General Practice

## 2016-07-09 ENCOUNTER — Encounter: Payer: Self-pay | Admitting: Internal Medicine

## 2016-07-09 VITALS — BP 136/76 | HR 80 | Resp 20 | Wt 200.0 lb

## 2016-07-09 DIAGNOSIS — G8220 Paraplegia, unspecified: Secondary | ICD-10-CM

## 2016-07-09 DIAGNOSIS — F411 Generalized anxiety disorder: Secondary | ICD-10-CM

## 2016-07-09 DIAGNOSIS — Z0001 Encounter for general adult medical examination with abnormal findings: Secondary | ICD-10-CM

## 2016-07-09 DIAGNOSIS — Z5181 Encounter for therapeutic drug level monitoring: Secondary | ICD-10-CM

## 2016-07-09 DIAGNOSIS — R030 Elevated blood-pressure reading, without diagnosis of hypertension: Secondary | ICD-10-CM

## 2016-07-09 LAB — POCT INR: INR: 1.1

## 2016-07-09 NOTE — Assessment & Plan Note (Signed)
BP Readings from Last 3 Encounters:  07/09/16 136/76  06/09/16 130/72  03/03/16 130/72   stable overall by history and exam, recent data reviewed with pt, and pt to continue medical treatment as before,  to f/u any worsening symptoms or concerns

## 2016-07-09 NOTE — Patient Instructions (Signed)
Pre visit review using our clinic review tool, if applicable. No additional management support is needed unless otherwise documented below in the visit note. 

## 2016-07-09 NOTE — Patient Instructions (Addendum)
Please continue all other medications as before, and refills have been done if requested.  Please have the pharmacy call with any other refills you may need.  Please keep your appointments with your specialists as you may have planned  Your form is filled out today  Please return in 6 months, or sooner if needed, with Lab testing done 3-5 days before

## 2016-07-09 NOTE — Progress Notes (Signed)
Pre visit review using our clinic review tool, if applicable. No additional management support is needed unless otherwise documented below in the visit note. 
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
## 2016-07-09 NOTE — Assessment & Plan Note (Signed)
Stable, to cont the xanax prn,  to f/u any worsening symptoms or concerns, declines counseling referral

## 2016-07-09 NOTE — Progress Notes (Signed)
I have reviewed and agree with the plan. 

## 2016-07-09 NOTE — Assessment & Plan Note (Signed)
Stable, form filled out today for insurance purpose

## 2016-07-23 ENCOUNTER — Ambulatory Visit: Payer: BLUE CROSS/BLUE SHIELD

## 2016-10-13 ENCOUNTER — Ambulatory Visit (INDEPENDENT_AMBULATORY_CARE_PROVIDER_SITE_OTHER): Payer: Managed Care, Other (non HMO) | Admitting: Internal Medicine

## 2016-10-13 ENCOUNTER — Ambulatory Visit (INDEPENDENT_AMBULATORY_CARE_PROVIDER_SITE_OTHER): Payer: Managed Care, Other (non HMO) | Admitting: General Practice

## 2016-10-13 ENCOUNTER — Other Ambulatory Visit (INDEPENDENT_AMBULATORY_CARE_PROVIDER_SITE_OTHER): Payer: Managed Care, Other (non HMO)

## 2016-10-13 ENCOUNTER — Encounter: Payer: Self-pay | Admitting: Internal Medicine

## 2016-10-13 VITALS — BP 126/82 | HR 82 | Ht 78.0 in | Wt 190.0 lb

## 2016-10-13 DIAGNOSIS — L03115 Cellulitis of right lower limb: Secondary | ICD-10-CM | POA: Diagnosis not present

## 2016-10-13 DIAGNOSIS — L97519 Non-pressure chronic ulcer of other part of right foot with unspecified severity: Secondary | ICD-10-CM

## 2016-10-13 DIAGNOSIS — Z5181 Encounter for therapeutic drug level monitoring: Secondary | ICD-10-CM | POA: Diagnosis not present

## 2016-10-13 LAB — BASIC METABOLIC PANEL
BUN: 8 mg/dL (ref 6–23)
CO2: 28 meq/L (ref 19–32)
CREATININE: 0.71 mg/dL (ref 0.40–1.50)
Calcium: 9.4 mg/dL (ref 8.4–10.5)
Chloride: 98 mEq/L (ref 96–112)
GFR: 159.82 mL/min (ref >60.00)
Glucose, Bld: 78 mg/dL (ref 70–99)
POTASSIUM: 3.4 meq/L — AB (ref 3.5–5.1)
Sodium: 136 mEq/L (ref 135–145)

## 2016-10-13 LAB — CBC WITH DIFFERENTIAL/PLATELET
BASOS ABS: 0.1 10*3/uL (ref 0.0–0.1)
Basophils Relative: 1.1 % (ref 0.0–3.0)
Eosinophils Absolute: 0.2 10*3/uL (ref 0.0–0.7)
Eosinophils Relative: 3.3 % (ref 0.0–5.0)
HEMATOCRIT: 41.4 % (ref 39.0–52.0)
HEMOGLOBIN: 14.1 g/dL (ref 13.0–17.0)
LYMPHS PCT: 13.7 % (ref 12.0–46.0)
Lymphs Abs: 0.9 10*3/uL (ref 0.7–4.0)
MCHC: 34.1 g/dL (ref 30.0–36.0)
MCV: 91.1 fl (ref 78.0–100.0)
MONOS PCT: 12.8 % — AB (ref 3.0–12.0)
Monocytes Absolute: 0.9 10*3/uL (ref 0.1–1.0)
NEUTROS ABS: 4.6 10*3/uL (ref 1.4–7.7)
Neutrophils Relative %: 69.1 % (ref 43.0–77.0)
Platelets: 345 10*3/uL (ref 150.0–400.0)
RBC: 4.55 Mil/uL (ref 4.22–5.81)
RDW: 13.7 % (ref 11.5–15.5)
WBC: 6.7 10*3/uL (ref 4.0–10.5)

## 2016-10-13 LAB — POCT INR: INR: 1.7

## 2016-10-13 MED ORDER — LEVOFLOXACIN 500 MG PO TABS: 500.0000 mg | ORAL_TABLET | Freq: Every day | ORAL | 0 refills | Status: AC

## 2016-10-13 MED ORDER — CEFTRIAXONE SODIUM 1 G IJ SOLR
1.0000 g | Freq: Once | INTRAMUSCULAR | Status: AC
  Administered 2016-10-13: 1 g via INTRAMUSCULAR

## 2016-10-13 NOTE — Progress Notes (Signed)
Pre visit review using our clinic review tool, if applicable. No additional management support is needed unless otherwise documented below in the visit note. 

## 2016-10-13 NOTE — Patient Instructions (Signed)
Pre visit review using our clinic review tool, if applicable. No additional management support is needed unless otherwise documented below in the visit note. 

## 2016-10-13 NOTE — Patient Instructions (Addendum)
You had the antibiotic shot today (rocephin)  Please take all new medication as prescribed - the oral levaquin   Please continue all other medications as before, and refills have been done if requested.  Please have the pharmacy call with any other refills you may need.  Please keep your appointments with your specialists as you may have planned  You will be contacted regarding the referral for: CT scan right foot (to see [facility] now)  Please go to the LAB in the Basement (turn left off the elevator) for the tests to be done today (for labs done stat prior to CT)  You will be contacted by phone if any changes need to be made immediately.  Otherwise, you will receive a letter about your results with an explanation, but please check with MyChart first.  Please remember to sign up for MyChart if you have not done so, as this will be important to you in the future with finding out test results, communicating by private email, and scheduling acute appointments online when needed.
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
## 2016-10-13 NOTE — Progress Notes (Signed)
I have reviewed and agree with the plan. 

## 2016-10-13 NOTE — Addendum Note (Signed)
Addended by: Corwin Levins on: 10/13/2016 11:17 AM   Modules accepted: Orders

## 2016-10-13 NOTE — Assessment & Plan Note (Signed)
Moderate new onset over 2 wks, now with small open ulceration/wound to dorsal right third toe and quite large but insensate redness and swelling to the dorsal foot with possible extension I suspect to the plantar - abscess cannot be ruled out; will give rocephin IM 1 gm , start levaquin course oral, for state BMP for renal fxn then stat CT right foot with CM - r/o abscess, and refer podiatry

## 2016-10-14 ENCOUNTER — Ambulatory Visit (INDEPENDENT_AMBULATORY_CARE_PROVIDER_SITE_OTHER): Payer: Managed Care, Other (non HMO) | Admitting: Podiatry

## 2016-10-14 ENCOUNTER — Encounter: Payer: Self-pay | Admitting: Internal Medicine

## 2016-10-14 ENCOUNTER — Encounter: Payer: Self-pay | Admitting: Podiatry

## 2016-10-14 ENCOUNTER — Ambulatory Visit (INDEPENDENT_AMBULATORY_CARE_PROVIDER_SITE_OTHER): Payer: Managed Care, Other (non HMO)

## 2016-10-14 DIAGNOSIS — L97519 Non-pressure chronic ulcer of other part of right foot with unspecified severity: Secondary | ICD-10-CM

## 2016-10-14 DIAGNOSIS — L03031 Cellulitis of right toe: Secondary | ICD-10-CM | POA: Diagnosis not present

## 2016-10-14 DIAGNOSIS — L02611 Cutaneous abscess of right foot: Secondary | ICD-10-CM

## 2016-10-14 MED ORDER — SILVER SULFADIAZINE 1 % EX CREA: 1.0000 "application " | TOPICAL_CREAM | Freq: Every day | CUTANEOUS | 0 refills | Status: DC

## 2016-10-14 NOTE — Progress Notes (Signed)
Subjective:     Patient ID: John Figueroa, male   DOB: 08/20/1980, 36 y.o.   MRN: [id number]  HPI 36 year old male presents the office today for concerns of a wound of the dorsal aspect of the right third toe which is been ongoing for about 2 weeks. He states this may have started as a blister needed. Skin off 2 days ago and had pus. He saw his primary care physician yesterday he was given a dose of Rocephin he was also started on Levaquin. He states he still has one to his right foot. He denies any systemic complaints as fevers, chills, nausea, vomiting.  Review of Systems  All other systems reviewed and are negative.      Objective:   Physical Exam General: AAO x3, NAD  Dermatological: On the dorsal aspect of the right third toe is a wound at the IPJ measuring approximately 0.8 x 0.8 cm. Is a definite 0.2 cm but it is not probed to bone. There is no undermining or tunneling. There is no ascending cellulitis, fluctuance, crepitus, malodor. There is edema to the right foot there is no warmth or cellulitis to the foot. No other open lesions or pre-ulcerative lesions are identified today.   Vascular: Dorsalis Pedis artery and Posterior Tibial artery pedal pulses are 2/4 bilateral with immedate capillary fill time. Pedal hair growth present.  There is no pain with calf compression, swelling, warmth, erythema.   Neruologic: Patient is paraplegic affected lower extremities.   Musculoskeletal: Hammertoes are present. Paraplegia bilaterally  Gait: Wheelchair     Assessment:     Ulceration right third toe cellulitis, edema    Plan:     -Treatment options discussed including all alternatives, risks, and complications -Etiology of symptoms were discussed -X-rays were obtained and reviewed with the patient. No definitive evidence of acute off 2 minus. Await CT scan. No soft tissue emphysema. -As a CT scan ordered by his primary care physician. -Continue antibiotics. -Wound was lightly  debrided today. Continue with the knee dressing changes daily. This was prescribed today. -Surgical shoe was dispensed the open toed take pressure off the toe. -Monitor for any clinical signs or symptoms of infection and directed to call the office immediately should any occur or go to the ER. -RTC 1 week or sooner if needed.  Ovid Curd, DPM

## 2016-10-21 ENCOUNTER — Encounter: Payer: Self-pay | Admitting: Podiatry

## 2016-10-21 ENCOUNTER — Ambulatory Visit (INDEPENDENT_AMBULATORY_CARE_PROVIDER_SITE_OTHER): Payer: Managed Care, Other (non HMO) | Admitting: Podiatry

## 2016-10-21 DIAGNOSIS — L03031 Cellulitis of right toe: Secondary | ICD-10-CM | POA: Diagnosis not present

## 2016-10-21 DIAGNOSIS — L97519 Non-pressure chronic ulcer of other part of right foot with unspecified severity: Secondary | ICD-10-CM

## 2016-10-21 DIAGNOSIS — L02611 Cutaneous abscess of right foot: Secondary | ICD-10-CM

## 2016-10-21 NOTE — Progress Notes (Signed)
Subjective: 36 year old male presents the occipital evaluation of right third ulceration, cellulitis. He states that he is doing much better. He is scheduled for CT scan tomorrow which is ordered by his primary care physician. He has continue with daily dressing changes and he has been applying the Silvadene to the wound daily. He denies any drainage or pus the swelling is also greatly improved. He hasn't went surgical shoe. Denies any systemic complaints such as fevers, chills, nausea, vomiting. No acute changes since last appointment, and no other complaints at this time.   Objective: AAO x3, NAD DP/PT pulses palpable bilaterally, CRT less than 3 seconds On the dorsal aspect of the right third PIPJ the wound appears to be healing the measures 0.4 x 0.4 cm in a superficial. There is no probing, undermining or tunneling. Scab appears to be forming along the wound. There is no drainage or pus. There is no probing, undermining or tunneling. No malodor.  No other open lesions or pre-ulcerative lesions.  No pain with calf compression, swelling, warmth, erythema  Assessment: Healing ulceration right third toe  Plan: -All treatment options discussed with the patient including all alternatives, risks, complications.  -Recommend continuing daily dressing change of the Silvadene during the day Achilles the area with a dry bandage at night to help dry the area out. He is scheduled for CT scan tomorrow. At the wound is healing I doubt osteomyelitis but will follow-up. -Monitor for any clinical signs or symptoms of infection and directed to call the office immediately should any occur or go to the ER. -RTC 2 weeks or sooner if needed. -Patient encouraged to call the office with any questions, concerns, change in symptoms.   Ovid Curd, DPM

## 2016-10-22 ENCOUNTER — Ambulatory Visit
Admission: RE | Admit: 2016-10-22 | Discharge: 2016-10-22 | Disposition: A | Discharge status: Home / Self Care | Payer: Managed Care, Other (non HMO) | Source: Ambulatory Visit | Attending: Internal Medicine | Admitting: Internal Medicine

## 2016-10-22 ENCOUNTER — Encounter: Payer: Self-pay | Admitting: Internal Medicine

## 2016-10-22 DIAGNOSIS — L03115 Cellulitis of right lower limb: Secondary | ICD-10-CM

## 2016-10-22 DIAGNOSIS — L97519 Non-pressure chronic ulcer of other part of right foot with unspecified severity: Secondary | ICD-10-CM

## 2016-10-22 MED ORDER — IOPAMIDOL (ISOVUE-300) INJECTION 61%
100.0000 mL | Freq: Once | INTRAVENOUS | Status: AC | PRN
  Administered 2016-10-22: 100 mL via INTRAVENOUS

## 2016-10-26 ENCOUNTER — Telehealth: Payer: Self-pay

## 2016-10-26 NOTE — Telephone Encounter (Signed)
-----   Message from Corwin Levins, MD sent at 10/22/2016  4:56 PM EDT ----- Left message on MyChart, pt to cont same tx except  The test results show that your current treatment is OK, except the CT is suggestive of bone infection.  We have referred you to Podiatry urgently, and this will be addressed at that time.    John Figueroa to please inform pt, and encourage him to keep the podiatry referral as this is VERY important

## 2016-10-26 NOTE — Telephone Encounter (Signed)
Informed pt of results and he expressed understanding. He stated that he has been seeing podiatry and wanted me to fax a copy of the results to them.

## 2016-11-04 ENCOUNTER — Ambulatory Visit: Payer: Managed Care, Other (non HMO) | Admitting: Podiatry

## 2016-11-05 ENCOUNTER — Ambulatory Visit (INDEPENDENT_AMBULATORY_CARE_PROVIDER_SITE_OTHER): Payer: Managed Care, Other (non HMO) | Admitting: Podiatry

## 2016-11-05 ENCOUNTER — Encounter: Payer: Self-pay | Admitting: Podiatry

## 2016-11-05 DIAGNOSIS — M869 Osteomyelitis, unspecified: Secondary | ICD-10-CM

## 2016-11-05 DIAGNOSIS — L97519 Non-pressure chronic ulcer of other part of right foot with unspecified severity: Secondary | ICD-10-CM | POA: Diagnosis not present

## 2016-11-08 MED ORDER — CEPHALEXIN 500 MG PO CAPS: 500.0000 mg | ORAL_CAPSULE | Freq: Three times a day (TID) | ORAL | 2 refills | Status: DC

## 2016-11-08 NOTE — Progress Notes (Signed)
Subjective: 36 year old male presents the occipital evaluation of right third ulceration, cellulitis. He states he is doing much better and he feels that the toe is healed. He did present to the copy of his CT scan results which was concern for possible osteomyelitis. He denies any increase in swelling or redness or any drainage or any malodor to the wound. Overall he states he is improved in the wound is healed. Denies any systemic complaints such as fevers, chills, nausea, vomiting. No acute changes since last appointment, and no other complaints at this time.   Objective: AAO x3, NAD DP/PT pulses palpable bilaterally, CRT less than 3 seconds On the dorsal aspect of the right third PIPJ  Along dorsal PIPJ of the right 5 third digiti is a hyperkeratotic lesion. Upon debridement there is no underlying ulceration identified today and the area appears to be most completely healed. There is no increase in edema, erythema, increase in warmth. There is no fluctuance or crepitus there is no malodor. No other open lesions or pre-ulcerative lesions identified today. Hammertoe contractures are present.  No other open lesions or pre-ulcerative lesions.  No pain with calf compression, swelling, warmth, erythema  Assessment: Healing ulceration right third toe  Plan: -All treatment options discussed with the patient including all alternatives, risks, complications.  -Reviewed the CT scan with the patient was concern for possible tumor was. However clinically the wound appears to be almost completely healed and there is no clinical signs of infection today. However given the concern for the CT scan I discussed the treatment options were also myelitis. At this point I would've blood work including CBC, ESR, CRP. We'll also restart antibiotics. -I'll see him back in 1-2 we and repeat x-rays will be obtained. Also follow-up evaluation of the wound at that point.  -Monitor for any clinical signs or symptoms of  infection and directed to call the office immediately should any occur or go to the ER.  Celesta Gentile, DPM

## 2016-11-19 ENCOUNTER — Ambulatory Visit: Payer: Managed Care, Other (non HMO) | Admitting: Podiatry

## 2016-12-03 ENCOUNTER — Ambulatory Visit (INDEPENDENT_AMBULATORY_CARE_PROVIDER_SITE_OTHER): Payer: Managed Care, Other (non HMO)

## 2016-12-03 ENCOUNTER — Encounter: Payer: Self-pay | Admitting: Podiatry

## 2016-12-03 ENCOUNTER — Ambulatory Visit (INDEPENDENT_AMBULATORY_CARE_PROVIDER_SITE_OTHER): Payer: Managed Care, Other (non HMO) | Admitting: Podiatry

## 2016-12-03 DIAGNOSIS — M2041 Other hammer toe(s) (acquired), right foot: Secondary | ICD-10-CM

## 2016-12-03 DIAGNOSIS — M2042 Other hammer toe(s) (acquired), left foot: Secondary | ICD-10-CM

## 2016-12-03 DIAGNOSIS — L84 Corns and callosities: Secondary | ICD-10-CM

## 2016-12-03 DIAGNOSIS — M869 Osteomyelitis, unspecified: Secondary | ICD-10-CM

## 2016-12-07 NOTE — Progress Notes (Signed)
Subjective: 36 year old male presents the occipital evaluation of right third ulceration, cellulitis. He states he is doing well to the third toe has not noticed any drainage or redness or any swelling. He presents a dry bandage along the toe for protection. He has no new concerns today. He is no longer on any antibiotics. Denies any systemic complaints such as fevers, chills, nausea, vomiting. No acute changes since last appointment, and no other complaints at this time.   Objective: AAO x3, NAD DP/PT pulses palpable bilaterally, CRT less than 3 seconds On the dorsal aspect of the right third PIPJ  Along dorsal PIPJ of the right third digit there is a hyperkeratotic lesion. Upon debridement there is no underlying ulceration, drainage or any clinical signs of infection today. There is no edema to the toenail is no erythema or increase in warmth. There is no fluctuance, crepitus, malodor. No ascending cellulitis. No malodor. Hammertoes are present.  No other open lesions or pre-ulcerative lesions.  No pain with calf compression, swelling, warmth, erythema  Assessment: Healing ulceration right third toe  Plan: -All treatment options discussed with the patient including all alternatives, risks, complications.   X-rays were obtained and reviewed. No evidence of acute osteomyelitis identified. -I sharply debrided the hyperkeratotic lesion. Underlying ulceration appears to be healed. There is no clinical signs of infection to the toe. I discussed with the monitor closely for any recurrence. -He did not get blood work that was ordered last appoitnment -Again discussed with him the CT findings concerning for osteomyelitis however clinically he does not appear to have any symptoms and the x-rays are unchanged. We will continue to monitor closely. -Monitor for any clinical signs or symptoms of infection and directed to call the office immediately should any occur or go to the ER. -RTC 6 weeks or sooner if  needed.   Ovid CurdMatthew Wagoner, DPM

## 2017-01-14 ENCOUNTER — Ambulatory Visit: Payer: Managed Care, Other (non HMO) | Admitting: Podiatry

## 2017-02-16 ENCOUNTER — Other Ambulatory Visit: Payer: Self-pay | Admitting: Internal Medicine

## 2017-02-16 NOTE — Telephone Encounter (Signed)
Xanax done hardcopy to [facility]  Coumadin is normally refilled per [facility] - ok to let pt request there

## 2017-02-16 NOTE — Telephone Encounter (Signed)
Correct, I added Cindy to the msg. She advised [place] to send all coum refills to her at out last clinical meeting. Cindy please advise.

## 2017-02-17 ENCOUNTER — Other Ambulatory Visit: Payer: Self-pay | Admitting: General Practice

## 2017-02-17 MED ORDER — WARFARIN SODIUM 5 MG PO TABS: ORAL_TABLET | ORAL | 2 refills | Status: DC

## 2017-02-17 NOTE — Telephone Encounter (Signed)
Done

## 2017-02-18 ENCOUNTER — Ambulatory Visit: Payer: Managed Care, Other (non HMO) | Admitting: Internal Medicine

## 2017-02-18 ENCOUNTER — Ambulatory Visit: Payer: Managed Care, Other (non HMO)

## 2017-02-23 ENCOUNTER — Ambulatory Visit: Payer: Managed Care, Other (non HMO) | Admitting: Internal Medicine

## 2017-02-23 ENCOUNTER — Ambulatory Visit: Payer: Managed Care, Other (non HMO)

## 2017-05-13 ENCOUNTER — Encounter (HOSPITAL_COMMUNITY): Payer: Self-pay | Admitting: Emergency Medicine

## 2017-05-13 ENCOUNTER — Emergency Department (HOSPITAL_COMMUNITY)
Admission: EM | Admit: 2017-05-13 | Discharge: 2017-05-14 | Disposition: A | Discharge status: Home / Self Care | Payer: Managed Care, Other (non HMO) | Attending: Emergency Medicine | Admitting: Emergency Medicine

## 2017-05-13 ENCOUNTER — Emergency Department (HOSPITAL_COMMUNITY): Payer: Managed Care, Other (non HMO)

## 2017-05-13 ENCOUNTER — Other Ambulatory Visit: Payer: Self-pay

## 2017-05-13 DIAGNOSIS — Z79899 Other long term (current) drug therapy: Secondary | ICD-10-CM | POA: Diagnosis not present

## 2017-05-13 DIAGNOSIS — M542 Cervicalgia: Secondary | ICD-10-CM | POA: Diagnosis not present

## 2017-05-13 DIAGNOSIS — G8220 Paraplegia, unspecified: Secondary | ICD-10-CM | POA: Diagnosis not present

## 2017-05-13 DIAGNOSIS — Y92410 Unspecified street and highway as the place of occurrence of the external cause: Secondary | ICD-10-CM | POA: Diagnosis not present

## 2017-05-13 DIAGNOSIS — Y999 Unspecified external cause status: Secondary | ICD-10-CM | POA: Diagnosis not present

## 2017-05-13 DIAGNOSIS — Z7901 Long term (current) use of anticoagulants: Secondary | ICD-10-CM | POA: Diagnosis not present

## 2017-05-13 DIAGNOSIS — S0990XA Unspecified injury of head, initial encounter: Secondary | ICD-10-CM | POA: Diagnosis present

## 2017-05-13 DIAGNOSIS — Y9389 Activity, other specified: Secondary | ICD-10-CM | POA: Diagnosis not present

## 2017-05-13 MED ORDER — HYDROCODONE-ACETAMINOPHEN 5-325 MG PO TABS
2.0000 | ORAL_TABLET | Freq: Once | ORAL | Status: AC
  Administered 2017-05-13: 2 via ORAL
  Filled 2017-05-13: qty 2

## 2017-05-13 NOTE — ED Notes (Signed)
Pt used urinal and then preceded to pour urinal contents onto the floor.
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
## 2017-05-13 NOTE — ED Notes (Signed)
Pt to CT
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
## 2017-05-14 MED ORDER — CYCLOBENZAPRINE HCL 10 MG PO TABS: 10.0000 mg | ORAL_TABLET | Freq: Two times a day (BID) | ORAL | 0 refills | Status: DC | PRN

## 2017-05-14 MED ORDER — ONDANSETRON 4 MG PO TBDP
4.0000 mg | ORAL_TABLET | Freq: Once | ORAL | Status: AC
  Administered 2017-05-14: 4 mg via ORAL
  Filled 2017-05-14: qty 1

## 2017-07-13 ENCOUNTER — Telehealth: Payer: Self-pay | Admitting: Internal Medicine

## 2017-07-13 NOTE — Telephone Encounter (Signed)
Patients mother dropped off DMV form for a handicap placard to be filled out by the physician, she brought extra incase one was lost and for the office to have extra for the patients,  I advised her he may need an appointment because he hasnt been seen since 09-2016 and we would call him back.  She stated that was fine.  Please advise if patient needs an appointment or can this be filled out,  Will be placing in your box,

## 2017-07-14 NOTE — Telephone Encounter (Signed)
Form has been signed, copy sent to scan & Called to inform patient. NO VM set up on both phones. It is ready up front to be picked up.

## 2017-07-14 NOTE — Telephone Encounter (Signed)
Done hardcopy to Shirron  

## 2017-07-14 NOTE — Telephone Encounter (Signed)
Forms have been completed & placed in MD's box to review and sign.  °

## 2017-08-24 ENCOUNTER — Encounter: Payer: Self-pay | Admitting: Internal Medicine

## 2017-08-24 ENCOUNTER — Ambulatory Visit: Payer: Managed Care, Other (non HMO) | Admitting: Internal Medicine

## 2017-08-24 VITALS — BP 128/88 | HR 93 | Ht 78.0 in

## 2017-08-24 DIAGNOSIS — Z0000 Encounter for general adult medical examination without abnormal findings: Secondary | ICD-10-CM | POA: Diagnosis not present

## 2017-08-24 DIAGNOSIS — Z114 Encounter for screening for human immunodeficiency virus [HIV]: Secondary | ICD-10-CM

## 2017-08-24 MED ORDER — SILDENAFIL CITRATE 100 MG PO TABS: 50.0000 mg | ORAL_TABLET | Freq: Every day | ORAL | 11 refills | Status: DC | PRN

## 2017-08-24 MED ORDER — SILVER SULFADIAZINE 1 % EX CREA: 1.0000 "application " | TOPICAL_CREAM | Freq: Every day | CUTANEOUS | 0 refills | Status: DC

## 2017-08-24 MED ORDER — ALPRAZOLAM 1 MG PO TABS: 1.0000 mg | ORAL_TABLET | Freq: Four times a day (QID) | ORAL | 5 refills | Status: DC

## 2017-08-24 MED ORDER — CEFUROXIME AXETIL 250 MG PO TABS: 250.0000 mg | ORAL_TABLET | Freq: Two times a day (BID) | ORAL | 2 refills | Status: AC

## 2017-08-24 MED ORDER — CIPROFLOXACIN HCL 500 MG PO TABS: 500.0000 mg | ORAL_TABLET | Freq: Two times a day (BID) | ORAL | 2 refills | Status: AC
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
## 2017-08-24 NOTE — Assessment & Plan Note (Signed)

## 2017-08-24 NOTE — Patient Instructions (Addendum)

## 2017-09-20 ENCOUNTER — Other Ambulatory Visit: Payer: Self-pay | Admitting: General Practice

## 2017-09-20 ENCOUNTER — Ambulatory Visit: Payer: Managed Care, Other (non HMO)

## 2017-09-20 ENCOUNTER — Ambulatory Visit (INDEPENDENT_AMBULATORY_CARE_PROVIDER_SITE_OTHER): Payer: Managed Care, Other (non HMO) | Admitting: General Practice

## 2017-09-20 DIAGNOSIS — Z5181 Encounter for therapeutic drug level monitoring: Secondary | ICD-10-CM

## 2017-09-20 LAB — POCT INR: INR: 2.1

## 2017-09-20 MED ORDER — WARFARIN SODIUM 5 MG PO TABS: ORAL_TABLET | ORAL | 2 refills | Status: DC

## 2017-09-20 NOTE — Patient Instructions (Addendum)
Pre visit review using our clinic review tool, if applicable. No additional management support is needed unless otherwise documented below in the visit note.  Continue to take  1 1/2 tablets everyday except 1 tablet on Monday and Friday. Re-check in 4 weeks.

## 2017-11-21 ENCOUNTER — Other Ambulatory Visit: Payer: Self-pay

## 2017-11-21 ENCOUNTER — Emergency Department (HOSPITAL_COMMUNITY): Payer: Managed Care, Other (non HMO)

## 2017-11-21 ENCOUNTER — Inpatient Hospital Stay (HOSPITAL_COMMUNITY)
Admission: EM | Admit: 2017-11-21 | Discharge: 2017-11-23 | DRG: 378 | Disposition: A | Discharge status: Home / Self Care | Payer: Managed Care, Other (non HMO) | Attending: Internal Medicine | Admitting: Internal Medicine

## 2017-11-21 ENCOUNTER — Encounter (HOSPITAL_COMMUNITY): Payer: Self-pay | Admitting: Emergency Medicine

## 2017-11-21 DIAGNOSIS — R945 Abnormal results of liver function studies: Secondary | ICD-10-CM | POA: Diagnosis present

## 2017-11-21 DIAGNOSIS — R000 Tachycardia, unspecified: Secondary | ICD-10-CM | POA: Diagnosis present

## 2017-11-21 DIAGNOSIS — E860 Dehydration: Secondary | ICD-10-CM | POA: Diagnosis present

## 2017-11-21 DIAGNOSIS — K208 Other esophagitis: Secondary | ICD-10-CM | POA: Diagnosis present

## 2017-11-21 DIAGNOSIS — Z86711 Personal history of pulmonary embolism: Secondary | ICD-10-CM

## 2017-11-21 DIAGNOSIS — G8220 Paraplegia, unspecified: Secondary | ICD-10-CM | POA: Diagnosis present

## 2017-11-21 DIAGNOSIS — I10 Essential (primary) hypertension: Secondary | ICD-10-CM | POA: Diagnosis present

## 2017-11-21 DIAGNOSIS — R7989 Other specified abnormal findings of blood chemistry: Secondary | ICD-10-CM

## 2017-11-21 DIAGNOSIS — N529 Male erectile dysfunction, unspecified: Secondary | ICD-10-CM | POA: Diagnosis present

## 2017-11-21 DIAGNOSIS — F10288 Alcohol dependence with other alcohol-induced disorder: Secondary | ICD-10-CM | POA: Diagnosis present

## 2017-11-21 DIAGNOSIS — E876 Hypokalemia: Secondary | ICD-10-CM

## 2017-11-21 DIAGNOSIS — Z87440 Personal history of urinary (tract) infections: Secondary | ICD-10-CM

## 2017-11-21 DIAGNOSIS — D6959 Other secondary thrombocytopenia: Secondary | ICD-10-CM | POA: Diagnosis present

## 2017-11-21 DIAGNOSIS — K920 Hematemesis: Secondary | ICD-10-CM | POA: Diagnosis present

## 2017-11-21 DIAGNOSIS — K760 Fatty (change of) liver, not elsewhere classified: Secondary | ICD-10-CM | POA: Diagnosis present

## 2017-11-21 DIAGNOSIS — F1290 Cannabis use, unspecified, uncomplicated: Secondary | ICD-10-CM | POA: Diagnosis present

## 2017-11-21 DIAGNOSIS — K922 Gastrointestinal hemorrhage, unspecified: Secondary | ICD-10-CM

## 2017-11-21 DIAGNOSIS — K254 Chronic or unspecified gastric ulcer with hemorrhage: Secondary | ICD-10-CM | POA: Diagnosis not present

## 2017-11-21 DIAGNOSIS — R112 Nausea with vomiting, unspecified: Secondary | ICD-10-CM | POA: Diagnosis not present

## 2017-11-21 DIAGNOSIS — Z79899 Other long term (current) drug therapy: Secondary | ICD-10-CM

## 2017-11-21 DIAGNOSIS — Z86718 Personal history of other venous thrombosis and embolism: Secondary | ICD-10-CM

## 2017-11-21 DIAGNOSIS — Z7901 Long term (current) use of anticoagulants: Secondary | ICD-10-CM | POA: Diagnosis not present

## 2017-11-21 LAB — COMPREHENSIVE METABOLIC PANEL
ALT: 338 U/L — AB (ref 17–63)
AST: 306 U/L — ABNORMAL HIGH (ref 15–41)
Albumin: 4.1 g/dL (ref 3.5–5.0)
Alkaline Phosphatase: 90 U/L (ref 38–126)
Anion gap: 17 — ABNORMAL HIGH (ref 5–15)
BUN: 11 mg/dL (ref 6–20)
CHLORIDE: 88 mmol/L — AB (ref 101–111)
CO2: 29 mmol/L (ref 22–32)
CREATININE: 0.91 mg/dL (ref 0.61–1.24)
Calcium: 9.3 mg/dL (ref 8.9–10.3)
GFR calc Af Amer: >60 mL/min (ref >60)
GFR calc non Af Amer: >60 mL/min (ref >60)
Glucose, Bld: 125 mg/dL — ABNORMAL HIGH (ref 65–99)
Potassium: 2.3 mmol/L — CL (ref 3.5–5.1)
Sodium: 134 mmol/L — ABNORMAL LOW (ref 135–145)
Total Bilirubin: 2.6 mg/dL — ABNORMAL HIGH (ref 0.3–1.2)
Total Protein: 8.9 g/dL — ABNORMAL HIGH (ref 6.5–8.1)

## 2017-11-21 LAB — MAGNESIUM
MAGNESIUM: 1.2 mg/dL — AB (ref 1.7–2.4)
Magnesium: 1.7 mg/dL (ref 1.7–2.4)

## 2017-11-21 LAB — BASIC METABOLIC PANEL
Anion gap: 16 — ABNORMAL HIGH (ref 5–15)
BUN: 10 mg/dL (ref 6–20)
CALCIUM: 8.3 mg/dL — AB (ref 8.9–10.3)
CHLORIDE: 96 mmol/L — AB (ref 101–111)
CO2: 24 mmol/L (ref 22–32)
CREATININE: 0.9 mg/dL (ref 0.61–1.24)
GFR calc Af Amer: >60 mL/min (ref >60)
GFR calc non Af Amer: >60 mL/min (ref >60)
Glucose, Bld: 137 mg/dL — ABNORMAL HIGH (ref 65–99)
Potassium: 2.5 mmol/L — CL (ref 3.5–5.1)
Sodium: 136 mmol/L (ref 135–145)

## 2017-11-21 LAB — CBC
HCT: 50.9 % (ref 39.0–52.0)
HCT: 43.8 % (ref 39.0–52.0)
HCT: 43.5 % (ref 39.0–52.0)
HEMATOCRIT: 48 % (ref 39.0–52.0)
HEMOGLOBIN: 16.5 g/dL (ref 13.0–17.0)
HEMOGLOBIN: 15 g/dL (ref 13.0–17.0)
Hemoglobin: 18.1 g/dL — ABNORMAL HIGH (ref 13.0–17.0)
Hemoglobin: 14.9 g/dL (ref 13.0–17.0)
MCH: 30.8 pg (ref 26.0–34.0)
MCH: 30.5 pg (ref 26.0–34.0)
MCH: 30.5 pg (ref 26.0–34.0)
MCH: 30.2 pg (ref 26.0–34.0)
MCHC: 35.6 g/dL (ref 30.0–36.0)
MCHC: 34.4 g/dL (ref 30.0–36.0)
MCHC: 34.2 g/dL (ref 30.0–36.0)
MCHC: 34.3 g/dL (ref 30.0–36.0)
MCV: 86.6 fL (ref 78.0–100.0)
MCV: 88.7 fL (ref 78.0–100.0)
MCV: 89.2 fL (ref 78.0–100.0)
MCV: 88.1 fL (ref 78.0–100.0)
PLATELETS: 145 10*3/uL — AB (ref 150–400)
PLATELETS: 150 10*3/uL (ref 150–400)
PLATELETS: 148 10*3/uL — AB (ref 150–400)
Platelets: 132 10*3/uL — ABNORMAL LOW (ref 150–400)
RBC: 5.88 MIL/uL — ABNORMAL HIGH (ref 4.22–5.81)
RBC: 5.41 MIL/uL (ref 4.22–5.81)
RBC: 4.91 MIL/uL (ref 4.22–5.81)
RBC: 4.94 MIL/uL (ref 4.22–5.81)
RDW: 15.7 % — ABNORMAL HIGH (ref 11.5–15.5)
RDW: 16.1 % — ABNORMAL HIGH (ref 11.5–15.5)
RDW: 16 % — ABNORMAL HIGH (ref 11.5–15.5)
RDW: 15.9 % — AB (ref 11.5–15.5)
WBC: 6.5 10*3/uL (ref 4.0–10.5)
WBC: 6.3 10*3/uL (ref 4.0–10.5)
WBC: 6.4 10*3/uL (ref 4.0–10.5)
WBC: 6.4 10*3/uL (ref 4.0–10.5)

## 2017-11-21 LAB — PROTIME-INR
INR: 3.97
INR: 3.6
Prothrombin Time: 38.4 seconds — ABNORMAL HIGH (ref 11.4–15.2)
Prothrombin Time: 35.6 seconds — ABNORMAL HIGH (ref 11.4–15.2)

## 2017-11-21 LAB — TYPE AND SCREEN
ABO/RH(D): B POS
Antibody Screen: NEGATIVE

## 2017-11-21 LAB — APTT: aPTT: 44 seconds — ABNORMAL HIGH (ref 24–36)

## 2017-11-21 LAB — TSH: TSH: 1.928 u[IU]/mL (ref 0.350–4.500)

## 2017-11-21 LAB — RAPID URINE DRUG SCREEN, HOSP PERFORMED
AMPHETAMINES: NOT DETECTED
BARBITURATES: NOT DETECTED
Benzodiazepines: POSITIVE — AB
COCAINE: NOT DETECTED
Opiates: NOT DETECTED
Tetrahydrocannabinol: POSITIVE — AB

## 2017-11-21 LAB — POC OCCULT BLOOD, ED: FECAL OCCULT BLD: NEGATIVE

## 2017-11-21 LAB — MRSA PCR SCREENING: MRSA by PCR: NEGATIVE

## 2017-11-21 LAB — POTASSIUM: Potassium: 2.9 mmol/L — ABNORMAL LOW (ref 3.5–5.1)

## 2017-11-21 LAB — LIPASE, BLOOD: Lipase: 21 U/L (ref 11–51)

## 2017-11-21 LAB — ABO/RH: ABO/RH(D): B POS

## 2017-11-21 MED ORDER — SODIUM CHLORIDE 0.9 % IV SOLN
50.0000 ug/h | INTRAVENOUS | Status: DC
  Administered 2017-11-21: 50 ug/h via INTRAVENOUS
  Filled 2017-11-21 (×2): qty 1

## 2017-11-21 MED ORDER — FOLIC ACID 1 MG PO TABS
1.0000 mg | ORAL_TABLET | Freq: Every day | ORAL | Status: DC
  Administered 2017-11-21 – 2017-11-23 (×3): 1 mg via ORAL
  Filled 2017-11-21 (×3): qty 1

## 2017-11-21 MED ORDER — POTASSIUM CHLORIDE CRYS ER 20 MEQ PO TBCR
60.0000 meq | EXTENDED_RELEASE_TABLET | Freq: Once | ORAL | Status: AC
  Administered 2017-11-21: 60 meq via ORAL
  Filled 2017-11-21: qty 3

## 2017-11-21 MED ORDER — SODIUM CHLORIDE 0.9 % IV BOLUS
1000.0000 mL | Freq: Once | INTRAVENOUS | Status: AC
  Administered 2017-11-21: 1000 mL via INTRAVENOUS

## 2017-11-21 MED ORDER — OCTREOTIDE LOAD VIA INFUSION
50.0000 ug | Freq: Once | INTRAVENOUS | Status: AC
  Administered 2017-11-21: 50 ug via INTRAVENOUS
  Filled 2017-11-21: qty 25

## 2017-11-21 MED ORDER — ONDANSETRON HCL 4 MG PO TABS
4.0000 mg | ORAL_TABLET | Freq: Four times a day (QID) | ORAL | Status: DC | PRN
Start: 2017-11-21 — End: 2017-11-23

## 2017-11-21 MED ORDER — ACETAMINOPHEN 325 MG PO TABS: 650.0000 mg | ORAL_TABLET | Freq: Four times a day (QID) | ORAL | Status: DC | PRN

## 2017-11-21 MED ORDER — THIAMINE HCL 100 MG/ML IJ SOLN: 100.0000 mg | Freq: Every day | INTRAMUSCULAR | Status: DC

## 2017-11-21 MED ORDER — POTASSIUM CHLORIDE 10 MEQ/100ML IV SOLN
10.0000 meq | INTRAVENOUS | Status: AC
Start: 2017-11-21 — End: 2017-11-21
  Administered 2017-11-21 (×3): 10 meq via INTRAVENOUS
  Filled 2017-11-21 (×3): qty 100

## 2017-11-21 MED ORDER — SODIUM CHLORIDE 0.9 % IV SOLN
8.0000 mg/h | INTRAVENOUS | Status: DC
  Administered 2017-11-21 – 2017-11-22 (×3): 8 mg/h via INTRAVENOUS
  Filled 2017-11-21 (×6): qty 80

## 2017-11-21 MED ORDER — MAGNESIUM SULFATE IN D5W 1-5 GM/100ML-% IV SOLN
1.0000 g | Freq: Once | INTRAVENOUS | Status: AC
  Administered 2017-11-21: 1 g via INTRAVENOUS
  Filled 2017-11-21: qty 100

## 2017-11-21 MED ORDER — SODIUM CHLORIDE 0.9 % IV SOLN
INTRAVENOUS | Status: DC
  Administered 2017-11-21: 15:00:00 via INTRAVENOUS

## 2017-11-21 MED ORDER — LORAZEPAM 1 MG PO TABS: 1.0000 mg | ORAL_TABLET | Freq: Four times a day (QID) | ORAL | Status: DC | PRN

## 2017-11-21 MED ORDER — MAGNESIUM SULFATE 2 GM/50ML IV SOLN
2.0000 g | Freq: Once | INTRAVENOUS | Status: AC
  Administered 2017-11-21: 2 g via INTRAVENOUS
  Filled 2017-11-21: qty 50

## 2017-11-21 MED ORDER — ALPRAZOLAM 1 MG PO TABS
1.0000 mg | ORAL_TABLET | Freq: Four times a day (QID) | ORAL | Status: DC
  Administered 2017-11-22 – 2017-11-23 (×5): 1 mg via ORAL
  Filled 2017-11-21 (×6): qty 1

## 2017-11-21 MED ORDER — SODIUM CHLORIDE 0.9 % IV SOLN
INTRAVENOUS | Status: DC
  Administered 2017-11-21: 04:00:00 via INTRAVENOUS

## 2017-11-21 MED ORDER — SODIUM CHLORIDE 0.9 % IV SOLN
INTRAVENOUS | Status: DC
  Administered 2017-11-21 (×2): via INTRAVENOUS
  Filled 2017-11-21 (×4): qty 1000

## 2017-11-21 MED ORDER — ONDANSETRON HCL 4 MG/2ML IJ SOLN: 4.0000 mg | Freq: Four times a day (QID) | INTRAMUSCULAR | Status: DC | PRN

## 2017-11-21 MED ORDER — LORAZEPAM 2 MG/ML IJ SOLN: 1.0000 mg | Freq: Four times a day (QID) | INTRAMUSCULAR | Status: DC | PRN

## 2017-11-21 MED ORDER — SODIUM CHLORIDE 0.9 % IV SOLN
80.0000 mg | Freq: Once | INTRAVENOUS | Status: AC
  Administered 2017-11-21: 80 mg via INTRAVENOUS
  Filled 2017-11-21: qty 80

## 2017-11-21 MED ORDER — LEVALBUTEROL HCL 0.63 MG/3ML IN NEBU: 0.6300 mg | INHALATION_SOLUTION | Freq: Four times a day (QID) | RESPIRATORY_TRACT | Status: DC | PRN

## 2017-11-21 MED ORDER — VITAMIN B-1 100 MG PO TABS
100.0000 mg | ORAL_TABLET | Freq: Every day | ORAL | Status: DC
  Administered 2017-11-21 – 2017-11-23 (×3): 100 mg via ORAL
  Filled 2017-11-21 (×3): qty 1

## 2017-11-21 MED ORDER — ADULT MULTIVITAMIN W/MINERALS CH
1.0000 | ORAL_TABLET | Freq: Every day | ORAL | Status: DC
  Administered 2017-11-21 – 2017-11-23 (×3): 1 via ORAL
  Filled 2017-11-21 (×3): qty 1

## 2017-11-21 MED ORDER — ACETAMINOPHEN 650 MG RE SUPP: 650.0000 mg | Freq: Four times a day (QID) | RECTAL | Status: DC | PRN
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
## 2017-11-21 NOTE — Progress Notes (Signed)
CRITICAL VALUE ALERT  Critical Value:  K +  Date & Time Notied:  11/21/17  Provider Notified: yes  Orders Received/Actions taken:Oral potassium ordered.
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
## 2017-11-21 NOTE — ED Notes (Signed)
Spoke with lab, lab to add on APTT

## 2017-11-21 NOTE — ED Triage Notes (Addendum)
Pt reports having vomiting for the last 2 weeks and within a week blood began to appear in emesis. Pt reports that he had a history of blood clots. Pt reports being on Coumadin.
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
## 2017-11-22 DIAGNOSIS — K920 Hematemesis: Principal | ICD-10-CM

## 2017-11-22 LAB — URINALYSIS, ROUTINE W REFLEX MICROSCOPIC
Bilirubin Urine: NEGATIVE
GLUCOSE, UA: NEGATIVE mg/dL
Hgb urine dipstick: NEGATIVE
Ketones, ur: NEGATIVE mg/dL
LEUKOCYTES UA: NEGATIVE
NITRITE: NEGATIVE
PROTEIN: NEGATIVE mg/dL
Specific Gravity, Urine: 1.012 (ref 1.005–1.030)
pH: 8 (ref 5.0–8.0)

## 2017-11-22 LAB — CBC
HCT: 42.3 % (ref 39.0–52.0)
HCT: 43 % (ref 39.0–52.0)
HEMATOCRIT: 40.2 % (ref 39.0–52.0)
HEMOGLOBIN: 14.6 g/dL (ref 13.0–17.0)
Hemoglobin: 13.8 g/dL (ref 13.0–17.0)
Hemoglobin: 14.7 g/dL (ref 13.0–17.0)
MCH: 30.6 pg (ref 26.0–34.0)
MCH: 30.7 pg (ref 26.0–34.0)
MCH: 30.5 pg (ref 26.0–34.0)
MCHC: 34.5 g/dL (ref 30.0–36.0)
MCHC: 34.3 g/dL (ref 30.0–36.0)
MCHC: 34.2 g/dL (ref 30.0–36.0)
MCV: 88.7 fL (ref 78.0–100.0)
MCV: 89.3 fL (ref 78.0–100.0)
MCV: 89.2 fL (ref 78.0–100.0)
PLATELETS: 167 10*3/uL (ref 150–400)
PLATELETS: 183 10*3/uL (ref 150–400)
Platelets: 155 10*3/uL (ref 150–400)
RBC: 4.77 MIL/uL (ref 4.22–5.81)
RBC: 4.5 MIL/uL (ref 4.22–5.81)
RBC: 4.82 MIL/uL (ref 4.22–5.81)
RDW: 16.1 % — ABNORMAL HIGH (ref 11.5–15.5)
RDW: 16.3 % — AB (ref 11.5–15.5)
RDW: 16.3 % — AB (ref 11.5–15.5)
WBC: 6 10*3/uL (ref 4.0–10.5)
WBC: 5.9 10*3/uL (ref 4.0–10.5)
WBC: 5.3 10*3/uL (ref 4.0–10.5)

## 2017-11-22 LAB — COMPREHENSIVE METABOLIC PANEL
ALBUMIN: 3.3 g/dL — AB (ref 3.5–5.0)
ALK PHOS: 62 U/L (ref 38–126)
ALT: 195 U/L — AB (ref 17–63)
ANION GAP: 10 (ref 5–15)
AST: 134 U/L — ABNORMAL HIGH (ref 15–41)
BUN: 8 mg/dL (ref 6–20)
CALCIUM: 8.5 mg/dL — AB (ref 8.9–10.3)
CHLORIDE: 103 mmol/L (ref 101–111)
CO2: 23 mmol/L (ref 22–32)
Creatinine, Ser: 0.61 mg/dL (ref 0.61–1.24)
GFR calc non Af Amer: >60 mL/min (ref >60)
GLUCOSE: 125 mg/dL — AB (ref 65–99)
Potassium: 3 mmol/L — ABNORMAL LOW (ref 3.5–5.1)
Sodium: 136 mmol/L (ref 135–145)
Total Bilirubin: 2.5 mg/dL — ABNORMAL HIGH (ref 0.3–1.2)
Total Protein: 6.9 g/dL (ref 6.5–8.1)

## 2017-11-22 LAB — PROTIME-INR
INR: 2.75
Prothrombin Time: 28.9 seconds — ABNORMAL HIGH (ref 11.4–15.2)

## 2017-11-22 MED ORDER — POTASSIUM CHLORIDE CRYS ER 20 MEQ PO TBCR
40.0000 meq | EXTENDED_RELEASE_TABLET | Freq: Once | ORAL | Status: AC
  Administered 2017-11-22: 40 meq via ORAL
  Filled 2017-11-22: qty 2

## 2017-11-22 MED ORDER — HYDRALAZINE HCL 20 MG/ML IJ SOLN
10.0000 mg | Freq: Once | INTRAMUSCULAR | Status: AC
  Administered 2017-11-22: 10 mg via INTRAVENOUS
  Filled 2017-11-22: qty 1

## 2017-11-22 MED ORDER — WARFARIN SODIUM 5 MG PO TABS
5.0000 mg | ORAL_TABLET | Freq: Once | ORAL | Status: AC
  Administered 2017-11-22: 5 mg via ORAL
  Filled 2017-11-22: qty 1

## 2017-11-22 MED ORDER — PANTOPRAZOLE SODIUM 40 MG IV SOLR
40.0000 mg | Freq: Two times a day (BID) | INTRAVENOUS | Status: DC
Start: 2017-11-22 — End: 2017-11-23
  Administered 2017-11-22 – 2017-11-23 (×3): 40 mg via INTRAVENOUS
  Filled 2017-11-22 (×3): qty 40

## 2017-11-22 MED ORDER — LIP MEDEX EX OINT
TOPICAL_OINTMENT | CUTANEOUS | Status: DC | PRN
  Administered 2017-11-22: 05:00:00 via TOPICAL

## 2017-11-22 MED ORDER — HYDRALAZINE HCL 20 MG/ML IJ SOLN: 5.0000 mg | Freq: Four times a day (QID) | INTRAMUSCULAR | Status: DC | PRN

## 2017-11-22 MED ORDER — AMLODIPINE BESYLATE 10 MG PO TABS
10.0000 mg | ORAL_TABLET | Freq: Every day | ORAL | Status: DC
  Administered 2017-11-22 – 2017-11-23 (×2): 10 mg via ORAL
  Filled 2017-11-22 (×2): qty 1

## 2017-11-22 MED ORDER — WARFARIN - PHARMACIST DOSING INPATIENT: Freq: Every day | Status: DC

## 2017-11-22 MED ORDER — LIP MEDEX EX OINT
TOPICAL_OINTMENT | CUTANEOUS | Status: AC
  Filled 2017-11-22: qty 7

## 2017-11-22 NOTE — Progress Notes (Signed)
ANTICOAGULATION CONSULT NOTE  Pharmacy Consult for warfarin Indication:hx pulmonary embolus and DVT  No Known Allergies  Patient Measurements: Height:  (198.1 cm) Weight: 200 lb (90.7 kg) IBW/kg (Calculated) : 91.4 Heparin Dosing Weight:   Vital Signs: Temp: 98.8 F (37.1 C) (05/28 0714) Temp Source: Oral (05/28 0714) BP: 163/102 (05/28 0400) Pulse Rate: 99 (05/28 0400)  Labs: Recent Labs    11/21/17 0205 11/21/17 1020 11/21/17 1344 11/21/17 2049 11/22/17 0400  HGB 18.1* 16.5 15.0 14.9 14.6  HCT 50.9 48.0 43.8 43.5 42.3  PLT 145* 132* 150 148* 155  APTT 44*  --   --   --   --   LABPROT 38.4* 35.6*  --   --   --   INR 3.97 3.60  --   --   --   CREATININE 0.91 0.90  --   --  0.61    Estimated Creatinine Clearance: 160.6 mL/min (by C-G formula based on SCr of 0.61 mg/dL).   Medications:  PTA warfarin regimen: pt reported he took whatever dose he wanted at home, most days take 7.5 mg BUT [facility] clinic note on 3/26 said dose was 7.5mg  daily except 5 mg on mon and Fri  Assessment: Patient is a 37 y.o M with hx paraplegia, EtOH abuse, and DVT/PE on warfarin PTA, presented to the ED on 5/27 with c/o n/v and episodes of emesis with blood streaks. MD suspects hematemesis is secondary to EtOH and esophagitis.  To resume warfarin on 5/28.  Today, 11/22/2017: - INR now therapeutic at 2.75 (no reversal given) - cbc stable - no significant drug-drug inxns  Goal of Therapy:  INR 2-3 Monitor platelets by anticoagulation protocol: Yes   Plan:  - warfarin 5 mg PO x1 - daily INR - monitor for s/s bleeding  Mikhi Athey P 11/22/2017,9:44 AM
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
## 2017-11-23 LAB — HEPATITIS PANEL, ACUTE
HCV Ab: <0.1 s/co ratio (ref 0.0–0.9)
HEP A IGM: NEGATIVE
HEP B C IGM: NEGATIVE
Hepatitis B Surface Ag: NEGATIVE

## 2017-11-23 LAB — COMPREHENSIVE METABOLIC PANEL
ALBUMIN: 3.3 g/dL — AB (ref 3.5–5.0)
ALK PHOS: 75 U/L (ref 38–126)
ALT: 160 U/L — AB (ref 17–63)
AST: 88 U/L — AB (ref 15–41)
Anion gap: 10 (ref 5–15)
BILIRUBIN TOTAL: 1.9 mg/dL — AB (ref 0.3–1.2)
BUN: 8 mg/dL (ref 6–20)
CALCIUM: 9.2 mg/dL (ref 8.9–10.3)
CO2: 24 mmol/L (ref 22–32)
CREATININE: 0.69 mg/dL (ref 0.61–1.24)
Chloride: 99 mmol/L — ABNORMAL LOW (ref 101–111)
GFR calc Af Amer: >60 mL/min (ref >60)
GLUCOSE: 107 mg/dL — AB (ref 65–99)
Potassium: 3.4 mmol/L — ABNORMAL LOW (ref 3.5–5.1)
Sodium: 133 mmol/L — ABNORMAL LOW (ref 135–145)
TOTAL PROTEIN: 6.9 g/dL (ref 6.5–8.1)

## 2017-11-23 LAB — CBC
HEMATOCRIT: 43.9 % (ref 39.0–52.0)
Hemoglobin: 15.4 g/dL (ref 13.0–17.0)
MCH: 30.9 pg (ref 26.0–34.0)
MCHC: 35.1 g/dL (ref 30.0–36.0)
MCV: 88.2 fL (ref 78.0–100.0)
Platelets: 210 10*3/uL (ref 150–400)
RBC: 4.98 MIL/uL (ref 4.22–5.81)
RDW: 16.4 % — AB (ref 11.5–15.5)
WBC: 4.7 10*3/uL (ref 4.0–10.5)

## 2017-11-23 LAB — MAGNESIUM: MAGNESIUM: 1.5 mg/dL — AB (ref 1.7–2.4)

## 2017-11-23 LAB — PROTIME-INR
INR: 2.44
Prothrombin Time: 26.3 seconds — ABNORMAL HIGH (ref 11.4–15.2)

## 2017-11-23 LAB — URINE CULTURE: Culture: NO GROWTH

## 2017-11-23 MED ORDER — PANTOPRAZOLE SODIUM 40 MG PO TBEC: 40.0000 mg | DELAYED_RELEASE_TABLET | Freq: Every day | ORAL | Status: DC

## 2017-11-23 MED ORDER — MAGNESIUM GLUCONATE 500 MG PO TABS
500.0000 mg | ORAL_TABLET | Freq: Every day | ORAL | Status: DC
  Administered 2017-11-23: 500 mg via ORAL
  Filled 2017-11-23: qty 1

## 2017-11-23 MED ORDER — PANTOPRAZOLE SODIUM 40 MG PO TBEC: 40.0000 mg | DELAYED_RELEASE_TABLET | Freq: Every day | ORAL | 0 refills | Status: DC

## 2017-11-23 MED ORDER — ALPRAZOLAM 1 MG PO TABS: 1.0000 mg | ORAL_TABLET | Freq: Two times a day (BID) | ORAL | 0 refills | Status: DC | PRN

## 2017-11-23 MED ORDER — MAGNESIUM SULFATE 2 GM/50ML IV SOLN
2.0000 g | Freq: Once | INTRAVENOUS | Status: DC
  Filled 2017-11-23: qty 50

## 2017-11-23 MED ORDER — AMLODIPINE BESYLATE 5 MG PO TABS: 5.0000 mg | ORAL_TABLET | Freq: Every day | ORAL | 0 refills | Status: DC

## 2017-11-23 MED ORDER — WARFARIN SODIUM 5 MG PO TABS: 5.0000 mg | ORAL_TABLET | Freq: Once | ORAL | Status: DC

## 2017-11-23 MED ORDER — MAGNESIUM GLUCONATE 500 MG PO TABS: 500.0000 mg | ORAL_TABLET | Freq: Every day | ORAL | 0 refills | Status: DC

## 2017-11-23 MED ORDER — THIAMINE HCL 100 MG PO TABS: 100.0000 mg | ORAL_TABLET | Freq: Every day | ORAL | 0 refills | Status: DC

## 2017-11-23 MED ORDER — ADULT MULTIVITAMIN W/MINERALS CH: 1.0000 | ORAL_TABLET | Freq: Every day | ORAL | 0 refills | Status: DC

## 2017-11-23 MED ORDER — POTASSIUM CHLORIDE CRYS ER 20 MEQ PO TBCR
40.0000 meq | EXTENDED_RELEASE_TABLET | Freq: Two times a day (BID) | ORAL | Status: DC
  Administered 2017-11-23: 40 meq via ORAL
  Filled 2017-11-23: qty 2

## 2017-11-23 MED ORDER — FOLIC ACID 1 MG PO TABS: 1.0000 mg | ORAL_TABLET | Freq: Every day | ORAL | 0 refills | Status: DC

## 2017-11-23 NOTE — Clinical Social Work Note (Signed)
Clinical Social Work Assessment  Patient Details  Name: John Figueroa MRN: [id number] Date of Birth: 10/30/1980  Date of referral:  11/23/17               Reason for consult:  Substance Use/ETOH Abuse                Permission sought to share information with:    Permission granted to share information::     Name::        Agency::     Relationship::     Contact Information:     Housing/Transportation Living arrangements for the past 2 months:  Single Family Home Source of Information:  Patient Patient Interpreter Needed:  None Criminal Activity/Legal Involvement Pertinent to Current Situation/Hospitalization:  No - Comment as needed Significant Relationships:  Other Family Members, Friend, Siblings Lives with:    Do you feel safe going back to the place where you live?  Yes Need for family participation in patient care:  No (Coment)  Care giving concerns:  n/a Social Worker assessment / plan:  CSW consulted to assess alcohol use issues. Met with pt at bedside- he was alert and welcoming of CSW involvement. However, he quickly denied any needs re: his etoh use. States, "Before I was admitted I've been drinking about a fifth a day. I had some withdrawal here but that's done now. I am going to quit, I've done it before and I can do it again. My family will be happy if I quit and stop hanging out with my buddies who drink like this too." Pt was vague re: history of use other than having been through treatment programs and involved in AA in the past. States, "None of that helps me. If you want to quit you can quit and if you don't want to change you won't, it all comes down to that." CSW encouraged him to engage in treatment and create support support network again, however he declined several times, stating that his family support and will to quit is high at this time. "I don't want it to affect my health, I don't have time to be in the hospital like this."  Plan: Pt will DC home,  declines any SA treatment resources stating he is familiar with programs and options and plans to achieve sobriety with support of his family.    Employment status:  Full-Time Insurance information:  Managed Care([employer]) PT Recommendations:  Not assessed at this time Information / Referral to community resources:     Patient/Family's Response to care:  Pt appreciative of care received, states "I hope I don't need to be in the hospital again"  Patient/Family's Understanding of and Emotional Response to Diagnosis, Current Treatment, and Prognosis:  Pt shows adequate understanding of his treatment and prognosis however was guarded when it comes to details of alcohol use. Was engaged however repeated twice "I am good, I don't need any help with this"  Emotional Assessment Appearance:  Appears stated age Attitude/Demeanor/Rapport:  Engaged Affect (typically observed):  Guarded Orientation:  Oriented to Self, Oriented to Place, Oriented to  Time, Oriented to Situation Alcohol / Substance use:  Not Applicable Psych involvement (Current and /or in the community):  No (Comment)  Discharge Needs  Concerns to be addressed:  Substance Abuse Concerns Readmission within the last 30 days:  No Current discharge risk:  Substance Abuse Barriers to Discharge:  No Barriers Identified   Meghan R Stout, LCSW 11/23/2017, 10:38 AM [phone] 

## 2017-11-23 NOTE — Discharge Instructions (Signed)
Gastrointestinal Bleeding Gastrointestinal bleeding is bleeding somewhere along the path food travels through the body (digestive tract). This path is anywhere between the mouth and the opening of the butt (anus). You may have blood in your poop (stools) or have black poop. If you throw up (vomit), there may be blood in it. This condition can be mild, serious, or even life-threatening. If you have a lot of bleeding, you may need to stay in the hospital. Follow these instructions at home:  Take over-the-counter and prescription medicines only as told by your doctor.  Eat foods that have a lot of fiber in them. These foods include whole grains, fruits, and vegetables. You can also try eating 1-3 prunes each day.  Drink enough fluid to keep your pee (urine) clear or pale yellow.  Keep all follow-up visits as told by your doctor. This is important. Contact a doctor if:  Your symptoms do not get better. Get help right away if:  Your bleeding gets worse.  You feel dizzy or you pass out (faint).  You feel weak.  You have very bad cramps in your back or belly (abdomen).  You pass large clumps of blood (clots) in your poop.  Your symptoms are getting worse. This information is not intended to replace advice given to you by your health care provider. Make sure you discuss any questions you have with your health care provider. Document Released: 03/23/2008 Document Revised: 11/20/2015 Document Reviewed: 12/02/2014 Elsevier Interactive Patient Education  2018 [employer].  Hematemesis Hematemesis is when you vomit blood. It is a sign of bleeding in the upper part of your digestive tract. This is also called your gastrointestinal (GI) tract. Your upper GI tract includes your mouth, throat, esophagus, stomach, and the first part of your small intestine (duodenum). Hematemesis is usually caused by bleeding from your esophagus or stomach. You may suddenly vomit bright red blood. You might also  vomit old blood. It may look like coffee grounds. You may also have other symptoms, such as:  Stomach pain.  Heartburn.  Black and tarry stool.  Follow these instructions at home: Watch your hematemesis for any changes. The following actions may help to lessen any discomfort you are feeling:  Take medicines only as directed by your health care provider. Do not take aspirin, ibuprofen, or any other anti-inflammatory medicine without approval from your health care provider.  Rest as needed.  Drink small sips of clear liquids often, as long as you can keep them down. Try to drink enough fluids to keep your urine clear or pale yellow.  Do not drink alcohol.  Do not use any tobacco products, including cigarettes, chewing tobacco, or electronic cigarettes. If you need help quitting, ask your health care provider.  Keep all follow-up visits as directed by your health care provider. This is important.  Contact a health care provider if:  The vomiting of blood worsens, or begins again after it has stopped.  You have persistent stomach pain.  You have nausea, indigestion, or heartburn.  You feel weak or dizzy. Get help right away if:  You faint or feel extremely weak.  You have a rapid heartbeat.  You are urinating less than normal or not at all.  You have persistent vomiting.  You vomit large amounts of bloody or dark material.  You vomit bright red blood.  You pass large, dark, or bloody stools.  You have chest pain or trouble breathing. This information is not intended to replace advice given to  you by your health care provider. Make sure you discuss any questions you have with your health care provider. Document Released: 07/22/2004 Document Revised: 11/20/2015 Document Reviewed: 02/06/2014 Elsevier Interactive Patient Education  2018 [employer].   Hypokalemia Hypokalemia means that the amount of potassium in the blood is lower than normal.Potassium is a chemical  that helps regulate the amount of fluid in the body (electrolyte). It also stimulates muscle tightening (contraction) and helps nerves work properly.Normally, most of the bodys potassium is inside of cells, and only a very small amount is in the blood. Because the amount in the blood is so small, minor changes to potassium levels in the blood can be life-threatening. What are the causes? This condition may be caused by:  Antibiotic medicine.  Diarrhea or vomiting. Taking too much of a medicine that helps you have a bowel movement (laxative) can cause diarrhea and lead to hypokalemia.  Chronic kidney disease (CKD).  Medicines that help the body get rid of excess fluid (diuretics).  Eating disorders, such as bulimia.  Low magnesium levels in the body.  Sweating a lot.  What are the signs or symptoms? Symptoms of this condition include:  Weakness.  Constipation.  Fatigue.  Muscle cramps.  Mental confusion.  Skipped heartbeats or irregular heartbeat (palpitations).  Tingling or numbness.  How is this diagnosed? This condition is diagnosed with a blood test. How is this treated? Hypokalemia can be treated by taking potassium supplements by mouth or adjusting the medicines that you take. Treatment may also include eating more foods that contain a lot of potassium. If your potassium level is very low, you may need to get potassium through an IV tube in one of your veins and be monitored in the hospital. Follow these instructions at home:  Take over-the-counter and prescription medicines only as told by your health care provider. This includes vitamins and supplements.  Eat a healthy diet. A healthy diet includes fresh fruits and vegetables, whole grains, healthy fats, and lean proteins.  If instructed, eat more foods that contain a lot of potassium, such as: ? Nuts, such as peanuts and pistachios. ? Seeds, such as sunflower seeds and pumpkin seeds. ? Peas, lentils, and lima  beans. ? Whole grain and bran cereals and breads. ? Fresh fruits and vegetables, such as apricots, avocado, bananas, cantaloupe, kiwi, oranges, tomatoes, asparagus, and potatoes. ? Orange juice. ? Tomato juice. ? Red meats. ? Yogurt.  Keep all follow-up visits as told by your health care provider. This is important. Contact a health care provider if:  You have weakness that gets worse.  You feel your heart pounding or racing.  You vomit.  You have diarrhea.  You have diabetes (diabetes mellitus) and you have trouble keeping your blood sugar (glucose) in your target range. Get help right away if:  You have chest pain.  You have shortness of breath.  You have vomiting or diarrhea that lasts for more than 2 days.  You faint. This information is not intended to replace advice given to you by your health care provider. Make sure you discuss any questions you have with your health care provider. Document Released: 06/14/2005 Document Revised: 01/31/2016 Document Reviewed: 01/31/2016 Elsevier Interactive Patient Education  2018 [employer].

## 2017-11-23 NOTE — Progress Notes (Signed)
ANTICOAGULATION CONSULT NOTE  Pharmacy Consult for warfarin Indication:hx pulmonary embolus and DVT  No Known Allergies  Patient Measurements: Height:  (198.1 cm) Weight: 197 lb (89.4 kg) IBW/kg (Calculated) : 91.4 Heparin Dosing Weight:   Vital Signs: Temp: 98.1 F (36.7 C) (05/29 0553) Temp Source: Oral (05/29 0553) BP: 132/85 (05/29 0553) Pulse Rate: 100 (05/29 0553)  Labs: Recent Labs    11/21/17 0205 11/21/17 1020  11/22/17 0400 11/22/17 0937 11/22/17 1228 11/22/17 2048 11/23/17 0455  HGB 18.1* 16.5   < > 14.6  --  13.8 14.7 15.4  HCT 50.9 48.0   < > 42.3  --  40.2 43.0 43.9  PLT 145* 132*   < > 155  --  167 183 210  APTT 44*  --   --   --   --   --   --   --   LABPROT 38.4* 35.6*  --   --  28.9*  --   --  26.3*  INR 3.97 3.60  --   --  2.75  --   --  2.44  CREATININE 0.91 0.90  --  0.61  --   --   --  0.69   < > = values in this interval not displayed.    Estimated Creatinine Clearance: 158.3 mL/min (by C-G formula based on SCr of 0.69 mg/dL).   Medications:  PTA warfarin regimen: pt reported he took whatever dose he wanted at home, most days take 7.5 mg BUT AC clinic note on 3/26 said dose was 7.5mg  daily except 5 mg on mon and Fri  Assessment: Patient is a 37 y.o M with hx paraplegia, EtOH abuse, and DVT/PE on warfarin PTA, presented to the ED on 5/27 with c/o n/v and episodes of emesis with blood streaks. MD suspects hematemesis is secondary to EtOH and esophagitis.  To resume warfarin on 5/28.  Today, 11/23/2017:  INR continues to be therapeutic   CBC stable  No significant drug-drug interaction  No reported bleeding  Goal of Therapy:  INR 2-3 Monitor platelets by anticoagulation protocol: Yes   Plan:  1) Repeat warfarin  today at 1800 2) Daily INR   Hessie Knows, PharmD, BCPS Pager [phone] 11/23/2017 8:48 AM
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
## 2017-12-22 ENCOUNTER — Telehealth: Payer: Self-pay | Admitting: Internal Medicine

## 2017-12-22 NOTE — Telephone Encounter (Signed)
Copied from CRM [id number]. Topic: General - Other >> Dec 22, 2017 12:34 PM Stephannie LiSimmons, Janett L, NT wrote: Reason for CRM: Patients [employer] case manager  called and said she wanted to leave her information , Melissa  [phone] x [phone] please put in patients chart for future use if needed

## 2017-12-22 NOTE — Telephone Encounter (Signed)
Tammy could you enter this info in a FYI? Thanks!

## 2018-01-02 ENCOUNTER — Encounter: Payer: Self-pay | Admitting: Internal Medicine

## 2018-01-02 ENCOUNTER — Ambulatory Visit: Payer: Managed Care, Other (non HMO) | Admitting: Internal Medicine

## 2018-01-02 ENCOUNTER — Other Ambulatory Visit (INDEPENDENT_AMBULATORY_CARE_PROVIDER_SITE_OTHER): Payer: Managed Care, Other (non HMO)

## 2018-01-02 VITALS — BP 132/88 | HR 94 | Ht 78.0 in

## 2018-01-02 DIAGNOSIS — E876 Hypokalemia: Secondary | ICD-10-CM

## 2018-01-02 DIAGNOSIS — R7989 Other specified abnormal findings of blood chemistry: Secondary | ICD-10-CM

## 2018-01-02 DIAGNOSIS — K920 Hematemesis: Secondary | ICD-10-CM | POA: Diagnosis not present

## 2018-01-02 DIAGNOSIS — F1010 Alcohol abuse, uncomplicated: Secondary | ICD-10-CM | POA: Diagnosis not present

## 2018-01-02 DIAGNOSIS — D696 Thrombocytopenia, unspecified: Secondary | ICD-10-CM | POA: Diagnosis not present

## 2018-01-02 DIAGNOSIS — R945 Abnormal results of liver function studies: Secondary | ICD-10-CM

## 2018-01-02 LAB — BASIC METABOLIC PANEL
BUN: 6 mg/dL (ref 6–23)
CALCIUM: 9.5 mg/dL (ref 8.4–10.5)
CO2: 28 meq/L (ref 19–32)
CREATININE: 0.76 mg/dL (ref 0.40–1.50)
Chloride: 97 mEq/L (ref 96–112)
GFR: 146.8 mL/min (ref >60.00)
Glucose, Bld: 90 mg/dL (ref 70–99)
Potassium: 3.6 mEq/L (ref 3.5–5.1)
Sodium: 136 mEq/L (ref 135–145)

## 2018-01-02 LAB — CBC WITH DIFFERENTIAL/PLATELET
BASOS ABS: 0 10*3/uL (ref 0.0–0.1)
Basophils Relative: 1 % (ref 0.0–3.0)
EOS PCT: 5.5 % — AB (ref 0.0–5.0)
Eosinophils Absolute: 0.3 10*3/uL (ref 0.0–0.7)
HEMATOCRIT: 46 % (ref 39.0–52.0)
HEMOGLOBIN: 15.7 g/dL (ref 13.0–17.0)
LYMPHS PCT: 13.7 % (ref 12.0–46.0)
Lymphs Abs: 0.7 10*3/uL (ref 0.7–4.0)
MCHC: 34.2 g/dL (ref 30.0–36.0)
MCV: 90.6 fl (ref 78.0–100.0)
MONOS PCT: 14.5 % — AB (ref 3.0–12.0)
Monocytes Absolute: 0.8 10*3/uL (ref 0.1–1.0)
Neutro Abs: 3.4 10*3/uL (ref 1.4–7.7)
Neutrophils Relative %: 65.3 % (ref 43.0–77.0)
Platelets: 339 10*3/uL (ref 150.0–400.0)
RBC: 5.08 Mil/uL (ref 4.22–5.81)
RDW: 16.2 % — ABNORMAL HIGH (ref 11.5–15.5)
WBC: 5.2 10*3/uL (ref 4.0–10.5)

## 2018-01-02 LAB — HEPATIC FUNCTION PANEL
ALBUMIN: 4.2 g/dL (ref 3.5–5.2)
ALK PHOS: 69 U/L (ref 39–117)
ALT: 27 U/L (ref 0–53)
AST: 43 U/L — AB (ref 0–37)
Bilirubin, Direct: 0.2 mg/dL (ref 0.0–0.3)
TOTAL PROTEIN: 8.1 g/dL (ref 6.0–8.3)
Total Bilirubin: 0.9 mg/dL (ref 0.2–1.2)

## 2018-01-02 MED ORDER — FOLIC ACID 1 MG PO TABS: 1.0000 mg | ORAL_TABLET | Freq: Every day | ORAL | 0 refills | Status: DC

## 2018-01-02 MED ORDER — AMLODIPINE BESYLATE 5 MG PO TABS: 5.0000 mg | ORAL_TABLET | Freq: Every day | ORAL | 3 refills | Status: DC

## 2018-01-02 MED ORDER — SILVER SULFADIAZINE 1 % EX CREA: 1.0000 "application " | TOPICAL_CREAM | Freq: Every day | CUTANEOUS | 0 refills | Status: DC

## 2018-01-02 MED ORDER — PANTOPRAZOLE SODIUM 40 MG PO TBEC: 40.0000 mg | DELAYED_RELEASE_TABLET | Freq: Every day | ORAL | 3 refills | Status: DC

## 2018-01-02 MED ORDER — MAGNESIUM GLUCONATE 500 MG PO TABS
500.0000 mg | ORAL_TABLET | Freq: Every day | ORAL | 0 refills | Status: DC
Start: 2018-01-02 — End: 2020-03-10

## 2018-01-02 MED ORDER — CIPROFLOXACIN HCL 500 MG PO TABS: 500.0000 mg | ORAL_TABLET | Freq: Two times a day (BID) | ORAL | 2 refills | Status: AC

## 2018-01-02 MED ORDER — SILDENAFIL CITRATE 100 MG PO TABS: 50.0000 mg | ORAL_TABLET | Freq: Every day | ORAL | 11 refills | Status: DC | PRN

## 2018-01-02 MED ORDER — CEFUROXIME AXETIL 250 MG PO TABS: 250.0000 mg | ORAL_TABLET | Freq: Two times a day (BID) | ORAL | 2 refills | Status: DC

## 2018-01-02 MED ORDER — ALPRAZOLAM 1 MG PO TABS: 1.0000 mg | ORAL_TABLET | Freq: Two times a day (BID) | ORAL | 0 refills | Status: DC | PRN

## 2018-01-02 NOTE — Patient Instructions (Addendum)
Please continue all other medications as before, and refills have been done if requested, except for the Coumadin that usually is done per the [facility]in [facility]  Please have the pharmacy call with any other refills you may need.  Please continue your efforts at being more active, low cholesterol diet, and weight control.  Please keep your appointments with your specialists as you may have planned  Please go to the LAB in the Basement (turn left off the elevator) for the tests to be done today  You will be contacted by phone if any changes need to be made immediately.  Otherwise, you will receive a letter about your results with an explanation, but please check with MyChart first.  Please remember to sign up for MyChart if you have not done so, as this will be important to you in the future with finding out test results, communicating by private email, and scheduling acute appointments online when needed.
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
## 2018-01-02 NOTE — Assessment & Plan Note (Signed)
Likely related to ETOH, for f/u lab today

## 2018-01-02 NOTE — Assessment & Plan Note (Signed)
Resolved per pt restarted on coumadin, for f/u cbc today

## 2018-01-02 NOTE — Assessment & Plan Note (Signed)
For f/u lab today 

## 2018-01-02 NOTE — Addendum Note (Signed)
Addended by: Corwin LevinsJOHN, Gloriana Piltz W on: 01/02/2018 10:51 AM   Modules accepted: Orders

## 2018-01-02 NOTE — Assessment & Plan Note (Signed)
Urged to further moderate or abstain

## 2018-01-02 NOTE — Assessment & Plan Note (Signed)
Also thought related to ETOH, for f/u today

## 2018-03-01 ENCOUNTER — Other Ambulatory Visit: Payer: Self-pay | Admitting: Internal Medicine

## 2018-03-01 NOTE — Telephone Encounter (Signed)
Done erx 

## 2018-03-01 NOTE — Telephone Encounter (Signed)
   LOV:01/02/18 NextOV:not scheduled Last Filled/Quantity: 02/01/18 120#

## 2018-03-30 ENCOUNTER — Other Ambulatory Visit: Payer: Self-pay | Admitting: Internal Medicine

## 2018-03-30 NOTE — Telephone Encounter (Signed)
Requested medication (s) are due for refill today: Yes  Requested medication (s) are on the active medication list: Yes  Last refill:  03/01/18  Future visit scheduled: No  Notes to clinic:  Patient requesting 120 tabs.     Requested Prescriptions  Pending Prescriptions Disp Refills   ALPRAZolam (XANAX) 1 MG tablet 60 tablet 0    Sig: Take 1 tablet (1 mg total) by mouth 2 (two) times daily as needed.     Not Delegated - Psychiatry:  Anxiolytics/Hypnotics Failed - 03/30/2018  4:40 PM      Failed - This refill cannot be delegated      Passed - Urine Drug Screen completed in last 360 days.      Passed - Valid encounter within last 6 months    Recent Outpatient Visits          2 months ago Hematemesis with nausea   [facility] -Clair Gulling, MD   7 months ago Preventative health care   [facility] -Clair Gulling, MD   1 year ago Ulcer of toe of right foot, unspecified ulcer stage [facility])   [facility] -Clair Gulling, MD   1 year ago ELEVATED BLOOD PRESSURE WITHOUT DIAGNOSIS OF HYPERTENSION   [facility] -Clair Gulling, MD   1 year ago Anxiety state   [facility] -Clair Gulling, MD

## 2018-03-30 NOTE — Telephone Encounter (Signed)
Copied from CRM [id number]. Topic: Quick Communication - See Telephone Encounter >> Mar 30, 2018  4:34 PM Terisa Starr wrote: CRM for notification. See Telephone encounter for: 03/30/18.  ALPRAZolam (XANAX) 1 MG tablet, patient is requesting 120 at a time.   [facility] [phone] - [place], [place] - [phone] AT [facility] [address] & [address] [place] [address]

## 2018-03-31 MED ORDER — ALPRAZOLAM 1 MG PO TABS: 1.0000 mg | ORAL_TABLET | Freq: Two times a day (BID) | ORAL | 0 refills | Status: DC | PRN

## 2018-03-31 NOTE — Telephone Encounter (Signed)
   LOV: 01/02/18 NextOV: not scheduled Last Filled/Quantity: 03/01/18 60#

## 2018-05-08 ENCOUNTER — Other Ambulatory Visit: Payer: Self-pay | Admitting: Internal Medicine

## 2018-05-08 NOTE — Telephone Encounter (Signed)
Copied from CRM [id number]. Topic: Quick Communication - Rx Refill/Question >> May 08, 2018  3:20 PM Gerrianne Scale wrote: Medication: ALPRAZolam Prudy Feeler) 1 MG tablet  Has the patient contacted their pharmacy? Yes.   (Agent: If no, request that the patient contact the pharmacy for the refill.) (Agent: If yes, when and what did the pharmacy advise?) the computer told him that it was going to be ready today  Preferred Pharmacy (with phone number or street name): [facility] [phone] - [place], [place] - [address] AT [facility] OF [address] & [address] [phone] (Phone) [phone] (Fax)    Agent: Please be advised that RX refills may take up to 3 business days. We ask that you follow-up with your pharmacy.

## 2018-05-09 MED ORDER — ALPRAZOLAM 1 MG PO TABS: 1.0000 mg | ORAL_TABLET | Freq: Two times a day (BID) | ORAL | 2 refills | Status: DC | PRN

## 2018-05-09 NOTE — Telephone Encounter (Signed)
MD approved and sent electronically to pof../lmb  

## 2018-05-09 NOTE — Telephone Encounter (Signed)
Requested medication (s) are due for refill today: yes  Requested medication (s) are on the active medication list: yes  Last refill:03/31/18  Future visit scheduled: no  Notes to clinic:  Not delegated    Requested Prescriptions  Pending Prescriptions Disp Refills   ALPRAZolam (XANAX) 1 MG tablet 60 tablet 0    Sig: Take 1 tablet (1 mg total) by mouth 2 (two) times daily as needed.     Not Delegated - Psychiatry:  Anxiolytics/Hypnotics Failed - 05/08/2018  3:52 PM      Failed - This refill cannot be delegated      Passed - Urine Drug Screen completed in last 360 days.      Passed - Valid encounter within last 6 months    Recent Outpatient Visits          4 months ago Hematemesis with nausea   [facility] -Clair GullingElam John, James W, MD   8 months ago Preventative health care   [facility] -Clair Gulling[facility]lam John, James W, MD   1 year ago Ulcer of toe of right foot, unspecified ulcer stage [facility]   [facility] -Clair GullingElam John, James W, MD   1 year ago ELEVATED BLOOD PRESSURE WITHOUT DIAGNOSIS OF HYPERTENSION   [facility] -Clair GullingElam John, James W, MD   1 year ago Anxiety state   [facility] -[facility] John, James W, MD

## 2018-05-09 NOTE — Telephone Encounter (Signed)
Done erx 

## 2018-05-12 ENCOUNTER — Other Ambulatory Visit: Payer: Self-pay | Admitting: Internal Medicine

## 2018-06-05 ENCOUNTER — Other Ambulatory Visit: Payer: Self-pay | Admitting: Internal Medicine

## 2018-06-05 NOTE — Telephone Encounter (Signed)
Done erx 

## 2018-06-16 ENCOUNTER — Ambulatory Visit (INDEPENDENT_AMBULATORY_CARE_PROVIDER_SITE_OTHER): Payer: Managed Care, Other (non HMO) | Admitting: General Practice

## 2018-06-16 ENCOUNTER — Other Ambulatory Visit: Payer: Self-pay | Admitting: General Practice

## 2018-06-16 DIAGNOSIS — Z5181 Encounter for therapeutic drug level monitoring: Secondary | ICD-10-CM | POA: Diagnosis not present

## 2018-06-16 LAB — POCT INR: INR: 1.3 — AB (ref 2.0–3.0)

## 2018-06-16 MED ORDER — WARFARIN SODIUM 5 MG PO TABS: ORAL_TABLET | ORAL | 1 refills | Status: DC

## 2018-06-16 NOTE — Patient Instructions (Addendum)
Pre visit review using our clinic review tool, if applicable. No additional management support is needed unless otherwise documented below in the visit note.  Take 1 1/2 tablets today (12/20) and take 2 tablets on Saturday and Sunday.  On Monday continue to take 1 1/2 tablets everyday except 1 tablet on Monday and Friday. Re-check in 1 to 2 weeks.

## 2018-06-30 ENCOUNTER — Ambulatory Visit: Payer: Managed Care, Other (non HMO)

## 2018-07-20 ENCOUNTER — Encounter: Payer: Self-pay | Admitting: Internal Medicine

## 2018-07-20 ENCOUNTER — Ambulatory Visit: Payer: Managed Care, Other (non HMO) | Admitting: Internal Medicine

## 2018-07-20 VITALS — BP 182/110 | HR 94 | Temp 98.7°F | Ht 78.0 in

## 2018-07-20 DIAGNOSIS — R6889 Other general symptoms and signs: Secondary | ICD-10-CM | POA: Diagnosis not present

## 2018-07-20 DIAGNOSIS — R030 Elevated blood-pressure reading, without diagnosis of hypertension: Secondary | ICD-10-CM

## 2018-07-20 DIAGNOSIS — J069 Acute upper respiratory infection, unspecified: Secondary | ICD-10-CM | POA: Diagnosis not present

## 2018-07-20 DIAGNOSIS — J309 Allergic rhinitis, unspecified: Secondary | ICD-10-CM | POA: Diagnosis not present

## 2018-07-20 DIAGNOSIS — Z23 Encounter for immunization: Secondary | ICD-10-CM

## 2018-07-20 LAB — POCT INFLUENZA A/B
INFLUENZA A, POC: NEGATIVE
Influenza B, POC: NEGATIVE

## 2018-07-20 MED ORDER — AZITHROMYCIN 250 MG PO TABS: ORAL_TABLET | ORAL | 1 refills | Status: DC

## 2018-07-20 MED ORDER — HYDROCODONE-HOMATROPINE 5-1.5 MG/5ML PO SYRP: 5.0000 mL | ORAL_SOLUTION | Freq: Four times a day (QID) | ORAL | 0 refills | Status: AC | PRN

## 2018-07-20 NOTE — Assessment & Plan Note (Signed)
BP Readings from Last 3 Encounters:  07/20/18 (!) 182/110  01/02/18 132/88  11/23/17 132/85   Moderately elevated today, likely situational, cont to monitor at home and next visit

## 2018-07-20 NOTE — Patient Instructions (Signed)
Your Flu test was negative  Please take all new medication as prescribed - the antibiotic, and cough medicine as needed  Please continue all other medications as before, and refills have been done if requested.  Please have the pharmacy call with any other refills you may need.  Please continue your efforts at being more active, low cholesterol diet, and weight control.  Please keep your appointments with your specialists as you may have planned
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
## 2018-07-20 NOTE — Assessment & Plan Note (Signed)
Mild to mod, for antibx course,  to f/u any worsening symptoms or concerns 

## 2018-07-20 NOTE — Assessment & Plan Note (Signed)
stable overall by history and exam, recent data reviewed with pt, and pt to continue medical treatment as before,  to f/u any worsening symptoms or concerns  

## 2018-08-29 ENCOUNTER — Other Ambulatory Visit: Payer: Self-pay

## 2018-08-29 ENCOUNTER — Other Ambulatory Visit (INDEPENDENT_AMBULATORY_CARE_PROVIDER_SITE_OTHER): Payer: Managed Care, Other (non HMO)

## 2018-08-29 ENCOUNTER — Ambulatory Visit: Payer: Self-pay | Admitting: *Deleted

## 2018-08-29 ENCOUNTER — Encounter: Payer: Self-pay | Admitting: Internal Medicine

## 2018-08-29 ENCOUNTER — Ambulatory Visit (INDEPENDENT_AMBULATORY_CARE_PROVIDER_SITE_OTHER): Payer: Managed Care, Other (non HMO) | Admitting: General Practice

## 2018-08-29 ENCOUNTER — Ambulatory Visit: Payer: Managed Care, Other (non HMO) | Admitting: Internal Medicine

## 2018-08-29 VITALS — BP 160/104 | HR 106 | Temp 97.9°F | Ht 78.0 in

## 2018-08-29 DIAGNOSIS — M7989 Other specified soft tissue disorders: Secondary | ICD-10-CM

## 2018-08-29 DIAGNOSIS — F1010 Alcohol abuse, uncomplicated: Secondary | ICD-10-CM | POA: Diagnosis not present

## 2018-08-29 DIAGNOSIS — Z5181 Encounter for therapeutic drug level monitoring: Secondary | ICD-10-CM

## 2018-08-29 LAB — CBC
HEMATOCRIT: 48.7 % (ref 39.0–52.0)
HEMOGLOBIN: 16.4 g/dL (ref 13.0–17.0)
MCHC: 33.7 g/dL (ref 30.0–36.0)
MCV: 93.2 fl (ref 78.0–100.0)
PLATELETS: 405 10*3/uL — AB (ref 150.0–400.0)
RBC: 5.22 Mil/uL (ref 4.22–5.81)
RDW: 16 % — ABNORMAL HIGH (ref 11.5–15.5)
WBC: 4.4 10*3/uL (ref 4.0–10.5)

## 2018-08-29 LAB — COMPREHENSIVE METABOLIC PANEL
ALK PHOS: 159 U/L — AB (ref 39–117)
ALT: 31 U/L (ref 0–53)
AST: 49 U/L — ABNORMAL HIGH (ref 0–37)
Albumin: 4.4 g/dL (ref 3.5–5.2)
BUN: 8 mg/dL (ref 6–23)
CO2: 26 mEq/L (ref 19–32)
Calcium: 9.6 mg/dL (ref 8.4–10.5)
Chloride: 100 mEq/L (ref 96–112)
Creatinine, Ser: 0.79 mg/dL (ref 0.40–1.50)
GFR: 131.64 mL/min (ref >60.00)
Glucose, Bld: 88 mg/dL (ref 70–99)
POTASSIUM: 4.1 meq/L (ref 3.5–5.1)
SODIUM: 139 meq/L (ref 135–145)
TOTAL PROTEIN: 8.5 g/dL — AB (ref 6.0–8.3)
Total Bilirubin: 0.9 mg/dL (ref 0.2–1.2)

## 2018-08-29 LAB — POCT INR: INR: 2.4 (ref 2.0–3.0)

## 2018-08-29 LAB — VITAMIN B12: Vitamin B-12: 230 pg/mL (ref 211–911)

## 2018-08-29 MED ORDER — ALPRAZOLAM 1 MG PO TABS: 1.0000 mg | ORAL_TABLET | Freq: Two times a day (BID) | ORAL | 2 refills | Status: DC | PRN

## 2018-08-29 NOTE — Telephone Encounter (Signed)
Patient requesting a refill during visit

## 2018-08-29 NOTE — Patient Instructions (Signed)
Pre visit review using our clinic review tool, if applicable. No additional management support is needed unless otherwise documented below in the visit note.  Continue to take 1 1/2 tablets everyday except 1 tablet on Monday and Friday. Re-check in 1 to 2 weeks.

## 2018-08-29 NOTE — Patient Instructions (Addendum)
We are checking labs today and you will get the ultrasound tomorrow at [facility].   Keep taking the coumadin per Cindy's advice.   You need to stop drinking alcohol at all and take your medicine daily. This can save your life!  Blood clots can kill you if they travel to the lungs.   Come back in 1-2 weeks with Dr. Jonny Ruiz

## 2018-08-29 NOTE — Assessment & Plan Note (Signed)
Counseled to stop as this is dangerous while on blood thinners and is causing him to forget to take his medications.

## 2018-08-29 NOTE — Assessment & Plan Note (Signed)
Concerning for DVT with history and recent admission of not taking as he should. INR 2.4 during visit so will continue with coumadin. Did spend an extensive amount of time counseling about xarelto and risks and benefits. He does not want to make change today. Ordered stat LE doppler to rule out DVT. No signs of symptoms of PE. Advised to follow up with PCP and coumadin dosing discussed with patient through concurrent coumadin clinic visit.

## 2018-08-29 NOTE — Telephone Encounter (Signed)
Patient reports he has had a blood clot before and is presently on Warfarin. Patient is having swelling in left leg now. Patient reports he is having new swelling that is not going away with elevation. Patient is concerned due to his history of DVT.   Reason for Disposition . [1] Thigh, calf, or ankle swelling AND [2] only 1 side  Answer Assessment - Initial Assessment Questions 1. ONSET: "When did the swelling start?" (e.g., minutes, hours, days)     Swelling started last week 2. LOCATION: "What part of the leg is swollen?"  "Are both legs swollen or just one leg?"     feet up to calve-just in left leg  3. SEVERITY: "How bad is the swelling?" (e.g., localized; mild, moderate, severe)  - Localized - small area of swelling localized to one leg  - MILD pedal edema - swelling limited to foot and ankle, pitting edema < 1/4 inch (6 mm) deep, rest and elevation eliminate most or all swelling  - MODERATE edema - swelling of lower leg to knee, pitting edema > 1/4 inch (6 mm) deep, rest and elevation only partially reduce swelling  - SEVERE edema - swelling extends above knee, facial or hand swelling present      Moderate- when patient lays down the swelling goes away 4. REDNESS: "Does the swelling look red or infected?"     Looks red at times- at ankle and feet 5. PAIN: "Is the swelling painful to touch?" If so, ask: "How painful is it?"   (Scale 1-10; mild, moderate or severe)     No pian 6. FEVER: "Do you have a fever?" If so, ask: "What is it, how was it measured, and when did it start?"      No- not checked 7. CAUSE: "What do you think is causing the leg swelling?"     History of blood clot 8. MEDICAL HISTORY: "Do you have a history of heart failure, kidney disease, liver failure, or cancer?"     no 9. RECURRENT SYMPTOM: "Have you had leg swelling before?" If so, ask: "When was the last time?" "What happened that time?"     Some leg swelling- usually goes down fairly quickly 10. OTHER  SYMPTOMS: "Do you have any other symptoms?" (e.g., chest pain, difficulty breathing)       no 11. PREGNANCY: "Is there any chance you are pregnant?" "When was your last menstrual period?"       n/a  Protocols used: LEG SWELLING AND EDEMA-A-AH

## 2018-08-29 NOTE — Telephone Encounter (Signed)
Done erx 
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
## 2018-08-30 ENCOUNTER — Other Ambulatory Visit: Payer: Self-pay

## 2018-08-30 ENCOUNTER — Telehealth: Payer: Self-pay | Admitting: Internal Medicine

## 2018-08-30 ENCOUNTER — Emergency Department (HOSPITAL_COMMUNITY)
Admission: EM | Admit: 2018-08-30 | Discharge: 2018-08-30 | Discharge status: Against Medical Advice | Payer: Managed Care, Other (non HMO) | Attending: Emergency Medicine | Admitting: Emergency Medicine

## 2018-08-30 ENCOUNTER — Encounter (HOSPITAL_COMMUNITY): Payer: Self-pay | Admitting: *Deleted

## 2018-08-30 ENCOUNTER — Emergency Department (HOSPITAL_BASED_OUTPATIENT_CLINIC_OR_DEPARTMENT_OTHER): Payer: Managed Care, Other (non HMO)

## 2018-08-30 ENCOUNTER — Ambulatory Visit (HOSPITAL_COMMUNITY): Payer: Managed Care, Other (non HMO)

## 2018-08-30 DIAGNOSIS — L03116 Cellulitis of left lower limb: Secondary | ICD-10-CM | POA: Diagnosis not present

## 2018-08-30 DIAGNOSIS — F1210 Cannabis abuse, uncomplicated: Secondary | ICD-10-CM | POA: Insufficient documentation

## 2018-08-30 DIAGNOSIS — M7989 Other specified soft tissue disorders: Secondary | ICD-10-CM | POA: Diagnosis not present

## 2018-08-30 DIAGNOSIS — Z5320 Procedure and treatment not carried out because of patient's decision for unspecified reasons: Secondary | ICD-10-CM | POA: Diagnosis not present

## 2018-08-30 DIAGNOSIS — R2242 Localized swelling, mass and lump, left lower limb: Secondary | ICD-10-CM | POA: Diagnosis present

## 2018-08-30 DIAGNOSIS — M79605 Pain in left leg: Secondary | ICD-10-CM | POA: Diagnosis not present

## 2018-08-30 MED ORDER — SULFAMETHOXAZOLE-TRIMETHOPRIM 800-160 MG PO TABS: 1.0000 | ORAL_TABLET | Freq: Two times a day (BID) | ORAL | 0 refills | Status: AC

## 2018-08-30 MED ORDER — CLINDAMYCIN PHOSPHATE 600 MG/50ML IV SOLN: 600.0000 mg | Freq: Once | INTRAVENOUS | Status: DC

## 2018-08-30 MED ORDER — CEPHALEXIN 500 MG PO CAPS: 500.0000 mg | ORAL_CAPSULE | Freq: Four times a day (QID) | ORAL | 0 refills | Status: DC

## 2018-08-30 MED ORDER — SODIUM CHLORIDE 0.9 % IV BOLUS: 1000.0000 mL | Freq: Once | INTRAVENOUS | Status: DC

## 2018-08-30 NOTE — Telephone Encounter (Signed)
Taken care of under results

## 2018-08-30 NOTE — ED Notes (Signed)
Bed: [facility]3 Expected date:  Expected time:  Means of arrival:  Comments: 

## 2018-08-30 NOTE — ED Triage Notes (Signed)
Pt reports noticing LE swelling a week ago, he was scheduled today for an outpt Vascular study at 0900, he missed it, and rescheduled for 1600 today.  His family reports pt missed the 1600 appt because they were provided the wrong address to the bldg.  Pt wants to be seen in the ED to have the study emergently.  He has hx of DVT in the past.  He was advised to call tomorrow to re-schedule but he declined, because he did not want to clot to travel to his lungs and die and he "has things to do."  Pt slurs his words when talking.
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
## 2018-08-30 NOTE — Discharge Instructions (Addendum)
It was my pleasure taking care of you today!   Please take all of your antibiotics until finished!  It is very important that you get your leg rechecked in 2 days to ensure that it is getting better.  Call your doctor tomorrow to schedule this.  If you cannot get an appointment, I would encourage you to return to the ER for a recheck.  Return to the ER immediately for fever, worsening symptoms or any additional concerns.

## 2018-08-30 NOTE — Telephone Encounter (Signed)
Provided results  Per Dr. Hillard Danker on  08/30/18 .  Patient  Voiced  Understanding.  Patient  States that  He has  Rescheduled ultrasound  Appointment   To  This  Afternoon.

## 2018-08-30 NOTE — Progress Notes (Signed)
Lower extremity venous has been completed.   Preliminary results in CV Proc.   Blanch Media 08/30/2018 6:59 PM

## 2018-09-08 ENCOUNTER — Other Ambulatory Visit: Payer: Self-pay | Admitting: Internal Medicine

## 2018-09-08 MED ORDER — WARFARIN SODIUM 5 MG PO TABS: ORAL_TABLET | ORAL | 1 refills | Status: DC

## 2018-09-08 NOTE — Telephone Encounter (Signed)
Copied from CRM [id number]. Topic: Quick Communication - Rx Refill/Question >> Sep 08, 2018 12:50 PM Donita Brooks wrote: Medication: warfarin (COUMADIN) 5 MG tablet   Has the patient contacted their pharmacy? Yes.    (Agent: If yes, when and what did the pharmacy advise?)Pharmacy has tried to contact office.   Preferred Pharmacy (with phone number or street name): [facility] [phone] - [place], [place] - [address] AT [facility] [address] [phone] (Phone) [phone] (Fax)    Agent: Please be advised that RX refills may take up to 3 business days. We ask that you follow-up with your pharmacy.

## 2018-09-08 NOTE — Telephone Encounter (Signed)
Requested medication (s) are due for refill today -yes    Requested medication (s) are on the active medication list -yes  Future visit scheduled -yes  Last refill: 06/16/18  Notes to clinic: Patient is requesting refill of non delegated medication. Sent for review by coumadin clinic  Requested Prescriptions  Pending Prescriptions Disp Refills   warfarin (COUMADIN) 5 MG tablet 40 tablet 1    Sig: Take 1 1/2 tablets daily except 1 tablet on Monday and Friday or AS DIRECTED BY ANTICOAGULATION CLINIC     Hematology:  Anticoagulants - warfarin Failed - 09/08/2018 12:56 PM      Failed - This refill cannot be delegated      Failed - If the patient is managed by Coumadin Clinic - route to their Pool. If not, forward to the provider.      Passed - INR in normal range and within 30 days    POC INR  Date Value Ref Range Status  09/02/2010 3.6     INR  Date Value Ref Range Status  08/29/2018 2.4 2.0 - 3.0 Final  11/23/2017 2.44  Final    Comment:    Performed at [facility], [address] [place], [place] [address]         Passed - Valid encounter within last 3 months    Recent Outpatient Visits          1 week ago Left leg swelling   [facility] -Willis Modena, MD   1 month ago Acute upper respiratory infection   [facility] -Clair Gulling, MD   8 months ago Hematemesis with nausea   [facility] -Clair Gulling, MD   1 year ago Preventative health care   [facility] -Clair Gulling, MD   1 year ago Ulcer of toe of right foot, unspecified ulcer stage [facility])   [facility] -Clair Gulling, MD              Requested Prescriptions  Pending Prescriptions Disp Refills   warfarin (COUMADIN) 5 MG tablet 40 tablet 1    Sig: Take 1 1/2 tablets daily except 1 tablet on Monday and Friday or AS DIRECTED BY ANTICOAGULATION CLINIC     Hematology:  Anticoagulants - warfarin Failed - 09/08/2018 12:56 PM      Failed - This refill cannot be delegated      Failed - If the patient is managed by Coumadin Clinic - route to their Pool. If not, forward to the provider.      Passed - INR in normal range and within 30 days    POC INR  Date Value Ref Range Status  09/02/2010 3.6     INR  Date Value Ref Range Status  08/29/2018 2.4 2.0 - 3.0 Final  11/23/2017 2.44  Final    Comment:    Performed at [facility], [address], [place], [place] [address]         Passed - Valid encounter within last 3 months    Recent Outpatient Visits          1 week ago Left leg swelling   [facility] -Willis Modena, MD   1 month ago Acute upper respiratory infection   [facility] -Clair Gulling, MD   8 months ago Hematemesis with nausea     [facility]   1 year ago Preventative health care   [facility] -Clair Gulling, MD   1 year ago Ulcer of toe of right foot, unspecified ulcer stage [facility])   [facility] -Clair Gulling, MD

## 2018-09-12 ENCOUNTER — Ambulatory Visit: Payer: Managed Care, Other (non HMO)

## 2018-09-30 ENCOUNTER — Other Ambulatory Visit: Payer: Self-pay | Admitting: Internal Medicine

## 2018-12-09 ENCOUNTER — Other Ambulatory Visit: Payer: Self-pay | Admitting: Internal Medicine

## 2018-12-11 NOTE — Telephone Encounter (Signed)
[place] Controlled Database Checked Last filled: 12/02/18 # 60 LOV w/you: 07/20/18 Next appt w/you: None

## 2019-01-05 ENCOUNTER — Other Ambulatory Visit: Payer: Self-pay | Admitting: Internal Medicine

## 2019-03-06 ENCOUNTER — Emergency Department (HOSPITAL_COMMUNITY): Payer: Managed Care, Other (non HMO)

## 2019-03-06 ENCOUNTER — Other Ambulatory Visit: Payer: Self-pay

## 2019-03-06 ENCOUNTER — Encounter (HOSPITAL_COMMUNITY): Payer: Self-pay

## 2019-03-06 ENCOUNTER — Emergency Department (HOSPITAL_COMMUNITY)
Admission: EM | Admit: 2019-03-06 | Discharge: 2019-03-06 | Disposition: A | Discharge status: Home / Self Care | Payer: Managed Care, Other (non HMO) | Attending: Emergency Medicine | Admitting: Emergency Medicine

## 2019-03-06 DIAGNOSIS — R109 Unspecified abdominal pain: Secondary | ICD-10-CM | POA: Insufficient documentation

## 2019-03-06 DIAGNOSIS — Z8669 Personal history of other diseases of the nervous system and sense organs: Secondary | ICD-10-CM | POA: Diagnosis not present

## 2019-03-06 DIAGNOSIS — R34 Anuria and oliguria: Secondary | ICD-10-CM | POA: Insufficient documentation

## 2019-03-06 LAB — CBC WITH DIFFERENTIAL/PLATELET
Abs Immature Granulocytes: 0.02 10*3/uL (ref 0.00–0.07)
Basophils Absolute: 0 10*3/uL (ref 0.0–0.1)
Basophils Relative: 1 %
Eosinophils Absolute: 0.2 10*3/uL (ref 0.0–0.5)
Eosinophils Relative: 3 %
HCT: 45.8 % (ref 39.0–52.0)
Hemoglobin: 15.3 g/dL (ref 13.0–17.0)
Immature Granulocytes: 0 %
Lymphocytes Relative: 21 %
Lymphs Abs: 1.3 10*3/uL (ref 0.7–4.0)
MCH: 30.5 pg (ref 26.0–34.0)
MCHC: 33.4 g/dL (ref 30.0–36.0)
MCV: 91.4 fL (ref 80.0–100.0)
Monocytes Absolute: 0.9 10*3/uL (ref 0.1–1.0)
Monocytes Relative: 15 %
Neutro Abs: 3.7 10*3/uL (ref 1.7–7.7)
Neutrophils Relative %: 60 %
Platelets: 215 10*3/uL (ref 150–400)
RBC: 5.01 MIL/uL (ref 4.22–5.81)
RDW: 14.4 % (ref 11.5–15.5)
WBC: 6.1 10*3/uL (ref 4.0–10.5)
nRBC: 0 % (ref 0.0–0.2)

## 2019-03-06 LAB — URINALYSIS, ROUTINE W REFLEX MICROSCOPIC
Bacteria, UA: NONE SEEN
Glucose, UA: NEGATIVE mg/dL
Hgb urine dipstick: NEGATIVE
Ketones, ur: 80 mg/dL — AB
Nitrite: NEGATIVE
Protein, ur: 100 mg/dL — AB
Specific Gravity, Urine: 1.036 — ABNORMAL HIGH (ref 1.005–1.030)
pH: 5 (ref 5.0–8.0)

## 2019-03-06 LAB — BASIC METABOLIC PANEL
Anion gap: 14 (ref 5–15)
BUN: 12 mg/dL (ref 6–20)
CO2: 26 mmol/L (ref 22–32)
Calcium: 9.6 mg/dL (ref 8.9–10.3)
Chloride: 94 mmol/L — ABNORMAL LOW (ref 98–111)
Creatinine, Ser: 0.83 mg/dL (ref 0.61–1.24)
GFR calc Af Amer: >60 mL/min (ref >60)
GFR calc non Af Amer: >60 mL/min (ref >60)
Glucose, Bld: 91 mg/dL (ref 70–99)
Potassium: 2.9 mmol/L — ABNORMAL LOW (ref 3.5–5.1)
Sodium: 134 mmol/L — ABNORMAL LOW (ref 135–145)

## 2019-03-06 LAB — CK: Total CK: 867 U/L — ABNORMAL HIGH (ref 49–397)

## 2019-03-06 MED ORDER — SODIUM CHLORIDE 0.9 % IV BOLUS
1000.0000 mL | Freq: Once | INTRAVENOUS | Status: AC
  Administered 2019-03-06: 1000 mL via INTRAVENOUS

## 2019-03-06 NOTE — ED Triage Notes (Signed)
Patient c/o bilateral abdominal pain and flank pain x 1 week. Patient states he really didn't p[ay attention to the pain until a few days ago. Patient also c/o urinary retention.

## 2019-03-06 NOTE — ED Notes (Signed)
Pt verbalized discharge instructions and follow up care. Alert and uses wheelchair. No IV. No further questions at this time.

## 2019-03-06 NOTE — Discharge Instructions (Addendum)
Continue self caths as before.  Return to the emergency department if you experience high fever, severe abdominal pain, or other new and concerning symptoms.  Follow-up with your primary doctor in the next week.

## 2019-03-06 NOTE — ED Notes (Signed)
Urine and culture sent to lab  
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
## 2019-03-07 ENCOUNTER — Ambulatory Visit (INDEPENDENT_AMBULATORY_CARE_PROVIDER_SITE_OTHER): Payer: Managed Care, Other (non HMO) | Admitting: Internal Medicine

## 2019-03-07 ENCOUNTER — Other Ambulatory Visit: Payer: Self-pay

## 2019-03-07 ENCOUNTER — Encounter: Payer: Self-pay | Admitting: Internal Medicine

## 2019-03-07 VITALS — BP 144/98 | HR 92 | Temp 97.6°F | Ht 78.0 in

## 2019-03-07 DIAGNOSIS — F411 Generalized anxiety disorder: Secondary | ICD-10-CM

## 2019-03-07 DIAGNOSIS — Z23 Encounter for immunization: Secondary | ICD-10-CM

## 2019-03-07 DIAGNOSIS — M545 Low back pain, unspecified: Secondary | ICD-10-CM

## 2019-03-07 DIAGNOSIS — G8929 Other chronic pain: Secondary | ICD-10-CM

## 2019-03-07 DIAGNOSIS — F329 Major depressive disorder, single episode, unspecified: Secondary | ICD-10-CM

## 2019-03-07 DIAGNOSIS — N390 Urinary tract infection, site not specified: Secondary | ICD-10-CM

## 2019-03-07 DIAGNOSIS — G43809 Other migraine, not intractable, without status migrainosus: Secondary | ICD-10-CM

## 2019-03-07 DIAGNOSIS — F32A Depression, unspecified: Secondary | ICD-10-CM

## 2019-03-07 DIAGNOSIS — F528 Other sexual dysfunction not due to a substance or known physiological condition: Secondary | ICD-10-CM | POA: Diagnosis not present

## 2019-03-07 DIAGNOSIS — I10 Essential (primary) hypertension: Secondary | ICD-10-CM

## 2019-03-07 DIAGNOSIS — K219 Gastro-esophageal reflux disease without esophagitis: Secondary | ICD-10-CM

## 2019-03-07 MED ORDER — CYCLOBENZAPRINE HCL 5 MG PO TABS: 5.0000 mg | ORAL_TABLET | Freq: Three times a day (TID) | ORAL | 2 refills | Status: DC | PRN

## 2019-03-07 MED ORDER — CITALOPRAM HYDROBROMIDE 20 MG PO TABS: 20.0000 mg | ORAL_TABLET | Freq: Every day | ORAL | 3 refills | Status: DC

## 2019-03-07 MED ORDER — SUMATRIPTAN SUCCINATE 100 MG PO TABS: 100.0000 mg | ORAL_TABLET | ORAL | 5 refills | Status: DC | PRN

## 2019-03-07 MED ORDER — PANTOPRAZOLE SODIUM 40 MG PO TBEC: 40.0000 mg | DELAYED_RELEASE_TABLET | Freq: Every day | ORAL | 3 refills | Status: DC

## 2019-03-07 MED ORDER — AMLODIPINE BESYLATE 5 MG PO TABS: 5.0000 mg | ORAL_TABLET | Freq: Every day | ORAL | 3 refills | Status: DC

## 2019-03-07 MED ORDER — ALPRAZOLAM 1 MG PO TABS: ORAL_TABLET | ORAL | 2 refills | Status: DC

## 2019-03-07 MED ORDER — CEFUROXIME AXETIL 250 MG PO TABS: ORAL_TABLET | ORAL | 2 refills | Status: DC

## 2019-03-07 MED ORDER — CIPROFLOXACIN HCL 500 MG PO TABS: 500.0000 mg | ORAL_TABLET | Freq: Two times a day (BID) | ORAL | 2 refills | Status: AC

## 2019-03-07 MED ORDER — SILDENAFIL CITRATE 100 MG PO TABS: 50.0000 mg | ORAL_TABLET | Freq: Every day | ORAL | 11 refills | Status: DC | PRN

## 2019-03-07 NOTE — Patient Instructions (Addendum)
Please take all new medication as prescribed - the imitrex for migraine, flexeril (cyclobenzaprine) for muscle spasms as needed, and celexa 20 mg per day for depression  Please continue all other medications as before, and refills have been done if requested, inlcluding the xanax  Please have the pharmacy call with any other refills you may need.  Please continue your efforts at being more active, low cholesterol diet, and weight control.  Please keep your appointments with your specialists as you may have planned  Please return in 6 months, or sooner if needed
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
## 2019-03-10 ENCOUNTER — Encounter: Payer: Self-pay | Admitting: Internal Medicine

## 2019-03-10 DIAGNOSIS — G43909 Migraine, unspecified, not intractable, without status migrainosus: Secondary | ICD-10-CM | POA: Insufficient documentation

## 2019-03-10 DIAGNOSIS — I10 Essential (primary) hypertension: Secondary | ICD-10-CM

## 2019-03-10 DIAGNOSIS — M545 Low back pain, unspecified: Secondary | ICD-10-CM | POA: Insufficient documentation

## 2019-03-10 DIAGNOSIS — K219 Gastro-esophageal reflux disease without esophagitis: Secondary | ICD-10-CM | POA: Insufficient documentation

## 2019-03-10 DIAGNOSIS — F32A Depression, unspecified: Secondary | ICD-10-CM | POA: Insufficient documentation

## 2019-03-10 DIAGNOSIS — F329 Major depressive disorder, single episode, unspecified: Secondary | ICD-10-CM | POA: Insufficient documentation

## 2019-03-10 HISTORY — DX: Essential (primary) hypertension: I10

## 2019-03-10 NOTE — Assessment & Plan Note (Signed)
stable overall by history and exam, recent data reviewed with pt, and pt to continue medical treatment as before,  to f/u any worsening symptoms or concerns  

## 2019-03-10 NOTE — Assessment & Plan Note (Signed)
[place] for refill xanax prn,  to f/u any worsening symptoms or concerns

## 2019-03-10 NOTE — Assessment & Plan Note (Signed)
Stable, for prophylactic med refills

## 2019-03-10 NOTE — Assessment & Plan Note (Signed)
Stable, for med refill prn 

## 2019-03-10 NOTE — Assessment & Plan Note (Signed)
c/w prob msk, for flexeril prn,  to f/u any worsening symptoms or concerns

## 2019-03-10 NOTE — Assessment & Plan Note (Addendum)
Mild to mod, for celexa 20 qd, declines counseling referral,  to f/u any worsening symptoms or concerns  Note:  Total time for pt hx, exam, review of record with pt in the room, determination of diagnoses and plan for further eval and tx is > 40 min, with over 50% spent in coordination and counseling of patient including the differential dx, tx, further evaluation and other management of depression, anxiety, HTn, GERD, migraine, recurrent uti, and ED

## 2019-03-10 NOTE — Assessment & Plan Note (Signed)
Mild to mod, for imitrex prn,,  to f/u any worsening symptoms or concerns

## 2019-06-14 ENCOUNTER — Other Ambulatory Visit: Payer: Self-pay | Admitting: Internal Medicine

## 2019-06-14 MED ORDER — ALPRAZOLAM 1 MG PO TABS: ORAL_TABLET | ORAL | 2 refills | Status: DC

## 2019-06-14 NOTE — Telephone Encounter (Signed)
Medication Refill - Medication: ALPRAZolam Duanne Moron) 1 MG tablet    Preferred Pharmacy (with phone number or street name):  [facility] [phone] - [place], [place] [address] AT [address] Phone:  [phone]  Fax:  [phone]       Agent: Please be advised that RX refills may take up to 3 business days. We ask that you follow-up with your pharmacy.

## 2019-06-14 NOTE — Telephone Encounter (Signed)
Done erx 

## 2019-06-14 NOTE — Telephone Encounter (Signed)
Requested medication (s) are due for refill today : yes  Requested medication (s) are on the active medication list: yes  Last refill: 03/07/2019  Future visit scheduled: no  Notes to clinic:  refill cannot be delegated    Requested Prescriptions  Pending Prescriptions Disp Refills   ALPRAZolam (XANAX) 1 MG tablet 60 tablet 2    Sig: TAKE 1 TABLET(1 MG) BY MOUTH TWICE DAILY AS NEEDED FOR ANXIETY      Not Delegated - Psychiatry:  Anxiolytics/Hypnotics Failed - 06/14/2019 10:36 AM      Failed - This refill cannot be delegated      Failed - Urine Drug Screen completed in last 360 days.      Passed - Valid encounter within last 6 months    Recent Outpatient Visits           3 months ago Depression, unspecified depression type   [place] John, James W, MD   9 months ago Left leg swelling   [place] Crawford, Elizabeth A, MD   10 months ago Acute upper respiratory infection   [facility] -Georges Mouse, MD   1 year ago Hematemesis with nausea   [facility] -Georges Mouse, MD   1 year ago Preventative health care   [facility] -Georges Mouse, MD

## 2019-09-13 ENCOUNTER — Other Ambulatory Visit: Payer: Self-pay | Admitting: Internal Medicine

## 2019-09-13 NOTE — Telephone Encounter (Signed)
Done erx 

## 2019-10-08 ENCOUNTER — Other Ambulatory Visit: Payer: Self-pay | Admitting: Internal Medicine

## 2019-10-08 NOTE — Telephone Encounter (Signed)
Check [place] registry last filled 09/13/2019. MD is out of the office this week pls advise on refill../lm,b

## 2019-10-11 ENCOUNTER — Telehealth: Payer: Self-pay | Admitting: Internal Medicine

## 2019-10-11 MED ORDER — ALPRAZOLAM 1 MG PO TABS: ORAL_TABLET | ORAL | 2 refills | Status: DC

## 2019-10-11 NOTE — Telephone Encounter (Signed)
Per last request rx was denied by Dr. Yetta Barre. Will forward to MD desktop to review for approval whn he return. Pt has f/u appt schedule for 10/17/19 w/Dr. Jonny Ruiz.Marland KitchenRaechel Chute

## 2019-10-11 NOTE — Telephone Encounter (Signed)
    1.Medication Requested:ALPRAZolam (XANAX) 1 MG tablet  2. Pharmacy (Name, Street, [place]):[facility] [phone] - [place], [place] - [address]  3. On Med List: yes  4. Last Visit with PCP: 03/07/19  5. Next visit date with PCP: 10/17/19   Agent: Please be advised that RX refills may take up to 3 business days. We ask that you follow-up with your pharmacy.

## 2019-10-11 NOTE — Telephone Encounter (Signed)
Done erx 

## 2019-10-12 NOTE — Telephone Encounter (Signed)
Called pt no answer LMOM MD approved refill has been sent pof.Marland KitchenRaechel Chute

## 2019-10-17 ENCOUNTER — Ambulatory Visit: Payer: Managed Care, Other (non HMO) | Admitting: Internal Medicine

## 2019-10-22 ENCOUNTER — Ambulatory Visit: Payer: Managed Care, Other (non HMO) | Admitting: Internal Medicine

## 2019-11-29 ENCOUNTER — Ambulatory Visit: Payer: Self-pay | Admitting: General Practice

## 2020-02-05 ENCOUNTER — Ambulatory Visit: Payer: Managed Care, Other (non HMO) | Admitting: Internal Medicine

## 2020-02-05 ENCOUNTER — Other Ambulatory Visit: Payer: Self-pay

## 2020-02-05 ENCOUNTER — Encounter: Payer: Self-pay | Admitting: Internal Medicine

## 2020-02-05 VITALS — BP 160/90 | HR 97 | Temp 98.3°F | Ht 78.0 in

## 2020-02-05 DIAGNOSIS — K219 Gastro-esophageal reflux disease without esophagitis: Secondary | ICD-10-CM | POA: Diagnosis not present

## 2020-02-05 DIAGNOSIS — F32A Depression, unspecified: Secondary | ICD-10-CM

## 2020-02-05 DIAGNOSIS — Z7901 Long term (current) use of anticoagulants: Secondary | ICD-10-CM

## 2020-02-05 DIAGNOSIS — I10 Essential (primary) hypertension: Secondary | ICD-10-CM

## 2020-02-05 DIAGNOSIS — N390 Urinary tract infection, site not specified: Secondary | ICD-10-CM

## 2020-02-05 DIAGNOSIS — Z0000 Encounter for general adult medical examination without abnormal findings: Secondary | ICD-10-CM | POA: Diagnosis not present

## 2020-02-05 DIAGNOSIS — F329 Major depressive disorder, single episode, unspecified: Secondary | ICD-10-CM

## 2020-02-05 DIAGNOSIS — F411 Generalized anxiety disorder: Secondary | ICD-10-CM

## 2020-02-05 MED ORDER — CIPROFLOXACIN HCL 500 MG PO TABS: 500.0000 mg | ORAL_TABLET | Freq: Two times a day (BID) | ORAL | 5 refills | Status: AC

## 2020-02-05 MED ORDER — SILDENAFIL CITRATE 100 MG PO TABS: 50.0000 mg | ORAL_TABLET | Freq: Every day | ORAL | 11 refills | Status: DC | PRN

## 2020-02-05 MED ORDER — ALPRAZOLAM 1 MG PO TABS: ORAL_TABLET | ORAL | 5 refills | Status: DC

## 2020-02-05 MED ORDER — CEFUROXIME AXETIL 250 MG PO TABS: ORAL_TABLET | ORAL | 2 refills | Status: DC
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
## 2020-02-05 NOTE — Patient Instructions (Signed)
Please check your blood pressure on a regular basis at home if possible, and call to restart BP med if BP usually > 140/90  Please continue all other medications as before, and refills have been done if requested.  Please have the pharmacy call with any other refills you may need.  Please continue your efforts at being more active, low cholesterol diet, and weight control.  You are otherwise up to date with prevention measures today.  Please keep your appointments with your specialists as you may have planned  Please go to the LAB at the blood drawing area for the tests to be done  You will be contacted by phone if any changes need to be made immediately.  Otherwise, you will receive a letter about your results with an explanation, but please check with MyChart first.  Please remember to sign up for MyChart if you have not done so, as this will be important to you in the future with finding out test results, communicating by private email, and scheduling acute appointments online when needed.  Please make an Appointment to return in 6 months, or sooner if needed

## 2020-02-06 LAB — CBC WITH DIFFERENTIAL/PLATELET
Absolute Monocytes: 815 cells/uL (ref 200–950)
Basophils Absolute: 23 cells/uL (ref 0–200)
Basophils Relative: 0.4 %
Eosinophils Absolute: 325 cells/uL (ref 15–500)
Eosinophils Relative: 5.7 %
HCT: 41.5 % (ref 38.5–50.0)
Hemoglobin: 14.1 g/dL (ref 13.2–17.1)
Lymphs Abs: 1362 cells/uL (ref 850–3900)
MCH: 32 pg (ref 27.0–33.0)
MCHC: 34 g/dL (ref 32.0–36.0)
MCV: 94.3 fL (ref 80.0–100.0)
MPV: 10.1 fL (ref 7.5–12.5)
Monocytes Relative: 14.3 %
Neutro Abs: 3175 cells/uL (ref 1500–7800)
Neutrophils Relative %: 55.7 %
Platelets: 330 10*3/uL (ref 140–400)
RBC: 4.4 10*6/uL (ref 4.20–5.80)
RDW: 12.8 % (ref 11.0–15.0)
Total Lymphocyte: 23.9 %
WBC: 5.7 10*3/uL (ref 3.8–10.8)

## 2020-02-06 LAB — HEPATIC FUNCTION PANEL
AG Ratio: 1.1 (calc) (ref 1.0–2.5)
ALT: 55 U/L — ABNORMAL HIGH (ref 9–46)
AST: 57 U/L — ABNORMAL HIGH (ref 10–40)
Albumin: 4.2 g/dL (ref 3.6–5.1)
Alkaline phosphatase (APISO): 66 U/L (ref 36–130)
Bilirubin, Direct: 0.2 mg/dL (ref 0.0–0.2)
Globulin: 3.7 g/dL (calc) (ref 1.9–3.7)
Indirect Bilirubin: 0.8 mg/dL (calc) (ref 0.2–1.2)
Total Bilirubin: 1 mg/dL (ref 0.2–1.2)
Total Protein: 7.9 g/dL (ref 6.1–8.1)

## 2020-02-06 LAB — LIPID PANEL
Cholesterol: 211 mg/dL — ABNORMAL HIGH (ref <200)
HDL: 87 mg/dL (ref >=40)
LDL Cholesterol (Calc): 103 mg/dL (calc) — ABNORMAL HIGH
Non-HDL Cholesterol (Calc): 124 mg/dL (calc) (ref <130)
Total CHOL/HDL Ratio: 2.4 (calc) (ref <5.0)
Triglycerides: 111 mg/dL (ref <150)

## 2020-02-06 LAB — TSH: TSH: 2.57 mIU/L (ref 0.40–4.50)

## 2020-02-06 LAB — PSA: PSA: 1 ng/mL (ref <=4.0)

## 2020-02-07 ENCOUNTER — Encounter: Payer: Self-pay | Admitting: Internal Medicine

## 2020-02-09 ENCOUNTER — Encounter: Payer: Self-pay | Admitting: Internal Medicine

## 2020-02-09 NOTE — Assessment & Plan Note (Signed)
stable overall by history and exam, recent data reviewed with pt, and pt to continue medical treatment as before,  to f/u any worsening symptoms or concerns  

## 2020-02-09 NOTE — Assessment & Plan Note (Signed)
Has taken himself off coumadin, declines other DOAC for now, despite risk

## 2020-02-09 NOTE — Assessment & Plan Note (Signed)
For med refills

## 2020-02-09 NOTE — Assessment & Plan Note (Signed)

## 2020-02-09 NOTE — Assessment & Plan Note (Signed)
decliens increased med tx, to f/u any worsening symptoms or concerns

## 2020-03-10 ENCOUNTER — Other Ambulatory Visit: Payer: Self-pay

## 2020-03-10 ENCOUNTER — Encounter (HOSPITAL_COMMUNITY): Payer: Self-pay | Admitting: Obstetrics and Gynecology

## 2020-03-10 ENCOUNTER — Emergency Department (HOSPITAL_COMMUNITY)
Admission: EM | Admit: 2020-03-10 | Discharge: 2020-03-10 | Disposition: A | Discharge status: Home / Self Care | Payer: Managed Care, Other (non HMO) | Attending: Emergency Medicine | Admitting: Emergency Medicine

## 2020-03-10 ENCOUNTER — Emergency Department (HOSPITAL_COMMUNITY): Payer: Managed Care, Other (non HMO)

## 2020-03-10 ENCOUNTER — Telehealth: Payer: Self-pay | Admitting: Internal Medicine

## 2020-03-10 DIAGNOSIS — E876 Hypokalemia: Secondary | ICD-10-CM

## 2020-03-10 DIAGNOSIS — R531 Weakness: Secondary | ICD-10-CM | POA: Diagnosis present

## 2020-03-10 DIAGNOSIS — H532 Diplopia: Secondary | ICD-10-CM | POA: Diagnosis not present

## 2020-03-10 DIAGNOSIS — I10 Essential (primary) hypertension: Secondary | ICD-10-CM | POA: Insufficient documentation

## 2020-03-10 DIAGNOSIS — Z79899 Other long term (current) drug therapy: Secondary | ICD-10-CM | POA: Diagnosis not present

## 2020-03-10 HISTORY — DX: Accidental discharge from unspecified firearms or gun, initial encounter: W34.00XA

## 2020-03-10 LAB — URINALYSIS, ROUTINE W REFLEX MICROSCOPIC
Glucose, UA: NEGATIVE mg/dL
Hgb urine dipstick: NEGATIVE
Ketones, ur: 5 mg/dL — AB
Nitrite: NEGATIVE
Protein, ur: 100 mg/dL — AB
Specific Gravity, Urine: 1.034 — ABNORMAL HIGH (ref 1.005–1.030)
pH: 6 (ref 5.0–8.0)

## 2020-03-10 LAB — BASIC METABOLIC PANEL
Anion gap: 16 — ABNORMAL HIGH (ref 5–15)
BUN: 7 mg/dL (ref 6–20)
CO2: 26 mmol/L (ref 22–32)
Calcium: 10.1 mg/dL (ref 8.9–10.3)
Chloride: 96 mmol/L — ABNORMAL LOW (ref 98–111)
Creatinine, Ser: 0.8 mg/dL (ref 0.61–1.24)
GFR calc Af Amer: >60 mL/min (ref >60)
GFR calc non Af Amer: >60 mL/min (ref >60)
Glucose, Bld: 93 mg/dL (ref 70–99)
Potassium: 2.8 mmol/L — ABNORMAL LOW (ref 3.5–5.1)
Sodium: 138 mmol/L (ref 135–145)

## 2020-03-10 LAB — CBC
HCT: 46.3 % (ref 39.0–52.0)
Hemoglobin: 15.9 g/dL (ref 13.0–17.0)
MCH: 31.5 pg (ref 26.0–34.0)
MCHC: 34.3 g/dL (ref 30.0–36.0)
MCV: 91.9 fL (ref 80.0–100.0)
Platelets: 273 10*3/uL (ref 150–400)
RBC: 5.04 MIL/uL (ref 4.22–5.81)
RDW: 13.2 % (ref 11.5–15.5)
WBC: 4.3 10*3/uL (ref 4.0–10.5)
nRBC: 0 % (ref 0.0–0.2)

## 2020-03-10 LAB — CBG MONITORING, ED: Glucose-Capillary: 114 mg/dL — ABNORMAL HIGH (ref 70–99)

## 2020-03-10 LAB — MAGNESIUM: Magnesium: 1.6 mg/dL — ABNORMAL LOW (ref 1.7–2.4)

## 2020-03-10 MED ORDER — MAGNESIUM SULFATE IN D5W 1-5 GM/100ML-% IV SOLN
1.0000 g | Freq: Once | INTRAVENOUS | Status: AC
  Administered 2020-03-10: 1 g via INTRAVENOUS
  Filled 2020-03-10: qty 100

## 2020-03-10 MED ORDER — MAGNESIUM SULFATE 50 % IJ SOLN: 1.0000 g | Freq: Once | INTRAMUSCULAR | Status: DC

## 2020-03-10 MED ORDER — POTASSIUM CHLORIDE CRYS ER 20 MEQ PO TBCR
40.0000 meq | EXTENDED_RELEASE_TABLET | Freq: Once | ORAL | Status: AC
  Administered 2020-03-10: 40 meq via ORAL
  Filled 2020-03-10: qty 2

## 2020-03-10 NOTE — Discharge Instructions (Addendum)
Your potassium and magnesium were low.  Follow-up with your primary care provider tomorrow to ask about chronic routine screening and treatment

## 2020-03-10 NOTE — Telephone Encounter (Signed)
    Patient states he recently stopped drinking alcohol. Over the weekend he started to have left arm numbness. No other symptoms. Appointment 9/14  Call transferred to [facility]

## 2020-03-10 NOTE — ED Notes (Signed)
Patient reports he last was seen/felt normal on Saturday night. PA made aware
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
## 2020-03-10 NOTE — ED Triage Notes (Signed)
Patient presents to the ER for possible stroke like symptoms. Patient sent by PCP for evaluation of possible stroke. Patient reports decreased sensation on the left side/ MD made aware

## 2020-03-11 ENCOUNTER — Other Ambulatory Visit: Payer: Self-pay

## 2020-03-11 ENCOUNTER — Encounter: Payer: Self-pay | Admitting: Internal Medicine

## 2020-03-11 ENCOUNTER — Ambulatory Visit: Payer: Managed Care, Other (non HMO) | Admitting: Internal Medicine

## 2020-03-11 ENCOUNTER — Ambulatory Visit (INDEPENDENT_AMBULATORY_CARE_PROVIDER_SITE_OTHER): Payer: Managed Care, Other (non HMO) | Admitting: General Practice

## 2020-03-11 DIAGNOSIS — F1010 Alcohol abuse, uncomplicated: Secondary | ICD-10-CM

## 2020-03-11 DIAGNOSIS — I10 Essential (primary) hypertension: Secondary | ICD-10-CM

## 2020-03-11 DIAGNOSIS — R202 Paresthesia of skin: Secondary | ICD-10-CM

## 2020-03-11 DIAGNOSIS — Z7901 Long term (current) use of anticoagulants: Secondary | ICD-10-CM | POA: Diagnosis not present

## 2020-03-11 LAB — POCT INR: INR: 1.7 — AB (ref 2.0–3.0)

## 2020-03-11 LAB — URINE CULTURE: Culture: NO GROWTH

## 2020-03-11 MED ORDER — OLMESARTAN MEDOXOMIL 40 MG PO TABS: 40.0000 mg | ORAL_TABLET | Freq: Every day | ORAL | 3 refills | Status: DC

## 2020-03-11 NOTE — Progress Notes (Signed)
Medical screening examination/treatment/procedure(s) were performed by non-physician practitioner and as supervising physician I was immediately available for consultation/collaboration. I agree with above. Noha Karasik, MD   

## 2020-03-11 NOTE — Assessment & Plan Note (Signed)
Mod to severe uncontrolled with noncompliacne with meds, ok for benicar 40 qd, f/u BP at home and in 3 wks rov

## 2020-03-11 NOTE — Assessment & Plan Note (Signed)
inr mild low, has f/u inr due in 2 wks
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
## 2020-03-11 NOTE — Patient Instructions (Addendum)
Pre visit review using our clinic review tool, if applicable. No additional management support is needed unless otherwise documented below in the visit note.  Take 1 (5 mg) tablet daily.  Re-check in 2 weeks.

## 2020-03-11 NOTE — Patient Instructions (Signed)
Please consider wearing a wrestlers elbow pad for the elbows to protect the nerves  You can also wear Wrist Splints for the wrists at night only to help nerve pain there  Please take all new medication as prescribed - the benicar 40 mg per day  Please continue all other medications as before, and refills have been done if requested.  Please have the pharmacy call with any other refills you may need.  Please keep your appointments with your specialists as you may have planned  Please make an Appointment to return in 3 weeks

## 2020-03-11 NOTE — Assessment & Plan Note (Signed)
Encourage to abstain

## 2020-03-11 NOTE — Assessment & Plan Note (Addendum)
Pt wheelchair bound with firm arm supports; suspect mild ulnar neuritis and possible mild left cTS; for elbow pads during day, and wrist splints during the night  I spent 31 minutes in preparing to see the patient by review of recent labs, imaging and procedures, obtaining and reviewing separately obtained history, communicating with the patient and family or caregiver, ordering medications, tests or procedures, and documenting clinical information in the EHR including the differential Dx, treatment, and any further evaluation and other management of paresthesias, htn, etoh abuse, long term anticoagulation

## 2020-03-24 ENCOUNTER — Ambulatory Visit: Payer: Managed Care, Other (non HMO)

## 2020-03-25 ENCOUNTER — Ambulatory Visit: Payer: Managed Care, Other (non HMO)

## 2020-04-03 ENCOUNTER — Other Ambulatory Visit: Payer: Self-pay | Admitting: Internal Medicine

## 2020-08-22 ENCOUNTER — Other Ambulatory Visit: Payer: Self-pay

## 2020-08-22 ENCOUNTER — Ambulatory Visit: Payer: Managed Care, Other (non HMO) | Admitting: Internal Medicine

## 2020-08-22 ENCOUNTER — Encounter: Payer: Self-pay | Admitting: Internal Medicine

## 2020-08-22 VITALS — BP 170/110 | HR 92

## 2020-08-22 DIAGNOSIS — N390 Urinary tract infection, site not specified: Secondary | ICD-10-CM

## 2020-08-22 DIAGNOSIS — F1010 Alcohol abuse, uncomplicated: Secondary | ICD-10-CM

## 2020-08-22 DIAGNOSIS — Z0000 Encounter for general adult medical examination without abnormal findings: Secondary | ICD-10-CM | POA: Diagnosis not present

## 2020-08-22 DIAGNOSIS — F411 Generalized anxiety disorder: Secondary | ICD-10-CM | POA: Diagnosis not present

## 2020-08-22 DIAGNOSIS — I10 Essential (primary) hypertension: Secondary | ICD-10-CM | POA: Diagnosis not present

## 2020-08-22 DIAGNOSIS — G8220 Paraplegia, unspecified: Secondary | ICD-10-CM | POA: Diagnosis not present

## 2020-08-22 DIAGNOSIS — Z0001 Encounter for general adult medical examination with abnormal findings: Secondary | ICD-10-CM

## 2020-08-22 MED ORDER — OLMESARTAN MEDOXOMIL-HCTZ 40-12.5 MG PO TABS: 1.0000 | ORAL_TABLET | Freq: Every day | ORAL | 3 refills | Status: DC

## 2020-08-22 MED ORDER — CIPROFLOXACIN HCL 500 MG PO TABS
500.0000 mg | ORAL_TABLET | ORAL | 2 refills | Status: DC
Start: 2020-08-22 — End: 2021-01-16

## 2020-08-22 MED ORDER — ALPRAZOLAM 1 MG PO TABS
ORAL_TABLET | ORAL | 5 refills | Status: DC
Start: 2020-08-22 — End: 2021-01-16

## 2020-08-22 MED ORDER — CEFUROXIME AXETIL 250 MG PO TABS
250.0000 mg | ORAL_TABLET | ORAL | 2 refills | Status: DC
Start: 2020-08-22 — End: 2021-01-16

## 2020-08-22 NOTE — Assessment & Plan Note (Signed)
Needs new wheelchair  - gave rx for pt to take to local med supply store

## 2020-08-22 NOTE — Assessment & Plan Note (Signed)
Stable, to continue current med tx - xanax prn

## 2020-08-22 NOTE — Assessment & Plan Note (Signed)
Stable, conts to do well with current antibx prophylaxis

## 2020-08-22 NOTE — Assessment & Plan Note (Signed)

## 2020-08-22 NOTE — Assessment & Plan Note (Signed)
Mild uncontrolled, ok to change to benicar hct, f/u BP at home and next visit BP Readings from Last 3 Encounters:  08/22/20 (!) 170/110  03/11/20 (!) 170/110  03/10/20 (!) 150/89     Current Outpatient Medications (Cardiovascular):  .  olmesartan-hydrochlorothiazide (BENICAR HCT) 40-12.5 MG tablet, Take 1 tablet by mouth daily. .  sildenafil (VIAGRA) 100 MG tablet, Take 0.5-1 tablets (50-100 mg total) by mouth daily as needed for erectile dysfunction.     Current Outpatient Medications (Other):  Marland Kitchen  BLACK CURRANT SEED OIL PO, Take 5 mLs by mouth 2 (two) times a week. .  ALPRAZolam (XANAX) 1 MG tablet, 1 tab by mouth twice per day as needed .  cefUROXime (CEFTIN) 250 MG tablet, Take 1 tablet (250 mg total) by mouth See admin instructions. Take one tablet (250 mg) by mouth twice daily for about 3 days as needed for bladder pain/leakage .  ciprofloxacin (CIPRO) 500 MG tablet, Take 1 tablet (500 mg total) by mouth See admin instructions. Take one tablet (500 mg) by mouth twice daily for about 3 days as needed for bladder pain/leakage

## 2020-08-22 NOTE — Assessment & Plan Note (Signed)
Now abstinent, encouraged to continue
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
## 2020-08-22 NOTE — Patient Instructions (Signed)
Ok to change the benicar to the benicar HCT 40/12.5 mg - 1 per day  Please continue all other medications as before, and refills have been done if requested.  Please have the pharmacy call with any other refills you may need.  Please continue your efforts at being more active, low cholesterol diet, and weight control.  You are otherwise up to date with prevention measures today.  Please keep your appointments with your specialists as you may have planned  Please take all new medication as prescribed: New Wheelchair - you can go to any medical supply store such as [facility] on [address]  Please make sure to check your BP at home on a regular basis  Please make an Appointment to return in 1 month

## 2020-09-26 ENCOUNTER — Emergency Department (HOSPITAL_COMMUNITY)
Admission: EM | Admit: 2020-09-26 | Discharge: 2020-09-26 | Disposition: A | Discharge status: Home / Self Care | Payer: Managed Care, Other (non HMO) | Attending: Emergency Medicine | Admitting: Emergency Medicine

## 2020-09-26 ENCOUNTER — Other Ambulatory Visit: Payer: Self-pay

## 2020-09-26 ENCOUNTER — Encounter (HOSPITAL_COMMUNITY): Payer: Self-pay | Admitting: Emergency Medicine

## 2020-09-26 ENCOUNTER — Emergency Department (HOSPITAL_COMMUNITY): Payer: Managed Care, Other (non HMO)

## 2020-09-26 DIAGNOSIS — R030 Elevated blood-pressure reading, without diagnosis of hypertension: Secondary | ICD-10-CM

## 2020-09-26 DIAGNOSIS — W3400XA Accidental discharge from unspecified firearms or gun, initial encounter: Secondary | ICD-10-CM | POA: Insufficient documentation

## 2020-09-26 DIAGNOSIS — S20302A Unspecified superficial injuries of left front wall of thorax, initial encounter: Secondary | ICD-10-CM | POA: Diagnosis present

## 2020-09-26 DIAGNOSIS — F10920 Alcohol use, unspecified with intoxication, uncomplicated: Secondary | ICD-10-CM

## 2020-09-26 DIAGNOSIS — Z23 Encounter for immunization: Secondary | ICD-10-CM | POA: Diagnosis not present

## 2020-09-26 DIAGNOSIS — S21302A Unspecified open wound of left front wall of thorax with penetration into thoracic cavity, initial encounter: Secondary | ICD-10-CM | POA: Insufficient documentation

## 2020-09-26 DIAGNOSIS — E876 Hypokalemia: Secondary | ICD-10-CM

## 2020-09-26 DIAGNOSIS — S21132A Puncture wound without foreign body of left front wall of thorax without penetration into thoracic cavity, initial encounter: Secondary | ICD-10-CM

## 2020-09-26 LAB — URINALYSIS, ROUTINE W REFLEX MICROSCOPIC
Bilirubin Urine: NEGATIVE
Glucose, UA: NEGATIVE mg/dL
Hgb urine dipstick: NEGATIVE
Ketones, ur: NEGATIVE mg/dL
Leukocytes,Ua: NEGATIVE
Nitrite: NEGATIVE
Protein, ur: NEGATIVE mg/dL
Specific Gravity, Urine: 1.003 — ABNORMAL LOW (ref 1.005–1.030)
pH: 6 (ref 5.0–8.0)

## 2020-09-26 LAB — I-STAT CHEM 8, ED
BUN: 12 mg/dL (ref 6–20)
Calcium, Ion: 1.05 mmol/L — ABNORMAL LOW (ref 1.15–1.40)
Chloride: 104 mmol/L (ref 98–111)
Creatinine, Ser: 1.6 mg/dL — ABNORMAL HIGH (ref 0.61–1.24)
Glucose, Bld: 114 mg/dL — ABNORMAL HIGH (ref 70–99)
HCT: 46 % (ref 39.0–52.0)
Hemoglobin: 15.6 g/dL (ref 13.0–17.0)
Potassium: 3.4 mmol/L — ABNORMAL LOW (ref 3.5–5.1)
Sodium: 141 mmol/L (ref 135–145)
TCO2: 25 mmol/L (ref 22–32)

## 2020-09-26 LAB — ETHANOL: Alcohol, Ethyl (B): 321 mg/dL (ref <10)

## 2020-09-26 LAB — CBC
HCT: 41.7 % (ref 39.0–52.0)
Hemoglobin: 14 g/dL (ref 13.0–17.0)
MCH: 31 pg (ref 26.0–34.0)
MCHC: 33.6 g/dL (ref 30.0–36.0)
MCV: 92.3 fL (ref 80.0–100.0)
Platelets: 367 10*3/uL (ref 150–400)
RBC: 4.52 MIL/uL (ref 4.22–5.81)
RDW: 16.4 % — ABNORMAL HIGH (ref 11.5–15.5)
WBC: 6.3 10*3/uL (ref 4.0–10.5)
nRBC: 0 % (ref 0.0–0.2)

## 2020-09-26 LAB — PROTIME-INR
INR: 1.1 (ref 0.8–1.2)
Prothrombin Time: 13.5 seconds (ref 11.4–15.2)

## 2020-09-26 LAB — SAMPLE TO BLOOD BANK

## 2020-09-26 LAB — COMPREHENSIVE METABOLIC PANEL
ALT: 35 U/L (ref 0–44)
AST: 60 U/L — ABNORMAL HIGH (ref 15–41)
Albumin: 3.9 g/dL (ref 3.5–5.0)
Alkaline Phosphatase: 55 U/L (ref 38–126)
Anion gap: 10 (ref 5–15)
BUN: 11 mg/dL (ref 6–20)
CO2: 24 mmol/L (ref 22–32)
Calcium: 8.3 mg/dL — ABNORMAL LOW (ref 8.9–10.3)
Chloride: 103 mmol/L (ref 98–111)
Creatinine, Ser: 1.17 mg/dL (ref 0.61–1.24)
GFR, Estimated: >60 mL/min (ref >60)
Glucose, Bld: 113 mg/dL — ABNORMAL HIGH (ref 70–99)
Potassium: 3.7 mmol/L (ref 3.5–5.1)
Sodium: 137 mmol/L (ref 135–145)
Total Bilirubin: 1 mg/dL (ref 0.3–1.2)
Total Protein: 7.7 g/dL (ref 6.5–8.1)

## 2020-09-26 LAB — LACTIC ACID, PLASMA: Lactic Acid, Venous: 2.6 mmol/L (ref 0.5–1.9)

## 2020-09-26 MED ORDER — OXYCODONE-ACETAMINOPHEN 5-325 MG PO TABS
1.0000 | ORAL_TABLET | Freq: Once | ORAL | Status: AC
  Administered 2020-09-26: 1 via ORAL
  Filled 2020-09-26: qty 1

## 2020-09-26 MED ORDER — POTASSIUM CHLORIDE CRYS ER 20 MEQ PO TBCR
40.0000 meq | EXTENDED_RELEASE_TABLET | Freq: Once | ORAL | Status: AC
  Administered 2020-09-26: 40 meq via ORAL
  Filled 2020-09-26: qty 2

## 2020-09-26 MED ORDER — CEPHALEXIN 250 MG PO CAPS
1000.0000 mg | ORAL_CAPSULE | Freq: Once | ORAL | Status: AC
  Administered 2020-09-26: 1000 mg via ORAL
  Filled 2020-09-26: qty 4

## 2020-09-26 MED ORDER — SODIUM CHLORIDE 0.9 % IV SOLN
INTRAVENOUS | Status: AC | PRN
  Administered 2020-09-26: 999 mL via INTRAVENOUS

## 2020-09-26 MED ORDER — CEPHALEXIN 500 MG PO CAPS: 500.0000 mg | ORAL_CAPSULE | Freq: Four times a day (QID) | ORAL | 0 refills | Status: DC

## 2020-09-26 MED ORDER — TETANUS-DIPHTH-ACELL PERTUSSIS 5-2.5-18.5 LF-MCG/0.5 IM SUSY
0.5000 mL | PREFILLED_SYRINGE | Freq: Once | INTRAMUSCULAR | Status: AC
  Administered 2020-09-26: 0.5 mL via INTRAMUSCULAR
  Filled 2020-09-26: qty 0.5
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
## 2020-09-26 NOTE — ED Notes (Signed)
Pt offered water and a snack
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
## 2020-09-26 NOTE — ED Notes (Signed)
Per Dr [name] Lank, No CT scans for patient.
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
## 2020-09-26 NOTE — ED Notes (Signed)
Pt removed all VS monitoring, attempting to get out of bed. States "ill crawl on the ground and steal the 1st wheelchair I find"

## 2020-09-26 NOTE — Progress Notes (Signed)
   09/26/20 0235  Clinical Encounter Type  Visited With Patient  Visit Type Trauma  Referral From Nurse  Consult/Referral To Chaplain  Chaplain responded. The patient is being treated by the medical team. He appears to be agitated, requesting for his mother, Edmonia Caprio. The chaplain provided a brief moment of presence hoping to calm the patient.  No family was present during my encounter. The chaplain remains available if needed. This note was prepared by Deneen Harts, M.Div..  For questions please contact by phone [phone].
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
## 2020-09-26 NOTE — Discharge Instructions (Addendum)
It was our pleasure to provide your ER care today - we hope that you feel better.  Keep wounds very clean - wash with warm water and soap 2x/day.  Take acetaminophen or ibuprofen as need for pain.  Take keflex (antibiotic) as prescribed.   Your blood pressure is high tonight - follow up with primary care doctor in 1-2 weeks.  Your potassium level is slightly low - eat plenty of fruits and vegetables and follow up with primary care doctor.   Avoid alcohol use - see resource guide provided as relates treatment programs.   Return to ER if worse, new symptoms, trouble breathing, infection of wounds, fevers, or other concern.

## 2020-09-27 ENCOUNTER — Encounter: Payer: Self-pay | Admitting: Internal Medicine

## 2020-09-30 ENCOUNTER — Other Ambulatory Visit: Payer: Self-pay | Admitting: Internal Medicine

## 2020-10-09 ENCOUNTER — Telehealth: Payer: Self-pay

## 2020-10-09 DIAGNOSIS — G8220 Paraplegia, unspecified: Secondary | ICD-10-CM

## 2020-10-09 NOTE — Telephone Encounter (Addendum)
    Patients mother calling to request order for  "specialized" wheelchair, and wheelchair cushion be sent to [employer].   Please call patients mother [phone]

## 2020-10-09 NOTE — Telephone Encounter (Signed)
Ok we will fax order

## 2020-10-13 NOTE — Telephone Encounter (Signed)
Has been faxed.

## 2020-10-24 NOTE — Telephone Encounter (Signed)
   Patients mother calling to request order for wheelchair be faxed again to  [phone] [employer]

## 2020-10-27 NOTE — Telephone Encounter (Signed)
No since I dont why it has to faxed again  Maybe the reason was the first one was not done correctly.  If we do the same wrong thing again, that would be kind of dumb

## 2020-11-01 ENCOUNTER — Other Ambulatory Visit: Payer: Self-pay | Admitting: Internal Medicine

## 2020-11-03 NOTE — Addendum Note (Signed)
Addended by: Corwin Levins on: 11/03/2020 01:08 PM   Modules accepted: Orders

## 2020-11-03 NOTE — Telephone Encounter (Signed)
Order has been faxed

## 2020-11-03 NOTE — Telephone Encounter (Signed)
Done hardcopy 

## 2020-11-04 NOTE — Telephone Encounter (Signed)
Ok but this is the third time  I will not fax any further orders if the request changes again

## 2020-11-04 NOTE — Telephone Encounter (Signed)
Patients mother called and said that the fax that was sent to [employer] does not state a "specialized" wheelchair. Also, she is needing an order for a wheelchair cushion. Please advise.

## 2020-11-04 NOTE — Telephone Encounter (Signed)
Orders refaxed.

## 2020-11-04 NOTE — Addendum Note (Signed)
Addended by: Corwin Levins on: 11/04/2020 12:51 PM   Modules accepted: Orders

## 2020-11-09 ENCOUNTER — Telehealth: Payer: Self-pay | Admitting: Internal Medicine

## 2020-11-10 NOTE — Telephone Encounter (Signed)
   Patients mother calling, Should patient be taking Amlodipine?  Please call

## 2020-11-10 NOTE — Telephone Encounter (Signed)
Tried calling patient and patient's mom. No answer, and unable to leave a message.

## 2020-11-13 NOTE — Telephone Encounter (Signed)
Tried calling patient and patient's mom, no answer. Left message to call me back. Patient should not be taking Amlodipine per Dr. Jonny Ruiz.

## 2020-11-13 NOTE — Telephone Encounter (Signed)
No, this is no longer on his med list

## 2020-12-19 NOTE — Telephone Encounter (Signed)
   Patients mother calling to request same order be faxed again for specialized wheelchair with cushion to [employer]

## 2020-12-22 NOTE — Telephone Encounter (Signed)
No I cannot this time as this is the fourth time  If the first four orders were not acceptable, then a 5th is unlikely to work as well  I will be happy to sign any order sent to me by [employer] for a specific wheelchair that would be the correct one or the correct way of ordering

## 2020-12-22 NOTE — Telephone Encounter (Signed)
Returned patients mother call unable to leave voicemail due to box being full.

## 2020-12-26 NOTE — Telephone Encounter (Signed)
Please return call to patients mother

## 2020-12-26 NOTE — Telephone Encounter (Signed)
Spoke with patient's mom regarding cushion for wheelchair. Per patient's mom, [place] and the cushion he currently is using, the stuffing is coming out. Advised patient's mom to have [employer] fax [place] the order so that Dr. Jonny Ruiz can sign
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
## 2021-01-02 NOTE — Telephone Encounter (Signed)
Unable to reach patient's mother. 
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
## 2021-01-16 ENCOUNTER — Encounter: Payer: Self-pay | Admitting: Internal Medicine

## 2021-01-16 ENCOUNTER — Other Ambulatory Visit (INDEPENDENT_AMBULATORY_CARE_PROVIDER_SITE_OTHER): Payer: Managed Care, Other (non HMO)

## 2021-01-16 ENCOUNTER — Ambulatory Visit: Payer: Managed Care, Other (non HMO) | Admitting: Internal Medicine

## 2021-01-16 ENCOUNTER — Other Ambulatory Visit: Payer: Self-pay

## 2021-01-16 VITALS — BP 152/80 | HR 122 | Temp 97.8°F | Ht 76.0 in | Wt 208.0 lb

## 2021-01-16 DIAGNOSIS — N289 Disorder of kidney and ureter, unspecified: Secondary | ICD-10-CM

## 2021-01-16 DIAGNOSIS — Z0000 Encounter for general adult medical examination without abnormal findings: Secondary | ICD-10-CM

## 2021-01-16 DIAGNOSIS — E538 Deficiency of other specified B group vitamins: Secondary | ICD-10-CM | POA: Diagnosis not present

## 2021-01-16 DIAGNOSIS — E559 Vitamin D deficiency, unspecified: Secondary | ICD-10-CM | POA: Diagnosis not present

## 2021-01-16 DIAGNOSIS — R600 Localized edema: Secondary | ICD-10-CM | POA: Diagnosis not present

## 2021-01-16 DIAGNOSIS — G8220 Paraplegia, unspecified: Secondary | ICD-10-CM

## 2021-01-16 DIAGNOSIS — R739 Hyperglycemia, unspecified: Secondary | ICD-10-CM

## 2021-01-16 DIAGNOSIS — Z0001 Encounter for general adult medical examination with abnormal findings: Secondary | ICD-10-CM | POA: Diagnosis not present

## 2021-01-16 DIAGNOSIS — I10 Essential (primary) hypertension: Secondary | ICD-10-CM

## 2021-01-16 HISTORY — DX: Disorder of kidney and ureter, unspecified: N28.9

## 2021-01-16 LAB — CBC WITH DIFFERENTIAL/PLATELET
Basophils Absolute: 0 10*3/uL (ref 0.0–0.1)
Basophils Relative: 0.6 % (ref 0.0–3.0)
Eosinophils Absolute: 0.3 10*3/uL (ref 0.0–0.7)
Eosinophils Relative: 3.8 % (ref 0.0–5.0)
HCT: 35.2 % — ABNORMAL LOW (ref 39.0–52.0)
Hemoglobin: 12 g/dL — ABNORMAL LOW (ref 13.0–17.0)
Lymphocytes Relative: 19.8 % (ref 12.0–46.0)
Lymphs Abs: 1.4 10*3/uL (ref 0.7–4.0)
MCHC: 34.1 g/dL (ref 30.0–36.0)
MCV: 85.9 fl (ref 78.0–100.0)
Monocytes Absolute: 0.8 10*3/uL (ref 0.1–1.0)
Monocytes Relative: 10.6 % (ref 3.0–12.0)
Neutro Abs: 4.7 10*3/uL (ref 1.4–7.7)
Neutrophils Relative %: 65.2 % (ref 43.0–77.0)
Platelets: 292 10*3/uL (ref 150.0–400.0)
RBC: 4.09 Mil/uL — ABNORMAL LOW (ref 4.22–5.81)
RDW: 13.2 % (ref 11.5–15.5)
WBC: 7.2 10*3/uL (ref 4.0–10.5)

## 2021-01-16 LAB — URINALYSIS, ROUTINE W REFLEX MICROSCOPIC
Bilirubin Urine: NEGATIVE
Nitrite: NEGATIVE
Specific Gravity, Urine: 1.02 (ref 1.000–1.030)
Urine Glucose: 100 — AB
Urobilinogen, UA: 1 (ref 0.0–1.0)
pH: 6 (ref 5.0–8.0)

## 2021-01-16 LAB — HEMOGLOBIN A1C: Hgb A1c MFr Bld: 5.5 % (ref 4.6–6.5)

## 2021-01-16 MED ORDER — OLMESARTAN MEDOXOMIL-HCTZ 40-12.5 MG PO TABS: 1.0000 | ORAL_TABLET | Freq: Every day | ORAL | 3 refills | Status: AC

## 2021-01-16 MED ORDER — ALPRAZOLAM 1 MG PO TABS
ORAL_TABLET | ORAL | 5 refills | Status: DC
Start: 2021-01-16 — End: 2021-02-24

## 2021-01-16 MED ORDER — CEFUROXIME AXETIL 250 MG PO TABS
250.0000 mg | ORAL_TABLET | ORAL | 2 refills | Status: DC
Start: 2021-01-16 — End: 2021-02-24

## 2021-01-16 MED ORDER — SILDENAFIL CITRATE 100 MG PO TABS: 50.0000 mg | ORAL_TABLET | Freq: Every day | ORAL | 11 refills | Status: DC | PRN

## 2021-01-16 MED ORDER — CIPROFLOXACIN HCL 500 MG PO TABS
500.0000 mg | ORAL_TABLET | ORAL | 2 refills | Status: DC
Start: 2021-01-16 — End: 2021-02-24

## 2021-01-16 NOTE — Assessment & Plan Note (Signed)
Mild Sep 26, 2020 - for a1c with labs,  to f/u any worsening symptoms or concerns

## 2021-01-16 NOTE — Patient Instructions (Signed)
You are given the note for the ankle bracelet removal  Please continue all other medications as before, and refills have been done if requested.  Please have the pharmacy call with any other refills you may need.  Please continue your efforts at being more active, low cholesterol diet, and weight control.  You are otherwise up to date with prevention measures today.  Please keep your appointments with your specialists as you may have planned  You will be contacted regarding the referral for: Outpatient PT for wheelchair evaluation  Please go to the LAB at the blood drawing area for the tests to be done  You will be contacted by phone if any changes need to be made immediately.  Otherwise, you will receive a letter about your results with an explanation, but please check with MyChart first.  Please remember to sign up for MyChart if you have not done so, as this will be important to you in the future with finding out test results, communicating by private email, and scheduling acute appointments online when needed.  Please make an Appointment to return in 6 months, or sooner if needed
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
## 2021-01-16 NOTE — Assessment & Plan Note (Signed)
Age and sex appropriate education and counseling updated with regular exercise and diet Referrals for preventative services - none needed Immunizations addressed - declines covid vax and pneumovax Smoking counseling  - none needed Evidence for depression or other mood disorder - none significant Most recent labs reviewed. I have personally reviewed and have noted: 1) the patient's medical and social history 2) The patient's current medications and supplements 3) The patient's height, weight, and BMI have been recorded in the chart  

## 2021-01-16 NOTE — Assessment & Plan Note (Signed)
Needs outpt PT exam for wheelchair replacement

## 2021-01-16 NOTE — Assessment & Plan Note (Signed)
Mild apr 1 due to low volume, for fu lab today

## 2021-01-16 NOTE — Assessment & Plan Note (Signed)
C/w dependent edema, for micardis HCT restart, and note given for ok to remove ankle bracelet
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
## 2021-01-19 ENCOUNTER — Encounter: Payer: Self-pay | Admitting: Internal Medicine

## 2021-01-19 LAB — LDL CHOLESTEROL, DIRECT: Direct LDL: 66 mg/dL

## 2021-01-19 LAB — HEPATIC FUNCTION PANEL
ALT: 23 U/L (ref 0–53)
AST: 33 U/L (ref 0–37)
Albumin: 4 g/dL (ref 3.5–5.2)
Alkaline Phosphatase: 66 U/L (ref 39–117)
Bilirubin, Direct: 0.1 mg/dL (ref 0.0–0.3)
Total Bilirubin: 0.6 mg/dL (ref 0.2–1.2)
Total Protein: 7.1 g/dL (ref 6.0–8.3)

## 2021-01-19 LAB — LIPID PANEL
Cholesterol: 174 mg/dL (ref 0–200)
HDL: 43 mg/dL (ref >39.00)
Total CHOL/HDL Ratio: 4
Triglycerides: 476 mg/dL — ABNORMAL HIGH (ref 0.0–149.0)

## 2021-01-19 LAB — BASIC METABOLIC PANEL
BUN: 19 mg/dL (ref 6–23)
CO2: 23 mEq/L (ref 19–32)
Calcium: 8.8 mg/dL (ref 8.4–10.5)
Chloride: 103 mEq/L (ref 96–112)
Creatinine, Ser: 1.01 mg/dL (ref 0.40–1.50)
GFR: 91.99 mL/min (ref >60.00)
Glucose, Bld: 99 mg/dL (ref 70–99)
Potassium: 3.7 mEq/L (ref 3.5–5.1)
Sodium: 140 mEq/L (ref 135–145)

## 2021-01-19 LAB — VITAMIN D 25 HYDROXY (VIT D DEFICIENCY, FRACTURES): VITD: 7.45 ng/mL — ABNORMAL LOW (ref 30.00–100.00)

## 2021-01-19 LAB — PSA: PSA: 0.81 ng/mL (ref 0.10–4.00)

## 2021-01-19 LAB — TSH: TSH: 0.83 u[IU]/mL (ref 0.35–5.50)

## 2021-01-19 LAB — VITAMIN B12: Vitamin B-12: 230 pg/mL (ref 211–911)

## 2021-01-21 ENCOUNTER — Telehealth: Payer: Self-pay

## 2021-01-21 NOTE — Telephone Encounter (Signed)
Left message for patient to call back  

## 2021-01-21 NOTE — Telephone Encounter (Signed)
Sorry I have no idea, but will forward to [facility]

## 2021-01-25 ENCOUNTER — Encounter: Payer: Self-pay | Admitting: Internal Medicine

## 2021-02-09 ENCOUNTER — Telehealth: Payer: Self-pay | Admitting: Internal Medicine

## 2021-02-09 NOTE — Telephone Encounter (Signed)
Patient came in 1-2 weeks ago & seen provider for bed sore on bottom  Patient says he was trying to let it heal on its own but he now believes its infected  Wants to know if there can be an antibiotic & cream sent to pharmacy to help w/ this  Pharmacy: [facility] [phone] - [place], [place] - [address] AT [address] [facility] [address] & [address]  Phone:  [phone] Fax:  [phone]  Callback #: [phone]

## 2021-02-10 ENCOUNTER — Emergency Department (HOSPITAL_COMMUNITY): Payer: Managed Care, Other (non HMO) | Admitting: Certified Registered"

## 2021-02-10 ENCOUNTER — Emergency Department (HOSPITAL_COMMUNITY): Payer: Managed Care, Other (non HMO)

## 2021-02-10 ENCOUNTER — Inpatient Hospital Stay (HOSPITAL_COMMUNITY)
Admission: EM | Admit: 2021-02-10 | Discharge: 2021-02-24 | DRG: 853 | Disposition: A | Discharge status: Skilled Nursing Facility | Payer: Managed Care, Other (non HMO) | Attending: Internal Medicine | Admitting: Internal Medicine

## 2021-02-10 ENCOUNTER — Encounter (HOSPITAL_COMMUNITY): Payer: Self-pay

## 2021-02-10 ENCOUNTER — Encounter (HOSPITAL_COMMUNITY)
Admission: EM | Disposition: A | Discharge status: Skilled Nursing Facility | Payer: Self-pay | Source: Home / Self Care | Attending: Family Medicine

## 2021-02-10 DIAGNOSIS — Z86718 Personal history of other venous thrombosis and embolism: Secondary | ICD-10-CM | POA: Diagnosis not present

## 2021-02-10 DIAGNOSIS — M726 Necrotizing fasciitis: Secondary | ICD-10-CM | POA: Diagnosis present

## 2021-02-10 DIAGNOSIS — I10 Essential (primary) hypertension: Secondary | ICD-10-CM | POA: Diagnosis present

## 2021-02-10 DIAGNOSIS — I313 Pericardial effusion (noninflammatory): Secondary | ICD-10-CM | POA: Diagnosis present

## 2021-02-10 DIAGNOSIS — Z20822 Contact with and (suspected) exposure to covid-19: Secondary | ICD-10-CM | POA: Diagnosis present

## 2021-02-10 DIAGNOSIS — E878 Other disorders of electrolyte and fluid balance, not elsewhere classified: Secondary | ICD-10-CM | POA: Diagnosis present

## 2021-02-10 DIAGNOSIS — G8220 Paraplegia, unspecified: Secondary | ICD-10-CM | POA: Diagnosis present

## 2021-02-10 DIAGNOSIS — Z993 Dependence on wheelchair: Secondary | ICD-10-CM

## 2021-02-10 DIAGNOSIS — F1010 Alcohol abuse, uncomplicated: Secondary | ICD-10-CM | POA: Diagnosis present

## 2021-02-10 DIAGNOSIS — E871 Hypo-osmolality and hyponatremia: Secondary | ICD-10-CM

## 2021-02-10 DIAGNOSIS — Z5900 Homelessness unspecified: Secondary | ICD-10-CM | POA: Diagnosis not present

## 2021-02-10 DIAGNOSIS — R45851 Suicidal ideations: Secondary | ICD-10-CM | POA: Diagnosis not present

## 2021-02-10 DIAGNOSIS — F331 Major depressive disorder, recurrent, moderate: Secondary | ICD-10-CM

## 2021-02-10 DIAGNOSIS — Z95828 Presence of other vascular implants and grafts: Secondary | ICD-10-CM

## 2021-02-10 DIAGNOSIS — Z789 Other specified health status: Secondary | ICD-10-CM

## 2021-02-10 DIAGNOSIS — L8990 Pressure ulcer of unspecified site, unspecified stage: Secondary | ICD-10-CM | POA: Insufficient documentation

## 2021-02-10 DIAGNOSIS — F1090 Alcohol use, unspecified, uncomplicated: Secondary | ICD-10-CM

## 2021-02-10 DIAGNOSIS — N319 Neuromuscular dysfunction of bladder, unspecified: Secondary | ICD-10-CM

## 2021-02-10 DIAGNOSIS — A4102 Sepsis due to Methicillin resistant Staphylococcus aureus: Principal | ICD-10-CM | POA: Diagnosis present

## 2021-02-10 DIAGNOSIS — K219 Gastro-esophageal reflux disease without esophagitis: Secondary | ICD-10-CM | POA: Diagnosis present

## 2021-02-10 DIAGNOSIS — L89893 Pressure ulcer of other site, stage 3: Secondary | ICD-10-CM | POA: Diagnosis present

## 2021-02-10 DIAGNOSIS — R509 Fever, unspecified: Secondary | ICD-10-CM | POA: Diagnosis present

## 2021-02-10 DIAGNOSIS — A419 Sepsis, unspecified organism: Secondary | ICD-10-CM | POA: Diagnosis not present

## 2021-02-10 DIAGNOSIS — F32A Depression, unspecified: Secondary | ICD-10-CM | POA: Diagnosis not present

## 2021-02-10 DIAGNOSIS — W3400XS Accidental discharge from unspecified firearms or gun, sequela: Secondary | ICD-10-CM

## 2021-02-10 DIAGNOSIS — D649 Anemia, unspecified: Secondary | ICD-10-CM | POA: Diagnosis present

## 2021-02-10 DIAGNOSIS — N493 Fournier gangrene: Secondary | ICD-10-CM | POA: Insufficient documentation

## 2021-02-10 DIAGNOSIS — L89626 Pressure-induced deep tissue damage of left heel: Secondary | ICD-10-CM | POA: Diagnosis present

## 2021-02-10 DIAGNOSIS — N289 Disorder of kidney and ureter, unspecified: Secondary | ICD-10-CM

## 2021-02-10 DIAGNOSIS — E876 Hypokalemia: Secondary | ICD-10-CM | POA: Diagnosis present

## 2021-02-10 DIAGNOSIS — L89612 Pressure ulcer of right heel, stage 2: Secondary | ICD-10-CM | POA: Diagnosis present

## 2021-02-10 DIAGNOSIS — L89896 Pressure-induced deep tissue damage of other site: Secondary | ICD-10-CM | POA: Diagnosis present

## 2021-02-10 DIAGNOSIS — F411 Generalized anxiety disorder: Secondary | ICD-10-CM | POA: Diagnosis not present

## 2021-02-10 DIAGNOSIS — Z79899 Other long term (current) drug therapy: Secondary | ICD-10-CM

## 2021-02-10 DIAGNOSIS — Z9114 Patient's other noncompliance with medication regimen: Secondary | ICD-10-CM

## 2021-02-10 DIAGNOSIS — R197 Diarrhea, unspecified: Secondary | ICD-10-CM | POA: Insufficient documentation

## 2021-02-10 DIAGNOSIS — Z7289 Other problems related to lifestyle: Secondary | ICD-10-CM

## 2021-02-10 HISTORY — PX: IRRIGATION AND DEBRIDEMENT ABSCESS: SHX5252

## 2021-02-10 LAB — CBC WITH DIFFERENTIAL/PLATELET
Abs Immature Granulocytes: 0.45 10*3/uL — ABNORMAL HIGH (ref 0.00–0.07)
Basophils Absolute: 0.1 10*3/uL (ref 0.0–0.1)
Basophils Relative: 0 %
Eosinophils Absolute: 0.1 10*3/uL (ref 0.0–0.5)
Eosinophils Relative: 0 %
HCT: 30.7 % — ABNORMAL LOW (ref 39.0–52.0)
Hemoglobin: 10.1 g/dL — ABNORMAL LOW (ref 13.0–17.0)
Immature Granulocytes: 2 %
Lymphocytes Relative: 5 %
Lymphs Abs: 1.2 10*3/uL (ref 0.7–4.0)
MCH: 29.8 pg (ref 26.0–34.0)
MCHC: 32.9 g/dL (ref 30.0–36.0)
MCV: 90.6 fL (ref 80.0–100.0)
Monocytes Absolute: 2.9 10*3/uL — ABNORMAL HIGH (ref 0.1–1.0)
Monocytes Relative: 12 %
Neutro Abs: 19.3 10*3/uL — ABNORMAL HIGH (ref 1.7–7.7)
Neutrophils Relative %: 81 %
Platelets: 496 10*3/uL — ABNORMAL HIGH (ref 150–400)
RBC: 3.39 MIL/uL — ABNORMAL LOW (ref 4.22–5.81)
RDW: 16.3 % — ABNORMAL HIGH (ref 11.5–15.5)
WBC: 23.9 10*3/uL — ABNORMAL HIGH (ref 4.0–10.5)
nRBC: 0 % (ref 0.0–0.2)

## 2021-02-10 LAB — I-STAT CHEM 8, ED
BUN: 13 mg/dL (ref 6–20)
Calcium, Ion: 1.12 mmol/L — ABNORMAL LOW (ref 1.15–1.40)
Chloride: 92 mmol/L — ABNORMAL LOW (ref 98–111)
Creatinine, Ser: 1.2 mg/dL (ref 0.61–1.24)
Glucose, Bld: 119 mg/dL — ABNORMAL HIGH (ref 70–99)
HCT: 33 % — ABNORMAL LOW (ref 39.0–52.0)
Hemoglobin: 11.2 g/dL — ABNORMAL LOW (ref 13.0–17.0)
Potassium: 3.4 mmol/L — ABNORMAL LOW (ref 3.5–5.1)
Sodium: 128 mmol/L — ABNORMAL LOW (ref 135–145)
TCO2: 24 mmol/L (ref 22–32)

## 2021-02-10 LAB — COMPREHENSIVE METABOLIC PANEL
ALT: 17 U/L (ref 0–44)
AST: 22 U/L (ref 15–41)
Albumin: 2.8 g/dL — ABNORMAL LOW (ref 3.5–5.0)
Alkaline Phosphatase: 56 U/L (ref 38–126)
Anion gap: 12 (ref 5–15)
BUN: 14 mg/dL (ref 6–20)
CO2: 25 mmol/L (ref 22–32)
Calcium: 8.2 mg/dL — ABNORMAL LOW (ref 8.9–10.3)
Chloride: 93 mmol/L — ABNORMAL LOW (ref 98–111)
Creatinine, Ser: 1.26 mg/dL — ABNORMAL HIGH (ref 0.61–1.24)
GFR, Estimated: >60 mL/min (ref >60)
Glucose, Bld: 121 mg/dL — ABNORMAL HIGH (ref 70–99)
Potassium: 3.4 mmol/L — ABNORMAL LOW (ref 3.5–5.1)
Sodium: 130 mmol/L — ABNORMAL LOW (ref 135–145)
Total Bilirubin: 1.5 mg/dL — ABNORMAL HIGH (ref 0.3–1.2)
Total Protein: 7.8 g/dL (ref 6.5–8.1)

## 2021-02-10 LAB — URINALYSIS, ROUTINE W REFLEX MICROSCOPIC
Bacteria, UA: NONE SEEN
Bilirubin Urine: NEGATIVE
Glucose, UA: NEGATIVE mg/dL
Ketones, ur: NEGATIVE mg/dL
Leukocytes,Ua: NEGATIVE
Nitrite: NEGATIVE
Protein, ur: 100 mg/dL — AB
Specific Gravity, Urine: 1.025 (ref 1.005–1.030)
pH: 5 (ref 5.0–8.0)

## 2021-02-10 LAB — PROTIME-INR
INR: 1.3 — ABNORMAL HIGH (ref 0.8–1.2)
Prothrombin Time: 16.6 seconds — ABNORMAL HIGH (ref 11.4–15.2)

## 2021-02-10 LAB — LACTIC ACID, PLASMA
Lactic Acid, Venous: 1.5 mmol/L (ref 0.5–1.9)
Lactic Acid, Venous: 1.2 mmol/L (ref 0.5–1.9)

## 2021-02-10 LAB — APTT: aPTT: 35 seconds (ref 24–36)

## 2021-02-10 LAB — RESP PANEL BY RT-PCR (FLU A&B, COVID) ARPGX2
Influenza A by PCR: NEGATIVE
Influenza B by PCR: NEGATIVE
SARS Coronavirus 2 by RT PCR: NEGATIVE

## 2021-02-10 SURGERY — IRRIGATION AND DEBRIDEMENT ABSCESS
Anesthesia: General | Site: Buttocks | Laterality: Left

## 2021-02-10 MED ORDER — ACETAMINOPHEN 500 MG PO TABS
1000.0000 mg | ORAL_TABLET | ORAL | Status: AC
  Administered 2021-02-10: 1000 mg via ORAL

## 2021-02-10 MED ORDER — ALUM & MAG HYDROXIDE-SIMETH 200-200-20 MG/5ML PO SUSP: 30.0000 mL | Freq: Four times a day (QID) | ORAL | Status: DC | PRN

## 2021-02-10 MED ORDER — AMISULPRIDE (ANTIEMETIC) 5 MG/2ML IV SOLN: 10.0000 mg | Freq: Once | INTRAVENOUS | Status: DC | PRN

## 2021-02-10 MED ORDER — SODIUM CHLORIDE 0.9 % IV SOLN
2.0000 g | Freq: Three times a day (TID) | INTRAVENOUS | Status: DC
  Administered 2021-02-10 – 2021-02-12 (×5): 2 g via INTRAVENOUS
  Filled 2021-02-10 (×5): qty 2

## 2021-02-10 MED ORDER — FENTANYL CITRATE (PF) 250 MCG/5ML IJ SOLN
INTRAMUSCULAR | Status: DC | PRN
  Administered 2021-02-10 (×2): 50 ug via INTRAVENOUS

## 2021-02-10 MED ORDER — LACTATED RINGERS IV SOLN: INTRAVENOUS | Status: DC

## 2021-02-10 MED ORDER — ALPRAZOLAM 1 MG PO TABS: 1.0000 mg | ORAL_TABLET | Freq: Every evening | ORAL | Status: DC | PRN

## 2021-02-10 MED ORDER — VANCOMYCIN HCL 2000 MG/400ML IV SOLN
2000.0000 mg | Freq: Once | INTRAVENOUS | Status: AC
  Administered 2021-02-10: 2000 mg via INTRAVENOUS
  Filled 2021-02-10: qty 400

## 2021-02-10 MED ORDER — VANCOMYCIN HCL IN DEXTROSE 1-5 GM/200ML-% IV SOLN
1000.0000 mg | Freq: Once | INTRAVENOUS | Status: DC
  Filled 2021-02-10: qty 200

## 2021-02-10 MED ORDER — CELECOXIB 200 MG PO CAPS
ORAL_CAPSULE | ORAL | Status: AC
  Filled 2021-02-10: qty 1

## 2021-02-10 MED ORDER — IOHEXOL 350 MG/ML SOLN
100.0000 mL | Freq: Once | INTRAVENOUS | Status: AC | PRN
  Administered 2021-02-10: 80 mL via INTRAVENOUS

## 2021-02-10 MED ORDER — ACETAMINOPHEN 500 MG PO TABS
ORAL_TABLET | ORAL | Status: AC
  Filled 2021-02-10: qty 2

## 2021-02-10 MED ORDER — DEXAMETHASONE SODIUM PHOSPHATE 10 MG/ML IJ SOLN
INTRAMUSCULAR | Status: AC
  Filled 2021-02-10: qty 1

## 2021-02-10 MED ORDER — CLINDAMYCIN PHOSPHATE 600 MG/50ML IV SOLN: 600.0000 mg | Freq: Three times a day (TID) | INTRAVENOUS | Status: DC

## 2021-02-10 MED ORDER — TAB-A-VITE/IRON PO TABS
1.0000 | ORAL_TABLET | Freq: Every day | ORAL | Status: DC
  Administered 2021-02-11 – 2021-02-24 (×13): 1 via ORAL
  Filled 2021-02-10 (×14): qty 1

## 2021-02-10 MED ORDER — GABAPENTIN 300 MG PO CAPS
ORAL_CAPSULE | ORAL | Status: AC
  Filled 2021-02-10: qty 1

## 2021-02-10 MED ORDER — SODIUM CHLORIDE 0.9 % IV SOLN
8.0000 mg | Freq: Four times a day (QID) | INTRAVENOUS | Status: DC | PRN
  Filled 2021-02-10: qty 4

## 2021-02-10 MED ORDER — FENTANYL CITRATE (PF) 250 MCG/5ML IJ SOLN
INTRAMUSCULAR | Status: AC
  Filled 2021-02-10: qty 5

## 2021-02-10 MED ORDER — PHENYLEPHRINE HCL-NACL 20-0.9 MG/250ML-% IV SOLN
INTRAVENOUS | Status: DC | PRN
  Administered 2021-02-10: 20 ug/min via INTRAVENOUS

## 2021-02-10 MED ORDER — PHENOL 1.4 % MT LIQD: 2.0000 | OROMUCOSAL | Status: DC | PRN

## 2021-02-10 MED ORDER — LIP MEDEX EX OINT
1.0000 | TOPICAL_OINTMENT | Freq: Two times a day (BID) | CUTANEOUS | Status: DC
Start: 2021-02-10 — End: 2021-02-24
  Administered 2021-02-11 – 2021-02-24 (×25): 1 via TOPICAL
  Filled 2021-02-10 (×5): qty 7

## 2021-02-10 MED ORDER — LACTATED RINGERS IV BOLUS
1000.0000 mL | Freq: Once | INTRAVENOUS | Status: AC
  Administered 2021-02-10: 1000 mL via INTRAVENOUS

## 2021-02-10 MED ORDER — PHENYLEPHRINE HCL (PRESSORS) 10 MG/ML IV SOLN
INTRAVENOUS | Status: DC | PRN
  Administered 2021-02-10 (×3): 80 ug via INTRAVENOUS

## 2021-02-10 MED ORDER — PHENYLEPHRINE HCL (PRESSORS) 10 MG/ML IV SOLN
INTRAVENOUS | Status: AC
  Filled 2021-02-10: qty 2

## 2021-02-10 MED ORDER — PROMETHAZINE HCL 25 MG/ML IJ SOLN: 6.2500 mg | INTRAMUSCULAR | Status: DC | PRN

## 2021-02-10 MED ORDER — CLINDAMYCIN PHOSPHATE 900 MG/50ML IV SOLN
900.0000 mg | Freq: Three times a day (TID) | INTRAVENOUS | Status: DC
  Administered 2021-02-10 – 2021-02-12 (×5): 900 mg via INTRAVENOUS
  Filled 2021-02-10 (×5): qty 50

## 2021-02-10 MED ORDER — BISMUTH SUBSALICYLATE 262 MG/15ML PO SUSP: 30.0000 mL | Freq: Three times a day (TID) | ORAL | Status: DC | PRN

## 2021-02-10 MED ORDER — PIPERACILLIN-TAZOBACTAM 3.375 G IVPB
INTRAVENOUS | Status: AC
  Filled 2021-02-10: qty 50

## 2021-02-10 MED ORDER — OXYCODONE HCL 5 MG/5ML PO SOLN: 5.0000 mg | Freq: Once | ORAL | Status: DC | PRN

## 2021-02-10 MED ORDER — MENTHOL 3 MG MT LOZG: 1.0000 | LOZENGE | OROMUCOSAL | Status: DC | PRN

## 2021-02-10 MED ORDER — ONDANSETRON HCL 4 MG/2ML IJ SOLN: 4.0000 mg | Freq: Four times a day (QID) | INTRAMUSCULAR | Status: DC | PRN

## 2021-02-10 MED ORDER — CLINDAMYCIN PHOSPHATE 600 MG/50ML IV SOLN
600.0000 mg | Freq: Once | INTRAVENOUS | Status: AC
  Administered 2021-02-10: 600 mg via INTRAVENOUS
  Filled 2021-02-10: qty 50

## 2021-02-10 MED ORDER — ONDANSETRON HCL 4 MG/2ML IJ SOLN
INTRAMUSCULAR | Status: DC | PRN
  Administered 2021-02-10: 4 mg via INTRAVENOUS

## 2021-02-10 MED ORDER — METOPROLOL TARTRATE 5 MG/5ML IV SOLN: 5.0000 mg | Freq: Four times a day (QID) | INTRAVENOUS | Status: DC | PRN

## 2021-02-10 MED ORDER — HYDROMORPHONE HCL 1 MG/ML IJ SOLN: 0.2500 mg | INTRAMUSCULAR | Status: DC | PRN

## 2021-02-10 MED ORDER — ACETAMINOPHEN 500 MG PO TABS
1000.0000 mg | ORAL_TABLET | Freq: Four times a day (QID) | ORAL | Status: DC
  Administered 2021-02-11 – 2021-02-24 (×36): 1000 mg via ORAL
  Filled 2021-02-10 (×45): qty 2

## 2021-02-10 MED ORDER — LIDOCAINE 2% (20 MG/ML) 5 ML SYRINGE
INTRAMUSCULAR | Status: DC | PRN
  Administered 2021-02-10: 40 mg via INTRAVENOUS

## 2021-02-10 MED ORDER — CHLORHEXIDINE GLUCONATE CLOTH 2 % EX PADS: 6.0000 | MEDICATED_PAD | Freq: Once | CUTANEOUS | Status: DC

## 2021-02-10 MED ORDER — CHLORHEXIDINE GLUCONATE 0.12 % MT SOLN
15.0000 mL | Freq: Once | OROMUCOSAL | Status: AC
  Administered 2021-02-10: 15 mL via OROMUCOSAL

## 2021-02-10 MED ORDER — MIDAZOLAM HCL 5 MG/5ML IJ SOLN
INTRAMUSCULAR | Status: DC | PRN
  Administered 2021-02-10: 2 mg via INTRAVENOUS

## 2021-02-10 MED ORDER — THIAMINE HCL 100 MG PO TABS
100.0000 mg | ORAL_TABLET | Freq: Every day | ORAL | Status: DC
  Administered 2021-02-11 – 2021-02-24 (×13): 100 mg via ORAL
  Filled 2021-02-10 (×14): qty 1

## 2021-02-10 MED ORDER — PROPOFOL 10 MG/ML IV BOLUS
INTRAVENOUS | Status: AC
  Filled 2021-02-10: qty 20

## 2021-02-10 MED ORDER — MAGIC MOUTHWASH
15.0000 mL | Freq: Four times a day (QID) | ORAL | Status: DC | PRN
  Filled 2021-02-10: qty 15

## 2021-02-10 MED ORDER — MIDAZOLAM HCL 2 MG/2ML IJ SOLN
INTRAMUSCULAR | Status: AC
  Filled 2021-02-10: qty 2

## 2021-02-10 MED ORDER — PROPOFOL 10 MG/ML IV BOLUS
INTRAVENOUS | Status: DC | PRN
  Administered 2021-02-10: 200 mg via INTRAVENOUS

## 2021-02-10 MED ORDER — PIPERACILLIN-TAZOBACTAM 3.375 G IVPB
3.3750 g | Freq: Three times a day (TID) | INTRAVENOUS | Status: DC
Start: 2021-02-10 — End: 2021-02-10

## 2021-02-10 MED ORDER — METHOCARBAMOL 1000 MG/10ML IJ SOLN
1000.0000 mg | Freq: Four times a day (QID) | INTRAVENOUS | Status: DC | PRN
  Filled 2021-02-10: qty 10

## 2021-02-10 MED ORDER — LACTATED RINGERS IV BOLUS (SEPSIS)
1500.0000 mL | Freq: Once | INTRAVENOUS | Status: AC
  Administered 2021-02-10: 1500 mL via INTRAVENOUS

## 2021-02-10 MED ORDER — SODIUM CHLORIDE 0.9 % IV SOLN
2.0000 g | Freq: Once | INTRAVENOUS | Status: AC
  Administered 2021-02-10: 2 g via INTRAVENOUS
  Filled 2021-02-10: qty 2

## 2021-02-10 MED ORDER — CALCIUM POLYCARBOPHIL 625 MG PO TABS
625.0000 mg | ORAL_TABLET | Freq: Two times a day (BID) | ORAL | Status: DC
Start: 2021-02-10 — End: 2021-02-24
  Administered 2021-02-11 – 2021-02-22 (×14): 625 mg via ORAL
  Filled 2021-02-10 (×28): qty 1

## 2021-02-10 MED ORDER — LABETALOL HCL 5 MG/ML IV SOLN: 5.0000 mg | INTRAVENOUS | Status: DC | PRN

## 2021-02-10 MED ORDER — ENSURE SURGERY PO LIQD
237.0000 mL | Freq: Two times a day (BID) | ORAL | Status: DC
Start: 2021-02-10 — End: 2021-02-12
  Filled 2021-02-10 (×4): qty 237

## 2021-02-10 MED ORDER — METHOCARBAMOL 500 MG PO TABS: 1000.0000 mg | ORAL_TABLET | Freq: Four times a day (QID) | ORAL | Status: DC | PRN

## 2021-02-10 MED ORDER — 0.9 % SODIUM CHLORIDE (POUR BTL) OPTIME
TOPICAL | Status: DC | PRN
  Administered 2021-02-10: 1000 mL

## 2021-02-10 MED ORDER — DEXAMETHASONE SODIUM PHOSPHATE 10 MG/ML IJ SOLN
INTRAMUSCULAR | Status: DC | PRN
  Administered 2021-02-10: 10 mg via INTRAVENOUS

## 2021-02-10 MED ORDER — CELECOXIB 200 MG PO CAPS
200.0000 mg | ORAL_CAPSULE | ORAL | Status: AC
  Administered 2021-02-10: 200 mg via ORAL

## 2021-02-10 MED ORDER — VANCOMYCIN HCL 1250 MG/250ML IV SOLN
1250.0000 mg | Freq: Two times a day (BID) | INTRAVENOUS | Status: DC
  Administered 2021-02-11 – 2021-02-12 (×3): 1250 mg via INTRAVENOUS
  Filled 2021-02-10 (×4): qty 250

## 2021-02-10 MED ORDER — DIPHENHYDRAMINE HCL 50 MG/ML IJ SOLN: 12.5000 mg | Freq: Four times a day (QID) | INTRAMUSCULAR | Status: DC | PRN

## 2021-02-10 MED ORDER — SODIUM CHLORIDE 0.9 % IR SOLN
Status: DC | PRN
  Administered 2021-02-10: 3000 mL

## 2021-02-10 MED ORDER — LACTATED RINGERS IV SOLN: INTRAVENOUS | Status: AC

## 2021-02-10 MED ORDER — GABAPENTIN 300 MG PO CAPS
300.0000 mg | ORAL_CAPSULE | ORAL | Status: AC
  Administered 2021-02-10: 300 mg via ORAL

## 2021-02-10 MED ORDER — HYDROMORPHONE HCL 1 MG/ML IJ SOLN: 0.5000 mg | INTRAMUSCULAR | Status: DC | PRN

## 2021-02-10 MED ORDER — OXYCODONE HCL 5 MG PO TABS: 5.0000 mg | ORAL_TABLET | Freq: Once | ORAL | Status: DC | PRN

## 2021-02-10 MED ORDER — PIPERACILLIN-TAZOBACTAM 3.375 G IVPB
3.3750 g | Freq: Three times a day (TID) | INTRAVENOUS | Status: DC
  Administered 2021-02-10: 3.375 g via INTRAVENOUS

## 2021-02-10 SURGICAL SUPPLY — 16 items
BAG COUNTER SPONGE SURGICOUNT (BAG) IMPLANT
BAG SPNG CNTER NS LX DISP (BAG)
COVER SURGICAL LIGHT HANDLE (MISCELLANEOUS) ×2 IMPLANT
DRSG PAD ABDOMINAL 8X10 ST (GAUZE/BANDAGES/DRESSINGS) ×1 IMPLANT
GAUZE SPONGE 4X4 12PLY STRL (GAUZE/BANDAGES/DRESSINGS) ×1 IMPLANT
GLOVE SURG ENC MOIS LTX SZ6 (GLOVE) ×2 IMPLANT
GLOVE SURG UNDER LTX SZ6.5 (GLOVE) ×2 IMPLANT
GLOVE SURG UNDER POLY LF SZ6.5 (GLOVE) ×2 IMPLANT
GLOVE SURG UNDER POLY LF SZ7 (GLOVE) ×2 IMPLANT
GOWN STRL REUS W/ TWL XL LVL3 (GOWN DISPOSABLE) ×3 IMPLANT
GOWN STRL REUS W/TWL 2XL LVL3 (GOWN DISPOSABLE) ×2 IMPLANT
GOWN STRL REUS W/TWL XL LVL3 (GOWN DISPOSABLE) ×6
KIT TURNOVER KIT A (KITS) ×2 IMPLANT
PENCIL SMOKE EVACUATOR (MISCELLANEOUS) ×1 IMPLANT
SPONGE T-LAP 18X18 ~~LOC~~+RFID (SPONGE) ×1 IMPLANT
TOWEL OR 17X26 10 PK STRL BLUE (TOWEL DISPOSABLE) ×2 IMPLANT

## 2021-02-10 NOTE — Anesthesia Procedure Notes (Signed)
Procedure Name: LMA Insertion Date/Time: 02/10/2021 6:59 PM Performed by: Ponciano Ort, CRNA Pre-anesthesia Checklist: Patient identified, Emergency Drugs available, Suction available and Patient being monitored Patient Re-evaluated:Patient Re-evaluated prior to induction Oxygen Delivery Method: Circle system utilized Preoxygenation: Pre-oxygenation with 100% oxygen Induction Type: IV induction LMA: LMA inserted LMA Size: 5.0 Number of attempts: 1 Placement Confirmation: positive ETCO2 and breath sounds checked- equal and bilateral Tube secured with: Tape Dental Injury: Teeth and Oropharynx as per pre-operative assessment

## 2021-02-10 NOTE — ED Triage Notes (Signed)
Pt arrived via POV, states he has several bedsores on buttocks, believes one is infected. Endorses intermittent fevers, and drainage from area.

## 2021-02-10 NOTE — Transfer of Care (Signed)
Immediate Anesthesia Transfer of Care Note  Patient: John Figueroa  Procedure(s) Performed: IRRIGATION AND DEBRIDEMENT ABSCESS (Left: Buttocks)  Patient Location: PACU  Anesthesia Type:General  Level of Consciousness: awake, alert , oriented and patient cooperative  Airway & Oxygen Therapy: Patient Spontanous Breathing and Patient connected to face mask oxygen  Post-op Assessment: Report given to RN and Post -op Vital signs reviewed and stable  Post vital signs: Reviewed and stable  Last Vitals:  Vitals Value Taken Time  BP 93/61 02/10/21 2005  Temp    Pulse 83 02/10/21 2008  Resp 13 02/10/21 2008  SpO2 100 % 02/10/21 2008  Vitals shown include unvalidated device data.  Last Pain:  Vitals:   02/10/21 1726  TempSrc: Oral  PainSc: 0-No pain         Complications: No notable events documented.
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
## 2021-02-10 NOTE — Telephone Encounter (Signed)
Patient mother states he has a bed sore. Spoke to team health and they was advised to be seen by PCP within 4 hours. Offered an appointment with a provider in our office but she refused. Preferred to go to ED instead.

## 2021-02-10 NOTE — Progress Notes (Deleted)
A consult was received from an ED physician for Vancomycin & Cefepime per pharmacy dosing.  The patient's profile has been reviewed for ht/wt/allergies/indication/available labs.    A one time order has been placed for Vancomycin 2gm, Cefepime 2gm per MD.  Further antibiotics/pharmacy consults should be ordered by admitting physician if indicated.                       Thank you,  Otho Bellows PharmD 02/10/2021  12:28 PM

## 2021-02-10 NOTE — Anesthesia Postprocedure Evaluation (Signed)
Anesthesia Post Note  Patient: John Figueroa  Procedure(s) Performed: IRRIGATION AND DEBRIDEMENT ABSCESS (Left: Buttocks)     Patient location during evaluation: PACU Anesthesia Type: General Level of consciousness: awake and alert Pain management: pain level controlled Vital Signs Assessment: post-procedure vital signs reviewed and stable Respiratory status: spontaneous breathing, nonlabored ventilation and respiratory function stable Cardiovascular status: blood pressure returned to baseline and stable Postop Assessment: no apparent nausea or vomiting Anesthetic complications: no   No notable events documented.  Last Vitals:  Vitals:   02/10/21 2006 02/10/21 2015  BP: 93/61 98/62  Pulse: 83 79  Resp: 16 17  Temp: 36.7 C   SpO2: 100% 100%    Last Pain:  Vitals:   02/10/21 2015  TempSrc:   PainSc: 0-No pain                 Lowella Curb

## 2021-02-10 NOTE — Progress Notes (Signed)
Elink following for code sepsis 

## 2021-02-10 NOTE — Progress Notes (Signed)
To OR, sepsis monitoring completed

## 2021-02-10 NOTE — ED Notes (Signed)
Unable to insert foley catheter. Pt was in and out cath with sample sent.

## 2021-02-10 NOTE — Anesthesia Preprocedure Evaluation (Signed)
Anesthesia Evaluation  Patient identified by MRN, date of birth, ID band Patient awake    Reviewed: Allergy & Precautions, NPO status , Patient's Chart, lab work & pertinent test results  Airway Mallampati: II  TM Distance: >3 FB Neck ROM: Full    Dental no notable dental hx.    Pulmonary neg pulmonary ROS,    Pulmonary exam normal breath sounds clear to auscultation       Cardiovascular hypertension, negative cardio ROS Normal cardiovascular exam Rhythm:Regular Rate:Normal     Neuro/Psych  Headaches, Anxiety Depression negative psych ROS   GI/Hepatic Neg liver ROS, GERD  ,  Endo/Other  negative endocrine ROS  Renal/GU Renal InsufficiencyRenal disease  negative genitourinary   Musculoskeletal negative musculoskeletal ROS (+)   Abdominal   Peds negative pediatric ROS (+)  Hematology negative hematology ROS (+)   Anesthesia Other Findings Paraplegic Necrotizing Fascitis  Reproductive/Obstetrics negative OB ROS                             Anesthesia Physical Anesthesia Plan  ASA: 3 and emergent  Anesthesia Plan: General   Post-op Pain Management:    Induction: Intravenous  PONV Risk Score and Plan: 2 and Ondansetron and Midazolam  Airway Management Planned: Oral ETT  Additional Equipment:   Intra-op Plan:   Post-operative Plan: Extubation in OR  Informed Consent: I have reviewed the patients History and Physical, chart, labs and discussed the procedure including the risks, benefits and alternatives for the proposed anesthesia with the patient or authorized representative who has indicated his/her understanding and acceptance.     Dental advisory given  Plan Discussed with: CRNA  Anesthesia Plan Comments:         Anesthesia Quick Evaluation

## 2021-02-10 NOTE — ED Provider Notes (Signed)
Emergency Medicine Provider Triage Evaluation Note  John Figueroa , a 40 y.o. male  was evaluated in triage.  Pt complains of infected sore x 2 weeks. With purulent drainage, fevers, chills. Sore is beneath his buttocks. Patient is paraplegic, no h/o diabetes but h/o alcohol abuse   Review of Systems  Positive: Fever, chills, infected wound Negative: Chest pain, SOB  Physical Exam  BP 138/74 (BP Location: Left Arm)   Pulse (!) 105   Temp 99.1 F (37.3 C) (Oral)   Resp 18   SpO2 100%  Gen:   Awake, no distress   Resp:  Normal effort  MSK:   Moves extremities without difficulty  Other:  Tachycardic  Medical Decision Making  Medically screening exam initiated at 10:58 AM.  Appropriate orders placed.  HULAN SZUMSKI was informed that the remainder of the evaluation will be completed by another provider, this initial triage assessment does not replace that evaluation, and the importance of remaining in the ED until their evaluation is complete.  Temp 99.1 orally, will need rectal temp. Concern for possible sepsis given tachycardic with wound infection.    Theron Arista, PA-C 02/10/21 1100    [place], [place], MD 02/10/21 2038

## 2021-02-10 NOTE — Progress Notes (Signed)
Blood cultures drawn before antibiotics given per ED RN 
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
## 2021-02-10 NOTE — Progress Notes (Signed)
A consult was received from an ED physician for Vancomycin & Cefepime per pharmacy dosing.  The patient's profile has been reviewed for ht/wt/allergies/indication/available labs.    A one time order has been placed for Vancomycin 2gm, Cefepime 2gm per MD.  Further antibiotics/pharmacy consults should be ordered by admitting physician if indicated.                       Thank you,  Otho Bellows PharmD 02/10/2021  12:35 PM

## 2021-02-10 NOTE — Op Note (Signed)
Incision and Drainage, irrigation and debridement left buttock   Pre-operative Diagnosis: Fournier's gangrene  Post-operative Diagnosis: necrotizing fasciitis of the left buttock  Indications: paraplegic patient with fever and new buttock/perineal wound   Anesthesia: General   Procedure Details  The procedure, risks and complications have been discussed in detail (including, infection, bleeding, need for additional procedures) with the patient, and the patient has signed consent to the procedure. The patient was informed that the wound would be left open.   The patient was taken to OR 1 and general anesthesia was induced. The patient was placed into right lateral decubitus position.  The skin was sterilely prepped and draped over the affected area in the usual fashion. Time out was performed according to the surgical safety checklist.   The necrotic skin was identified and this was excised with a scalpel.  The tissue laterally was necrotic underneath.  Additional tissue was debrided sharply with a scalpel.  Altogether 11.5 x 10.5 x 2 cm necrotic skin, soft tissue, fascia, and muscle were debrided sharply with a scalpel.  Cultures were sent. Foul smelling murky drainage was present. The pulse lavage was used to irrigate the cavity. The wound was packed with betadine soaked kerlix.  The patient was awakened from anesthesia and taken to the PACU in stable condition.   Findings:  Necrotic skin, soft tissue, fascia and muscle of the left buttock  EBL: 50 mLs  Drains: None   Condition: Tolerated procedure well   Complications:  none known.
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
## 2021-02-10 NOTE — Progress Notes (Signed)
Pharmacy Antibiotic Note  John Figueroa is a 40 y.o. paraplegic male admitted on 02/10/2021 with necrotizing fasciitis of L buttock.  Pharmacy has been consulted for vancomycin dosing.  Plan: Vancomycin 2000 mg IV given x 1, then 1250 mg IV q12 hr (est AUC 422 based on SCr 1.2; Vd 0.72) - AUC on lower side, but CrCl is likely an overestimate given immobility/paraplegia; using SCr an empirically adjusted SCr of 1.5 gives AUC ~ 500, which is acceptable Measure vancomycin AUC at steady state as indicated SCr q48 while on vanc Clindamycin 900 mg IV q8 hr and Cefepime 2g IV q8 hr per MD; dosing appropriate (anticipate 2-3 days of Clinda; will need to add back anaerobic coverage once clindamycin course is completed)   Height: 6\' 6"  (198.1 cm) Weight: 90.7 kg (200 lb) IBW/kg (Calculated) : 91.4  Temp (24hrs), Avg:99.6 F (37.6 C), Min:98.1 F (36.7 C), Max:102.6 F (39.2 C)  Recent Labs  Lab 02/10/21 1208 02/10/21 1216  WBC 23.9*  --   CREATININE 1.26* 1.20  LATICACIDVEN 1.5  --     Estimated Creatinine Clearance: 102.9 mL/min (by C-G formula based on SCr of 1.2 mg/dL).    No Known Allergies  Antimicrobials this admission: 8/16 vancomycin >>  8/16 cefepime >>  8/16 clindamycin >>  Dose adjustments this admission: N/a  Microbiology results: 8/16 BCx: sent 8/16 UCx: sent  8/16 abscess Cx (from OR): sent  Thank you for allowing pharmacy to be a part of this patient's care.  Tobyn Osgood A 02/10/2021 9:27 PM

## 2021-02-11 ENCOUNTER — Encounter (HOSPITAL_COMMUNITY): Payer: Self-pay | Admitting: General Surgery

## 2021-02-11 ENCOUNTER — Other Ambulatory Visit: Payer: Self-pay

## 2021-02-11 DIAGNOSIS — L8990 Pressure ulcer of unspecified site, unspecified stage: Secondary | ICD-10-CM | POA: Insufficient documentation

## 2021-02-11 DIAGNOSIS — M726 Necrotizing fasciitis: Secondary | ICD-10-CM | POA: Diagnosis not present

## 2021-02-11 DIAGNOSIS — D649 Anemia, unspecified: Secondary | ICD-10-CM | POA: Diagnosis not present

## 2021-02-11 DIAGNOSIS — E871 Hypo-osmolality and hyponatremia: Secondary | ICD-10-CM

## 2021-02-11 LAB — COMPREHENSIVE METABOLIC PANEL
ALT: 15 U/L (ref 0–44)
AST: 18 U/L (ref 15–41)
Albumin: 2.4 g/dL — ABNORMAL LOW (ref 3.5–5.0)
Alkaline Phosphatase: 51 U/L (ref 38–126)
Anion gap: 11 (ref 5–15)
BUN: 15 mg/dL (ref 6–20)
CO2: 22 mmol/L (ref 22–32)
Calcium: 8.1 mg/dL — ABNORMAL LOW (ref 8.9–10.3)
Chloride: 98 mmol/L (ref 98–111)
Creatinine, Ser: 0.99 mg/dL (ref 0.61–1.24)
GFR, Estimated: >60 mL/min (ref >60)
Glucose, Bld: 139 mg/dL — ABNORMAL HIGH (ref 70–99)
Potassium: 3.5 mmol/L (ref 3.5–5.1)
Sodium: 131 mmol/L — ABNORMAL LOW (ref 135–145)
Total Bilirubin: 1.1 mg/dL (ref 0.3–1.2)
Total Protein: 6.5 g/dL (ref 6.5–8.1)

## 2021-02-11 LAB — CBC WITH DIFFERENTIAL/PLATELET
Abs Immature Granulocytes: 1.94 10*3/uL — ABNORMAL HIGH (ref 0.00–0.07)
Basophils Absolute: 0.1 10*3/uL (ref 0.0–0.1)
Basophils Relative: 0 %
Eosinophils Absolute: 0 10*3/uL (ref 0.0–0.5)
Eosinophils Relative: 0 %
HCT: 29 % — ABNORMAL LOW (ref 39.0–52.0)
Hemoglobin: 9.4 g/dL — ABNORMAL LOW (ref 13.0–17.0)
Immature Granulocytes: 7 %
Lymphocytes Relative: 3 %
Lymphs Abs: 0.7 10*3/uL (ref 0.7–4.0)
MCH: 30.1 pg (ref 26.0–34.0)
MCHC: 32.4 g/dL (ref 30.0–36.0)
MCV: 92.9 fL (ref 80.0–100.0)
Monocytes Absolute: 2.3 10*3/uL — ABNORMAL HIGH (ref 0.1–1.0)
Monocytes Relative: 8 %
Neutro Abs: 23 10*3/uL — ABNORMAL HIGH (ref 1.7–7.7)
Neutrophils Relative %: 82 %
Platelets: 430 10*3/uL — ABNORMAL HIGH (ref 150–400)
RBC: 3.12 MIL/uL — ABNORMAL LOW (ref 4.22–5.81)
RDW: 16.6 % — ABNORMAL HIGH (ref 11.5–15.5)
WBC: 28 10*3/uL — ABNORMAL HIGH (ref 4.0–10.5)
nRBC: 0 % (ref 0.0–0.2)

## 2021-02-11 LAB — LACTIC ACID, PLASMA: Lactic Acid, Venous: 1.3 mmol/L (ref 0.5–1.9)

## 2021-02-11 LAB — CBC
HCT: 27.2 % — ABNORMAL LOW (ref 39.0–52.0)
Hemoglobin: 8.9 g/dL — ABNORMAL LOW (ref 13.0–17.0)
MCH: 29.8 pg (ref 26.0–34.0)
MCHC: 32.7 g/dL (ref 30.0–36.0)
MCV: 91 fL (ref 80.0–100.0)
Platelets: 497 10*3/uL — ABNORMAL HIGH (ref 150–400)
RBC: 2.99 MIL/uL — ABNORMAL LOW (ref 4.22–5.81)
RDW: 16.7 % — ABNORMAL HIGH (ref 11.5–15.5)
WBC: 29.9 10*3/uL — ABNORMAL HIGH (ref 4.0–10.5)
nRBC: 0 % (ref 0.0–0.2)

## 2021-02-11 LAB — HIV ANTIBODY (ROUTINE TESTING W REFLEX): HIV Screen 4th Generation wRfx: NONREACTIVE

## 2021-02-11 LAB — URINE CULTURE: Culture: NO GROWTH

## 2021-02-11 MED ORDER — MUPIROCIN CALCIUM 2 % EX CREA
TOPICAL_CREAM | Freq: Every day | CUTANEOUS | Status: DC
  Administered 2021-02-21: 1 via TOPICAL
  Filled 2021-02-11 (×2): qty 15

## 2021-02-11 MED ORDER — LORAZEPAM 1 MG PO TABS
1.0000 mg | ORAL_TABLET | ORAL | Status: AC | PRN
  Administered 2021-02-12 – 2021-02-13 (×4): 1 mg via ORAL
  Administered 2021-02-14: 2 mg via ORAL
  Filled 2021-02-11 (×2): qty 1
  Filled 2021-02-11: qty 2
  Filled 2021-02-11 (×2): qty 1

## 2021-02-11 MED ORDER — LORAZEPAM 2 MG/ML IJ SOLN: 1.0000 mg | INTRAMUSCULAR | Status: AC | PRN

## 2021-02-11 MED ORDER — FOLIC ACID 1 MG PO TABS
1.0000 mg | ORAL_TABLET | Freq: Every day | ORAL | Status: DC
  Administered 2021-02-11 – 2021-02-24 (×13): 1 mg via ORAL
  Filled 2021-02-11 (×13): qty 1

## 2021-02-11 NOTE — Consult Note (Addendum)
WOC Nurse Consult Note: Surgical team is following for assessment and plan of care for full thickness post-op buttocks wound; pt had surgery performed to this location yesterday.  Please refer to their team for further questions regarding topical treatment and plan of care for this location.  Reason for Consult: Consult requested for BLE wounds.  Assessed legs and skin is intact.  Multiple pressure injuries and full thickness wounds to bilat feet.  Pressure Injury POA: Yes Left foot: Heel with darker colored intact skin; Deep tissue pressure (DTPI) injury1X1cm.  DTPI with same appearance to middle foot: .3X.3cm and .3X1cm  DTPI with same appearance to left lower foot near great toe; .5X.5cm Left great toe with full thickness wound with dry scabbed skin; .5X.5cm Left anterior 4th toe with full thickness wound with dry scabbed skin; .3X.3cm Left middle foot with Unstageable; 3X3cm, Right heel with yellow fluid filled blister with skin beginning to peel; Stage 2 pressure injury 8X7X.2cm, yellow woundbed with mod amt yellow drainage Right anterior toes 1,3.4.5th with full thickness wound with dry scabbed skin; .5X.5cm Right heel and plantar foot with with darker colored intact skin; Deep tissue pressure (DTPI) injuries; .3X.3cm, 4X1cm, .5X.5cm   Dressing procedure/placement/frequency: No topical treatment is indicated for deep tissue pressure injuries with intact skin, but they are high risk to evolve into full thickness tissue loss.  Reduce pressure to sites. Topical treatment orders provided for bedside nurses to perform to promote moist healing as follows: Float heels to reduce pressure. Bactroban to Left middle foot, left great toe, left 4th toe, right heel, and right toe wounds Q day, then cover with foam dressings.  (Change foam dressings Q 3 days or PRN soiling.) Please re-consult if further assistance is needed.  Thank-you,  Cammie Mcgee MSN, RN, CWOCN, [place], CNS [phone]
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
## 2021-02-11 NOTE — Progress Notes (Addendum)
02/11/21 1557 Hydrotherapy Evaluation  Subjective Assessment  Subjective Let me know what to do to help you.  Patient and Family Stated Goals to get a new WC and cushion  Date of Onset  (present on adm. S/P debridement 02/10/21)  Prior Treatments I treated it myself, my primary doctor  Evaluation and Treatment  Evaluation and Treatment Procedures Explained to Patient/Family Yes  Evaluation and Treatment Procedures agreed to  Pressure Injury 02/11/21 Flank Left Stage 3 -  Full thickness tissue loss. Subcutaneous fat may be visible but bone, tendon or muscle are NOT exposed. large open wound on left buttock( post surgical debridement).PT ONLY  Date First Assessed/Time First Assessed: 02/11/21 1510   Location: Flank  Location Orientation: Left  Staging: Stage 3 -  Full thickness tissue loss. Subcutaneous fat may be visible but bone, tendon or muscle are NOT exposed.  Wound Description (Comme...  Wound Image  (see surgery note 8/17.)  Dressing Type ABD;Gauze (Comment);Paper tape;Normal saline moist dressing  Dressing Changed  Dressing Change Frequency Daily  Site / Wound Assessment Clean;Dry  % Wound base Red or Granulating 50%  % Wound base Yellow/Fibrinous Exudate 50%  Peri-wound Assessment Intact (right ischial wound present.)  Wound Length (cm) 11 cm  Wound Width (cm) 9 cm  Wound Depth (cm) 2 cm  Wound Surface Area (cm^2) 99 cm^2  Wound Volume (cm^3) 198 cm^3  Undermining (cm) 1.5 (from 7-11:00)  Margins Unattached edges (unapproximated)  Drainage Amount Moderate  Drainage Description Serosanguineous;Odor  Treatment Debridement (Selective);Hydrotherapy (Pulse lavage);Packing (Saline gauze)  Hydrotherapy  Pulsed Lavage with Suction (psi) 12 psi  Pulsed Lavage with Suction - Normal Saline Used 1000 mL  Pulsed Lavage Tip Tip with splash shield  Pulsed lavage therapy - wound location left flank/buttock near gluteal cleft  Selective Debridement  Selective Debridement - Location  buttock  Selective Debridement - Tools Used Forceps;Scissors (4x4 rubbed around insside undermining)  Selective Debridement - Tissue Removed nonviable , black,  Wound Therapy - Assess/Plan/Recommendations  Wound Therapy - Clinical Statement Pleasant  40 YO, paraplegia at L1 with  large open  left gluteal/flank wound, S/P I and D on 02/10/21.. Patient has multiple various wounds on feet and right ischium. PT order for hydrotherapy to Left gluteal wound.. Patient tolerated well. Patient repositions self in bed well. Patient reports waiting for evaluation for new WC and cushion. Patient will benefit from Hydrotherpy to improve wound qualities and decrease bioburden effects. Patient reports has B/B regimen that he performs independently.Patient may return for further surgical debridement on 02/12/2021.  Wound Therapy - Functional Problem List L1 paraplegia, impaired sensation  Factors Delaying/Impairing Wound Healing Altered sensation;Diabetes Mellitus;Immobility;Infection - systemic/local  Hydrotherapy Plan Debridement;Dressing change;Patient/family education;Pulsatile lavage with suction  Wound Therapy - Frequency 6X / week  Wound Therapy - Current Recommendations PT  Wound Therapy - Follow Up Recommendations dressing changes by RN;dressing changes by family/patient  Wound Therapy Goals - Improve the function of patient's integumentary system by progressing the wound(s) through the phases of wound healing by:  Decrease Necrotic Tissue to 25  Decrease Necrotic Tissue - Progress Goal set today  Increase Granulation Tissue to 75  Increase Granulation Tissue - Progress Goal set today  Improve Drainage Characteristics Min  Improve Drainage Characteristics - Progress Goal set today  Patient/Family will be able to  reposition, perform dressing change  Patient/Family Instruction Goal - Progress Goal set today  Goals/treatment plan/discharge plan were made with and agreed upon by patient/family Yes  Time  For Goal  Achievement 2 weeks  Wound Therapy - Potential for Goals Good  Blanchard Kelch PT Acute Rehabilitation Services Pager [phone] Office [phone] Eval-low, deb< 20cm2 [phone] supply gun/tip

## 2021-02-11 NOTE — Plan of Care (Signed)
  Problem: Education: Goal: Knowledge of General Education information will improve Description Including pain rating scale, medication(s)/side effects and non-pharmacologic comfort measures Outcome: Progressing   

## 2021-02-11 NOTE — Progress Notes (Signed)
OT Cancellation Note  Patient Details Name: John Figueroa MRN: [id number] DOB: 1980-11-10   Cancelled Treatment:    Reason Eval/Treat Not Completed: OT screened, no needs identified, will sign off.  Patient reports no therapy needs. Reports this morning he transferred to wc and self cathed in bathroom. Reports no weakness. Patient at baseline.  [name] 02/12/2021, 9:42 AM
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
## 2021-02-12 DIAGNOSIS — E871 Hypo-osmolality and hyponatremia: Secondary | ICD-10-CM | POA: Diagnosis not present

## 2021-02-12 DIAGNOSIS — A419 Sepsis, unspecified organism: Secondary | ICD-10-CM | POA: Diagnosis not present

## 2021-02-12 DIAGNOSIS — M726 Necrotizing fasciitis: Secondary | ICD-10-CM | POA: Diagnosis not present

## 2021-02-12 DIAGNOSIS — D649 Anemia, unspecified: Secondary | ICD-10-CM | POA: Diagnosis not present

## 2021-02-12 DIAGNOSIS — F32A Depression, unspecified: Secondary | ICD-10-CM | POA: Diagnosis not present

## 2021-02-12 LAB — BASIC METABOLIC PANEL
Anion gap: 8 (ref 5–15)
BUN: 18 mg/dL (ref 6–20)
CO2: 26 mmol/L (ref 22–32)
Calcium: 8 mg/dL — ABNORMAL LOW (ref 8.9–10.3)
Chloride: 99 mmol/L (ref 98–111)
Creatinine, Ser: 0.97 mg/dL (ref 0.61–1.24)
GFR, Estimated: >60 mL/min (ref >60)
Glucose, Bld: 126 mg/dL — ABNORMAL HIGH (ref 70–99)
Potassium: 3.1 mmol/L — ABNORMAL LOW (ref 3.5–5.1)
Sodium: 133 mmol/L — ABNORMAL LOW (ref 135–145)

## 2021-02-12 LAB — CBC
HCT: 26.7 % — ABNORMAL LOW (ref 39.0–52.0)
Hemoglobin: 8.6 g/dL — ABNORMAL LOW (ref 13.0–17.0)
MCH: 29.4 pg (ref 26.0–34.0)
MCHC: 32.2 g/dL (ref 30.0–36.0)
MCV: 91.1 fL (ref 80.0–100.0)
Platelets: 553 10*3/uL — ABNORMAL HIGH (ref 150–400)
RBC: 2.93 MIL/uL — ABNORMAL LOW (ref 4.22–5.81)
RDW: 16.8 % — ABNORMAL HIGH (ref 11.5–15.5)
WBC: 26.1 10*3/uL — ABNORMAL HIGH (ref 4.0–10.5)
nRBC: 0 % (ref 0.0–0.2)

## 2021-02-12 LAB — MRSA NEXT GEN BY PCR, NASAL: MRSA by PCR Next Gen: DETECTED — AB

## 2021-02-12 LAB — SURGICAL PATHOLOGY

## 2021-02-12 MED ORDER — POTASSIUM CHLORIDE CRYS ER 20 MEQ PO TBCR
40.0000 meq | EXTENDED_RELEASE_TABLET | ORAL | Status: AC
Start: 2021-02-12 — End: 2021-02-12
  Administered 2021-02-12 (×2): 40 meq via ORAL
  Filled 2021-02-12 (×2): qty 2

## 2021-02-12 MED ORDER — JUVEN PO PACK
1.0000 | PACK | Freq: Two times a day (BID) | ORAL | Status: DC
  Administered 2021-02-13 – 2021-02-22 (×12): 1 via ORAL
  Filled 2021-02-12 (×25): qty 1

## 2021-02-12 MED ORDER — PROSOURCE PLUS PO LIQD
30.0000 mL | Freq: Two times a day (BID) | ORAL | Status: DC
  Administered 2021-02-12 – 2021-02-24 (×21): 30 mL via ORAL
  Filled 2021-02-12 (×20): qty 30

## 2021-02-12 MED ORDER — PIPERACILLIN-TAZOBACTAM 3.375 G IVPB
3.3750 g | Freq: Three times a day (TID) | INTRAVENOUS | Status: DC
  Administered 2021-02-12: 3.375 g via INTRAVENOUS
  Filled 2021-02-12: qty 50

## 2021-02-12 MED ORDER — LINEZOLID 600 MG PO TABS
600.0000 mg | ORAL_TABLET | Freq: Two times a day (BID) | ORAL | Status: DC
  Administered 2021-02-12 – 2021-02-24 (×23): 600 mg via ORAL
  Filled 2021-02-12 (×26): qty 1

## 2021-02-12 MED ORDER — DOXYCYCLINE HYCLATE 100 MG PO TABS: 100.0000 mg | ORAL_TABLET | Freq: Two times a day (BID) | ORAL | Status: DC

## 2021-02-12 MED ORDER — ENSURE ENLIVE PO LIQD: 237.0000 mL | Freq: Two times a day (BID) | ORAL | Status: DC

## 2021-02-12 MED ORDER — AMOXICILLIN-POT CLAVULANATE 875-125 MG PO TABS
1.0000 | ORAL_TABLET | Freq: Two times a day (BID) | ORAL | Status: DC
  Administered 2021-02-12 – 2021-02-24 (×23): 1 via ORAL
  Filled 2021-02-12 (×25): qty 1

## 2021-02-12 NOTE — Progress Notes (Addendum)
NUTRITION NOTE  RD unable to see patient in person today. Patient is on a Regular diet. Will order oral nutrition supplements as outlined below and will complete full assessment in person 8/19.  Will order Ensure Plus BID, each supplement provides 350 kcal and 13 grams of protein  Will order 1 packet Juven BID, each packet provides 95 calories, 2.5 grams of protein (collagen), and 9.8 grams of carbohydrate (3 grams sugar); also contains 7 grams of L-arginine and L-glutamine, 300 mg vitamin C, 15 mg vitamin E, 1.2 mcg vitamin B-12, 9.5 mg zinc, 200 mg calcium, and 1.5 g  Calcium Beta-hydroxy-Beta-methylbutyrate to support wound healing.  Will order 30 ml Prosource Plus BID, each supplement provides 100 kcal and 15 grams protein.    He has refused nearly all bottles of Ensure Surgery so will d/c this supplement.     Trenton Gammon, MS, RD, LDN, CNSC Inpatient Clinical Dietitian RD pager # available in AMION  After hours/weekend pager # available in [facility]

## 2021-02-12 NOTE — Progress Notes (Signed)
   02/12/21 1600  Hydrotherapy.  Subjective Assessment  Subjective I felt like was having a heart attach. I am depressed.  Patient and Family Stated Goals to get a new WC and cushion  Date of Onset  (present on adm. S/P debridement 02/10/21)  Prior Treatments I treated it myself, my primary doctor  Evaluation and Treatment  Evaluation and Treatment Procedures Explained to Patient/Family Yes  Evaluation and Treatment Procedures agreed to  Pressure Injury 02/11/21 Flank Left Stage 3 -  Full thickness tissue loss. Subcutaneous fat may be visible but bone, tendon or muscle are NOT exposed. large open wound on left buttock( post surgical debridement).PT ONLY  Date First Assessed/Time First Assessed: 02/11/21 1510   Location: Flank  Location Orientation: Left  Staging: Stage 3 -  Full thickness tissue loss. Subcutaneous fat may be visible but bone, tendon or muscle are NOT exposed.  Wound Description (Comme...  Dressing Type ABD;Moist to dry;Paper tape  Dressing Changed  Dressing Change Frequency Twice a day  Site / Wound Assessment Clean;Dry  % Wound base Red or Granulating 50%  % Wound base Yellow/Fibrinous Exudate 50%  Peri-wound Assessment Intact;Excoriated  Wound Length (cm) 11 cm  Wound Width (cm) 9 cm  Wound Depth (cm) 2 cm  Wound Surface Area (cm^2) 99 cm^2  Wound Volume (cm^3) 198 cm^3  Undermining (cm) 1.5  Margins Unattached edges (unapproximated)  Drainage Amount Moderate  Drainage Description Serosanguineous  Treatment Cleansed;Hydrotherapy (Pulse lavage);Debridement (Selective);Packing (Saline gauze)  Hydrotherapy  Pulsed Lavage with Suction (psi) 12 psi  Pulsed Lavage with Suction - Normal Saline Used 1000 mL  Pulsed Lavage Tip Tip with splash shield  Pulsed lavage therapy - wound location left flank/buttock near gluteal cleft  Selective Debridement  Selective Debridement - Location buttock  Selective Debridement - Tools Used Forceps;Scissors (4x4 rubbed around insside  undermining)  Selective Debridement - Tissue Removed nonviable , black,  Wound Therapy - Assess/Plan/Recommendations  Wound Therapy - Clinical Statement Patient stated that he needed his valium, 'I feel like I am having a heart attach. I am glad you came in to talk to me.. I am depressed.". RN notified of  patient's request for meds..Wound  has increased pink, necrotic muscle in center of wound. continue PT for hydrotherapy.  Wound Therapy - Functional Problem List L1 paraplegia, impaired sensation  Factors Delaying/Impairing Wound Healing Altered sensation;Diabetes Mellitus;Immobility;Infection - systemic/local  Hydrotherapy Plan Debridement;Dressing change;Patient/family education;Pulsatile lavage with suction  Wound Therapy - Frequency 6X / week  Wound Therapy - Current Recommendations PT  Wound Therapy - Follow Up Recommendations dressing changes by RN;dressing changes by family/patient  Wound Therapy Goals - Improve the function of patient's integumentary system by progressing the wound(s) through the phases of wound healing by:  Decrease Necrotic Tissue to 25  Decrease Necrotic Tissue - Progress Progressing toward goal  Increase Granulation Tissue to 75  Increase Granulation Tissue - Progress Progressing toward goal  Improve Drainage Characteristics Min  Improve Drainage Characteristics - Progress Progressing toward goal  Patient/Family will be able to  reposition, perform dressing change  Patient/Family Instruction Goal - Progress Progressing toward goal  Goals/treatment plan/discharge plan were made with and agreed upon by patient/family Yes  Time For Goal Achievement 2 weeks  Wound Therapy - Potential for Goals Good  Blanchard Kelch PT Acute Rehabilitation Services Pager [phone] Office [phone] Deb , 20 cm , gun and shield [phone]
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
## 2021-02-12 NOTE — TOC Progression Note (Signed)
Transition of Care [facility]) - Progression Note    Patient Details  Name: John Figueroa MRN: [id number] Date of Birth: 1981/06/22  Transition of Care [facility]) CM/SW Contact  Geni Bers, RN Phone Number: 02/12/2021, 1:02 PM  Clinical Narrative:    Spoke with pt concerning Wound Care Center, Pt states that he will have transportation. Will need MD to place referral for Wound Care Center. Appointment at [facility] Care Center 03/25/2021 at 1:15 PM. Pt accepted appointment.    Expected Discharge Plan: Home/Self Care Barriers to Discharge: No Barriers Identified  Expected Discharge Plan and Services Expected Discharge Plan: Home/Self Care       Living arrangements for the past 2 months: Skilled Nursing Facility                                       Social Determinants of Health (SDOH) Interventions    Readmission Risk Interventions No flowsheet data found.
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
## 2021-02-12 NOTE — Progress Notes (Signed)
PT Cancellation Note  Patient Details Name: JEFERSON BOOZER MRN: [id number] DOB: 06/10/1981   Cancelled Treatment:    Reason Eval/Treat Not Completed: PT screened for mobility. Patient reports independent transfers and just completed bowel regimen while in [place] without any support from staff. PT will continue hydrotherapy. No further mobility needs Rada Hay 02/12/2021, 9:47 AM Blanchard Kelch PT Acute Rehabilitation Services Pager [phone] Office [phone]
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
## 2021-02-12 NOTE — Progress Notes (Signed)
pt has been refusing ensure drink the whole day. Pt has been educated on the benefits of ensure and other nutritional food for the healing of his wound. Pt states he is depressed that's why he is not eating or drinking, he also states that ensure gives him diarrhea.

## 2021-02-13 DIAGNOSIS — E871 Hypo-osmolality and hyponatremia: Secondary | ICD-10-CM | POA: Diagnosis not present

## 2021-02-13 DIAGNOSIS — M726 Necrotizing fasciitis: Secondary | ICD-10-CM | POA: Diagnosis not present

## 2021-02-13 LAB — CREATININE, SERUM
Creatinine, Ser: 0.71 mg/dL (ref 0.61–1.24)
GFR, Estimated: >60 mL/min (ref >60)

## 2021-02-13 MED ORDER — HYDROMORPHONE HCL 1 MG/ML IJ SOLN: 0.5000 mg | INTRAMUSCULAR | Status: DC | PRN

## 2021-02-13 MED ORDER — BOOST / RESOURCE BREEZE PO LIQD CUSTOM
1.0000 | Freq: Two times a day (BID) | ORAL | Status: DC
  Administered 2021-02-13 – 2021-02-24 (×18): 1 via ORAL

## 2021-02-13 MED ORDER — ZINC SULFATE 220 (50 ZN) MG PO CAPS
220.0000 mg | ORAL_CAPSULE | Freq: Every day | ORAL | Status: DC
  Administered 2021-02-13 – 2021-02-24 (×11): 220 mg via ORAL
  Filled 2021-02-13 (×11): qty 1

## 2021-02-13 MED ORDER — HYDROCODONE-ACETAMINOPHEN 5-325 MG PO TABS: 1.0000 | ORAL_TABLET | Freq: Four times a day (QID) | ORAL | Status: DC | PRN

## 2021-02-13 MED ORDER — ASCORBIC ACID 500 MG PO TABS
500.0000 mg | ORAL_TABLET | Freq: Two times a day (BID) | ORAL | Status: DC
  Administered 2021-02-13 – 2021-02-24 (×22): 500 mg via ORAL
  Filled 2021-02-13 (×22): qty 1

## 2021-02-13 NOTE — Consult Note (Signed)
  Nurse practitioner attempted twice to assess patient.  First time patient asked provider to come back, as he had to use the restroom in was receiving physical therapy.  Second attempt patient did state it was a good time to come in, however he bluntly stated" I do not need you.  IM going be good."  Provider further assess this patient's ongoing symptoms, and his request for psychiatric provider.  In which he again further denies needing any psychiatric services at this time.  Provider offered chaplain services, in which he again denies any type of additional assistance at this time.  Patient is advised if he changes his mind, and is willing to speak with someone from the psychiatric team please let someone know.  -Psychiatry to sign off at this time services are not needed.

## 2021-02-13 NOTE — Progress Notes (Signed)
   02/13/21 1100  Subjective Assessment  Subjective How long willt take to heal.  Patient and Family Stated Goals to get a new WC and cushion  Date of Onset  (present on adm. S/P debridement 02/10/21)  Prior Treatments I treated it myself, my primary doctor  Evaluation and Treatment  Evaluation and Treatment Procedures Explained to Patient/Family Yes  Evaluation and Treatment Procedures agreed to  Pressure Injury 02/11/21 Flank Left Stage 3 -  Full thickness tissue loss. Subcutaneous fat may be visible but bone, tendon or muscle are NOT exposed. large open wound on left buttock( post surgical debridement).PT ONLY  Date First Assessed/Time First Assessed: 02/11/21 1510   Location: Flank  Location Orientation: Left  Staging: Stage 3 -  Full thickness tissue loss. Subcutaneous fat may be visible but bone, tendon or muscle are NOT exposed.  Wound Description (Comme...  Wound Image  (see CSS note)  Dressing Type ABD;Moist to dry;Paper tape  Dressing Changed  Dressing Change Frequency Twice a day  Site / Wound Assessment Clean  % Wound base Red or Granulating 55%  % Wound base Yellow/Fibrinous Exudate 45%  Wound Length (cm) 11 cm  Wound Width (cm) 9 cm  Wound Depth (cm) 2 cm  Wound Surface Area (cm^2) 99 cm^2  Wound Volume (cm^3) 198 cm^3  Tunneling (cm) 8 (near center, angled at 1:00)  Hydrotherapy  Pulsed Lavage with Suction (psi) 12 psi  Pulsed Lavage with Suction - Normal Saline Used 1000 mL  Pulsed Lavage Tip Tip with splash shield  Pulsed lavage therapy - wound location left flank/buttock near gluteal cleft  Selective Debridement  Selective Debridement - Location buttock  Selective Debridement - Tools Used Forceps;Scissors (4x4 rubbed around insside undermining)  Selective Debridement - Tissue Removed nonviable , black,  Wound Therapy - Assess/Plan/Recommendations  Wound Therapy - Clinical Statement Patient asking for a picture on his phone of wound, PT took the picture. CCS PA  in , PT to continue hydro therapy.. Noted 8 CM tunnel near center of wound bed. Increased pink tissue, remains necrotic muscle and slough.  Wound Therapy - Functional Problem List L1 paraplegia, impaired sensation  Factors Delaying/Impairing Wound Healing Altered sensation;Diabetes Mellitus;Immobility;Infection - systemic/local  Hydrotherapy Plan Debridement;Dressing change;Patient/family education;Pulsatile lavage with suction  Wound Therapy - Frequency 6X / week  Wound Therapy - Current Recommendations PT  Wound Therapy - Follow Up Recommendations dressing changes by RN;dressing changes by family/patient  Wound Therapy Goals - Improve the function of patient's integumentary system by progressing the wound(s) through the phases of wound healing by:  Decrease Necrotic Tissue to 25  Increase Granulation Tissue to 75  Improve Drainage Characteristics Min  Patient/Family will be able to  reposition, perform dressing change  Goals/treatment plan/discharge plan were made with and agreed upon by patient/family Yes  Time For Goal Achievement 2 weeks  Wound Therapy - Potential for Goals Good  Blanchard Kelch PT Acute Rehabilitation Services Pager [phone] Office [phone]  Deb <20,, tip and [phone]
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
## 2021-02-13 NOTE — Progress Notes (Signed)
Diagnosis: Deep space soft tissue infection left buttock Nec fasc paraplegic  Culture Result: I&D culture mrsa (R tetra/bactrim) CT showed extensive soft tissue infection, without osseous involvement  Given nature of infection, broad-spectrum abx indicated beyond mrsa coverage  -continue linezolid and amox-clav -please plan for 2 weeks duration on discharge (not counting days in the hospital) -id f/u as below -discussed with primary team -will sign off  Clinic Follow Up Appt: 9/01 @ pm  @  [facility] [address], [place], [place] [address] Phone: [phone]  I spent more than 35 minute reviewing data/chart, and coordinating care and >50% direct face to face time providing counseling/discussing diagnostics/treatment plan with patient  --------------------- Subjective: Patient without n/v/diarrhea or f/c Feels well Asking for xanax which I deferred to primary team  Culture with mrsa only and fairly MDR   Exam: Vitals with BMI 02/13/2021 02/13/2021 02/13/2021  Height - - -  Weight - 237 lbs 10 oz -  BMI - 27.47 -  Systolic 127 - 122  Diastolic 76 - 73  Pulse 110 - 622  Some encounter information is confidential and restricted. Go to Review Flowsheets activity to see all data.   General/constitutional: no distress, pleasant HEENT: Normocephalic, PER, Conj Clear, EOMI, Oropharynx clear Neck supple CV: rrr no mrg Lungs: clear to auscultation, normal respiratory effort Abd: Soft, Nontender Ext: no edema Skin: reviewed ortho picture from today's visit --> much improved necrotic tissue burden; no surrounding erythema; no purulence Neuro: paraplegic MSK: no peripheral joint swelling/tenderness/warmth; back spines nontender  Labs: Lab Results  Component Value Date   WBC 26.1 (H) 02/12/2021   HGB 8.6 (L) 02/12/2021   HCT 26.7 (L) 02/12/2021   MCV 91.1 02/12/2021   PLT 553 (H) 02/12/2021   Last metabolic panel Lab Results  Component Value Date   GLUCOSE  126 (H) 02/12/2021   NA 133 (L) 02/12/2021   K 3.1 (L) 02/12/2021   CL 99 02/12/2021   CO2 26 02/12/2021   BUN 18 02/12/2021   CREATININE 0.71 02/13/2021   GFRNONAA >60 02/13/2021   GFRAA >60 03/10/2020   CALCIUM 8.0 (L) 02/12/2021   PHOS 2.2 (L) 06/12/2010   PROT 6.5 02/10/2021   ALBUMIN 2.4 (L) 02/10/2021   BILITOT 1.1 02/10/2021   ALKPHOS 51 02/10/2021   AST 18 02/10/2021   ALT 15 02/10/2021   ANIONGAP 8 02/12/2021     Imaging: Reviewed personally and incorporated in decision making regarding assessment/plan  8/16 abd ct 1. Extensive soft tissue gas/possible necrotizing infection within the left buttock that involves the subcutaneous fat as well as the gluteus musculature; gas extends into the left ischial rectal fossa and there is cutaneous extension of gas collection presumably corresponding to history of draining wound. Enlarged edematous appearing left gluteus maximus muscle consistent with myositis with multiple gas and fluid collections in and around the muscle consistent with soft tissue abscess. Focal gas collection and phlegmonous change at the left ischial tuberosity without frank osseous destructive change. Edema/probable myositis within the visualized proximal posterior thigh muscles. 2. Small left pleural effusion with atelectasis or mild pneumonia at the bases. 3. Small pericardial effusion 4. Thick-walled urinary bladder indeterminate for cystitis 5. Multiple enlarged retroperitoneal, pelvic and inguinal nodes likely reactive.
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
## 2021-02-14 DIAGNOSIS — Z7289 Other problems related to lifestyle: Secondary | ICD-10-CM | POA: Diagnosis not present

## 2021-02-14 DIAGNOSIS — E871 Hypo-osmolality and hyponatremia: Secondary | ICD-10-CM | POA: Diagnosis not present

## 2021-02-14 DIAGNOSIS — M726 Necrotizing fasciitis: Secondary | ICD-10-CM | POA: Diagnosis not present

## 2021-02-14 DIAGNOSIS — R45851 Suicidal ideations: Secondary | ICD-10-CM | POA: Diagnosis not present

## 2021-02-14 DIAGNOSIS — F1090 Alcohol use, unspecified, uncomplicated: Secondary | ICD-10-CM

## 2021-02-14 DIAGNOSIS — Z789 Other specified health status: Secondary | ICD-10-CM

## 2021-02-14 DIAGNOSIS — F331 Major depressive disorder, recurrent, moderate: Secondary | ICD-10-CM | POA: Diagnosis not present

## 2021-02-14 DIAGNOSIS — G8220 Paraplegia, unspecified: Secondary | ICD-10-CM

## 2021-02-14 LAB — BASIC METABOLIC PANEL
Anion gap: 7 (ref 5–15)
BUN: 14 mg/dL (ref 6–20)
CO2: 25 mmol/L (ref 22–32)
Calcium: 8.3 mg/dL — ABNORMAL LOW (ref 8.9–10.3)
Chloride: 102 mmol/L (ref 98–111)
Creatinine, Ser: 0.77 mg/dL (ref 0.61–1.24)
GFR, Estimated: >60 mL/min (ref >60)
Glucose, Bld: 81 mg/dL (ref 70–99)
Potassium: 3.7 mmol/L (ref 3.5–5.1)
Sodium: 134 mmol/L — ABNORMAL LOW (ref 135–145)

## 2021-02-14 LAB — AEROBIC/ANAEROBIC CULTURE W GRAM STAIN (SURGICAL/DEEP WOUND): Gram Stain: NONE SEEN

## 2021-02-14 MED ORDER — LOPERAMIDE HCL 2 MG PO CAPS
2.0000 mg | ORAL_CAPSULE | ORAL | Status: DC | PRN
  Administered 2021-02-14 – 2021-02-24 (×17): 2 mg via ORAL
  Filled 2021-02-14 (×17): qty 1

## 2021-02-14 MED ORDER — ENOXAPARIN SODIUM 40 MG/0.4ML IJ SOSY
40.0000 mg | PREFILLED_SYRINGE | Freq: Every day | INTRAMUSCULAR | Status: DC
  Administered 2021-02-14 – 2021-02-22 (×6): 40 mg via SUBCUTANEOUS
  Filled 2021-02-14 (×9): qty 0.4

## 2021-02-14 MED ORDER — FLUOXETINE HCL 20 MG PO CAPS
20.0000 mg | ORAL_CAPSULE | Freq: Every day | ORAL | Status: DC
  Administered 2021-02-14: 20 mg via ORAL
  Filled 2021-02-14: qty 1

## 2021-02-14 NOTE — Progress Notes (Addendum)
   02/14/21 1635  Hydrotherapy note 1450-1533  Subjective Assessment  Patient and Family Stated Goals to get a new WC and cushion  Date of Onset  (present on adm. S/P debridement 02/10/21)  Prior Treatments I treated it myself, my primary doctor  Evaluation and Treatment  Evaluation and Treatment Procedures Explained to Patient/Family Yes  Evaluation and Treatment Procedures agreed to  Pressure Injury 02/11/21 Flank Left Stage 3 -  Full thickness tissue loss. Subcutaneous fat may be visible but bone, tendon or muscle are NOT exposed. large open wound on left buttock( post surgical debridement).PT ONLY  Date First Assessed/Time First Assessed: 02/11/21 1510   Location: Flank  Location Orientation: Left  Staging: Stage 3 -  Full thickness tissue loss. Subcutaneous fat may be visible but bone, tendon or muscle are NOT exposed.  Wound Description (Comme...  Dressing Type ABD;Moist to dry;Gauze (Comment);Paper tape  Dressing Changed  Dressing Change Frequency PRN  Site / Wound Assessment Clean  % Wound base Red or Granulating 55%  % Wound base Yellow/Fibrinous Exudate 45%  Peri-wound Assessment Intact  Wound Length (cm) 11 cm  Wound Width (cm) 9 cm  Wound Depth (cm) 3 cm  Wound Surface Area (cm^2) 99 cm^2  Wound Volume (cm^3) 297 cm^3  Tunneling (cm) 8  Undermining (cm) 1.5  Margins Unattached edges (unapproximated)  Drainage Amount Moderate (RN changed recently)  Drainage Description Serosanguineous  Treatment Cleansed;Debridement (Selective);Hydrotherapy (Pulse lavage);Packing (Saline gauze)  Hydrotherapy  Pulsed Lavage with Suction (psi) 12 psi  Pulsed Lavage with Suction - Normal Saline Used 1000 mL  Pulsed Lavage Tip Tip with splash shield  Pulsed lavage therapy - wound location left flank/buttock near gluteal cleft  Selective Debridement  Selective Debridement - Location buttock  Selective Debridement - Tools Used Forceps;Scissors (4x4 rubbed around insside undermining)   Selective Debridement - Tissue Removed nonviable ,  slough  gray,,  Wound Therapy - Assess/Plan/Recommendations  Wound Therapy - Clinical Statement Patient quiet. patient hande a drink cup that had dark urine, patient had no self cathed that could be seen as patient covered up with blanket. RN notified of urine placed in BR . Patient now on contact and SI precautions. wound pink  except slough  and muscle in middle of wound. Some bleeding from  tunneling . Continue  hydrotherapy.  Wound Therapy - Functional Problem List L1 paraplegia, impaired sensation  Factors Delaying/Impairing Wound Healing Altered sensation;Diabetes Mellitus;Immobility;Infection - systemic/local  Hydrotherapy Plan Debridement;Dressing change;Patient/family education;Pulsatile lavage with suction  Wound Therapy - Frequency 6X / week  Wound Therapy - Current Recommendations PT  Wound Therapy - Follow Up Recommendations dressing changes by RN;dressing changes by family/patient  Wound Therapy Goals - Improve the function of patient's integumentary system by progressing the wound(s) through the phases of wound healing by:  Decrease Necrotic Tissue to 25  Increase Granulation Tissue to 75  Increase Granulation Tissue - Progress Progressing toward goal     Improve Drainage Characteristics Min  Improve Drainage Characteristics - Progress Progressing toward goal  Patient/Family will be able to  reposition, perform dressing change  Patient/Family Instruction Goal - Progress Progressing toward goal  Goals/treatment plan/discharge plan were made with and agreed upon by patient/family Yes  Time For Goal Achievement 2 weeks  Wound Therapy - Potential for Goals Good  Blanchard Kelch PT Acute Rehabilitation Services Pager [phone] Office [phone]  Deb <2 cm2 gun and tip, dr.

## 2021-02-14 NOTE — Assessment & Plan Note (Signed)
--  no evidence of withdrawal

## 2021-02-14 NOTE — Assessment & Plan Note (Signed)
--  management per psychiatry, rec for inpt treatment

## 2021-02-14 NOTE — Assessment & Plan Note (Addendum)
--   stable, continue Prozac

## 2021-02-14 NOTE — Assessment & Plan Note (Addendum)
--  resolved. Cleared by psychiatry for discharge home --continue current dose of fluoxetine 10 mg p.o. daily, monitor for serotonin syndrome while on linezolid, will need to follow up with outpatient psychiatry and therapy. -May benefit from intensive outpatient programming, initiate referral through behavioral health outpatient IOP program.
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
## 2021-02-14 NOTE — Assessment & Plan Note (Signed)
--  secondary to paraplegia, continue self-cath

## 2021-02-14 NOTE — Assessment & Plan Note (Addendum)
--  now stable for discharge per psych --continue Prozac low dose, watch for SS while on Zyvox

## 2021-02-14 NOTE — Assessment & Plan Note (Addendum)
--  Status postoperative debridement 8/16, continue antibiotics per ID.  Continue management per general surgery including wound care --air overlay mattress here and home --Continue PT wound care, can discharge when cleared by general surgery, likely after a few more treatments.

## 2021-02-14 NOTE — Progress Notes (Signed)
   02/14/21 1200  [place] Suicide Severity Rating Scale  1. Wish to be Dead Yes  2. Suicidal Thoughts Yes  3. Suicidal Thoughts with Method Without Specific Plan or Intent to Act Yes  4. Suicidal Intent Without Specific Plan No  5. Suicide Intent with Specific Plan Yes  6. Suicide Behavior Question No  C-SSRS RISK CATEGORY High Risk  Patient location: In-patient Unit  IP Suicide Precautions Interventions  IP Suicide Precaution Interventions High Risk Interventions implemented  Risk and Protective Factors  Activating Events Current or pending isolation or feeling alone  Describe Activating Events associated with loss/negative event pt states "he is tired of living this way"  Clinical Status (Recent) Chronic physical pain or other acute medical problems (HIV/AIDS, COPD, cancer, etc.)  Suicide Precautions Documentation  1:1 observation ordered for Suicide 1:1 (staff member present) PATIENT CANNOT BE LEFT ALONE  Metal detector/wanding performed  N/A  Additional environmental searches performed? No  Only plastic utensils, plates, and cups are on the meal trays Yes  Patient Location RM  Behavior C  Patient asleep? No  Tubes/Drains/Hands Visible? Yes  Suicide Precaution Room Checklist  Off-going 1:1 monitor remains with patient Yes  Potentially harmful objects removed from room Yes  Potentially harmful objects removed from personal belongings Yes  Patient wearing only hospital-provided attire Yes  1 trash can per room with single paper line - No linen cart Yes  Blood pressure cuff and cord either removed or secured Yes  Monitoring cords, cables, and patient equipment either removed or secured Yes  Potentially toxic materials removed from room Yes  Nurse call bell or TV controller with cords are removed Yes  Sharps container is removed No  Toilet paper holder is removed unless mounted on wall No  All windows are closed and locked if possible Yes  All drapery cords are taped and secured  Yes  All power cords are taped or tied behind lower part of headboard/bed Yes  All cabinets/drawers containing personal items are secured Yes  Sitter magnet and sign are posted on door Yes  Bathroom key given to oncoming personnel N/A  Report given between oncoming and off-going observer Yes  Report given between oncoming RN and oncoming observer Yes  Oncoming RN verified that environmental check completed per policy  Yes  [place] Suicide Severity Rating Scale-Reassessment  2. Suicidal Thoughts Yes  3. Suicidal Thoughts with Method Without Specific Plan or Intent to Act  (Pt states he "wants to jump off a bridge")  4. Suicidal Intent Without Specific Plan  ("I do have a plan on how to do it")  5. Suicide Intent with Specific Plan  (states he "will hire an uber to take him to the bridge")  6. Suicide Behavior Question No

## 2021-02-14 NOTE — Assessment & Plan Note (Signed)
--  supportive care ?

## 2021-02-14 NOTE — Progress Notes (Signed)
Patient tried to I&O cath himself and got nothing. Then he got up to bathroom to have BM and said that "blood and clots shot out of his penis". RN did not see, he flushed urine. Temp is 99.7 and he appears to have chills. Says he "guesses 100 ml came out". Bladder scanned and there was 113 in bladder. Says he thinks he has a bladder infection. Md made aware and will continue to monitor. Melton Alar

## 2021-02-14 NOTE — Assessment & Plan Note (Signed)
--  slowly trending up, asymptomatic

## 2021-02-15 DIAGNOSIS — R45851 Suicidal ideations: Secondary | ICD-10-CM | POA: Diagnosis not present

## 2021-02-15 DIAGNOSIS — G8220 Paraplegia, unspecified: Secondary | ICD-10-CM | POA: Diagnosis not present

## 2021-02-15 DIAGNOSIS — E871 Hypo-osmolality and hyponatremia: Secondary | ICD-10-CM | POA: Diagnosis not present

## 2021-02-15 DIAGNOSIS — M726 Necrotizing fasciitis: Secondary | ICD-10-CM | POA: Diagnosis not present

## 2021-02-15 LAB — CULTURE, BLOOD (ROUTINE X 2)
Culture: NO GROWTH
Culture: NO GROWTH
Special Requests: ADEQUATE
Special Requests: ADEQUATE

## 2021-02-15 MED ORDER — FLUOXETINE HCL 10 MG PO CAPS
10.0000 mg | ORAL_CAPSULE | Freq: Every day | ORAL | Status: DC
  Administered 2021-02-15 – 2021-02-24 (×10): 10 mg via ORAL
  Filled 2021-02-15 (×11): qty 1

## 2021-02-15 NOTE — Plan of Care (Signed)
Patient ID: John Figueroa, male   DOB: 1980-10-11, 40 y.o.   MRN: [id number]    Problem: Education: Goal: Knowledge of General Education information will improve Description: Including pain rating scale, medication(s)/side effects and non-pharmacologic comfort measures Outcome: Progressing   Problem: Health Behavior/Discharge Planning: Goal: Ability to manage health-related needs will improve Outcome: Progressing   Problem: Clinical Measurements: Goal: Will remain free from infection Outcome: Progressing    Lidia Collum, RN
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
## 2021-02-16 DIAGNOSIS — M726 Necrotizing fasciitis: Secondary | ICD-10-CM | POA: Diagnosis not present

## 2021-02-16 DIAGNOSIS — F331 Major depressive disorder, recurrent, moderate: Secondary | ICD-10-CM | POA: Diagnosis not present

## 2021-02-16 LAB — CBC
HCT: 29.8 % — ABNORMAL LOW (ref 39.0–52.0)
Hemoglobin: 9.5 g/dL — ABNORMAL LOW (ref 13.0–17.0)
MCH: 29.9 pg (ref 26.0–34.0)
MCHC: 31.9 g/dL (ref 30.0–36.0)
MCV: 93.7 fL (ref 80.0–100.0)
Platelets: 889 10*3/uL — ABNORMAL HIGH (ref 150–400)
RBC: 3.18 MIL/uL — ABNORMAL LOW (ref 4.22–5.81)
RDW: 17.1 % — ABNORMAL HIGH (ref 11.5–15.5)
WBC: 23.5 10*3/uL — ABNORMAL HIGH (ref 4.0–10.5)
nRBC: 0.1 % (ref 0.0–0.2)

## 2021-02-16 MED ORDER — DIPHENHYDRAMINE HCL 25 MG PO CAPS: 25.0000 mg | ORAL_CAPSULE | Freq: Four times a day (QID) | ORAL | Status: DC | PRN

## 2021-02-16 MED ORDER — ZOLPIDEM TARTRATE 5 MG PO TABS
5.0000 mg | ORAL_TABLET | Freq: Every evening | ORAL | Status: DC | PRN
  Administered 2021-02-16 – 2021-02-21 (×6): 5 mg via ORAL
  Filled 2021-02-16 (×6): qty 1
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
## 2021-02-16 NOTE — Progress Notes (Signed)
Called to room by NT sitter, pt c/o of nose bleed and spitting up blood, VS checked, right nostril red and slightly inflamed. Left nostril without not confirm sign of bleeding. Cool cloth applied by pt, MD made aware. Will cont to monitor pt for further or ongoing and update MD when needed. SRP, RN

## 2021-02-16 NOTE — Progress Notes (Addendum)
   02/16/21 1700 Hydrotherapy note.  Subjective Assessment  Subjective What  is the  reason for this( explained PLS to patient)  Patient and Family Stated Goals to get a new WC and cushion  Date of Onset  (present on adm. S/P debridement 02/10/21)  Prior Treatments I treated it myself, my primary doctor  Evaluation and Treatment  Evaluation and Treatment Procedures Explained to Patient/Family Yes  Evaluation and Treatment Procedures agreed to  Pressure Injury 02/11/21 Flank Left Stage 3 -  Full thickness tissue loss. Subcutaneous fat may be visible but bone, tendon or muscle are NOT exposed. large open wound on left buttock( post surgical debridement).PT ONLY  Date First Assessed/Time First Assessed: 02/11/21 1510   Location: Flank  Location Orientation: Left  Staging: Stage 3 -  Full thickness tissue loss. Subcutaneous fat may be visible but bone, tendon or muscle are NOT exposed.  Wound Description (Comme...  Dressing Type ABD;Moist to dry;Paper tape  Dressing Changed  Dressing Change Frequency PRN  State of Healing Early/partial granulation  Site / Wound Assessment Granulation tissue;Brown  % Wound base Red or Granulating 55%  % Wound base Yellow/Fibrinous Exudate 45%  Peri-wound Assessment Intact  Wound Length (cm) 11 cm  Wound Width (cm) 9 cm  Wound Depth (cm) 3 cm  Wound Surface Area (cm^2) 99 cm^2  Wound Volume (cm^3) 297 cm^3  Tunneling (cm) 8 (noted gap between muslce that leads to tunnel directed at caudal)  Margins Unattached edges (unapproximated)  Drainage Amount Moderate  Drainage Description Serosanguineous  Hydrotherapy  Pulsed Lavage with Suction (psi) 12 psi  Pulsed Lavage with Suction - Normal Saline Used 1000 mL  Pulsed Lavage Tip Tip with splash shield  Pulsed lavage therapy - wound location left flank/buttock near gluteal cleft  Selective Debridement  Selective Debridement - Location buttock  Selective Debridement - Tools Used Forceps;Scissors (4x4 rubbed  around insside undermining)  Selective Debridement - Tissue Removed nonviable ,  slough  gray,,  Wound Therapy - Assess/Plan/Recommendations  Wound Therapy - Clinical Statement Patient asking for a cup,so he can urinate. wound continues with brouwn slough, tunneling 8 CM. Continue  hydrotherapy..  Wound Therapy - Functional Problem List L1 paraplegia, impaired sensation  Factors Delaying/Impairing Wound Healing Altered sensation;Diabetes Mellitus;Immobility;Infection - systemic/local  Hydrotherapy Plan Debridement;Dressing change;Patient/family education;Pulsatile lavage with suction  Wound Therapy - Frequency 6X / week  Wound Therapy - Current Recommendations PT  Wound Therapy - Follow Up Recommendations dressing changes by RN;dressing changes by family/patient  Wound Therapy Goals - Improve the function of patient's integumentary system by progressing the wound(s) through the phases of wound healing by:  Decrease Necrotic Tissue to 25  Decrease Necrotic Tissue - Progress Progressing toward goal  Increase Granulation Tissue to 75  Increase Granulation Tissue - Progress Progressing toward goal  Improve Drainage Characteristics Min  Improve Drainage Characteristics - Progress Progressing toward goal  Patient/Family will be able to  reposition, perform dressing change  Patient/Family Instruction Goal - Progress Progressing toward goal  Goals/treatment plan/discharge plan were made with and agreed upon by patient/family Yes  Time For Goal Achievement 2 weeks  Wound Therapy - Potential for Goals Good  Blanchard Kelch PT Acute Rehabilitation Services Pager [phone] Office [phone]   Deb <20cm, dr gun and tip  1500 1540
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
## 2021-02-16 NOTE — Progress Notes (Signed)
Suicide sitter discontinued, pt resting. Nose bleeding has stopped, no additional findings. SRP, RN

## 2021-02-17 DIAGNOSIS — M726 Necrotizing fasciitis: Secondary | ICD-10-CM | POA: Diagnosis not present

## 2021-02-17 NOTE — Progress Notes (Signed)
Pt Electronic Monitoring/Tracking Device was removed this am, by Officers Kizzie Bane, staff officer for Mady Haagensen in charge of the Microbiologist (home tracking system for individuals in custody) both officers of [employer]. Office Troxler removed the monitoring device from right ankle, due to device monitor sending message out. Officer Troxler gave instructions to call [phone] prior to pt leaving the hospital at discharge, pt electronic tracking device is to be replaced prior to leaving the hospital. Will also update Care management. SRP,RN

## 2021-02-17 NOTE — TOC Progression Note (Signed)
Transition of Care [facility]) - Progression Note    Patient Details  Name: John Figueroa MRN: [id number] Date of Birth: 03-30-1981  [facility] of [facility]) CM/SW Contact  Geni Bers, RN Phone Number: 02/17/2021, 9:45 AM  Clinical Narrative:    The pt's staff RN will need to make the call to [facility] Department when he is discharging from hospital. Related to the RN knowing the time pt is ready to discharge and leave the hospital.   Expected Discharge Plan: Home/Self Care Barriers to Discharge: No Barriers Identified  Expected Discharge Plan and Services Expected Discharge Plan: Home/Self Care       Living arrangements for the past 2 months: Skilled Nursing Facility                                       Social Determinants of Health (SDOH) Interventions    Readmission Risk Interventions No flowsheet data found.

## 2021-02-17 NOTE — Plan of Care (Signed)
  Problem: Education: Goal: Knowledge of General Education information will improve Description: Including pain rating scale, medication(s)/side effects and non-pharmacologic comfort measures Outcome: Progressing   Problem: Health Behavior/Discharge Planning: Goal: Ability to manage health-related needs will improve Outcome: Progressing   Problem: Clinical Measurements: Goal: Will remain free from infection Outcome: Progressing   

## 2021-02-17 NOTE — Progress Notes (Addendum)
   02/17/21 1700  Hydrotherpay note.  Subjective Assessment  Subjective How long will it take to heal?  Patient and Family Stated Goals to get a new WC and cushion  Date of Onset  (present on adm. S/P debridement 02/10/21)  Prior Treatments I treated it myself, my primary doctor  Evaluation and Treatment  Evaluation and Treatment Procedures Explained to Patient/Family Yes  Evaluation and Treatment Procedures agreed to  Pressure Injury 02/11/21 Flank Left Stage 3 -  Full thickness tissue loss. Subcutaneous fat may be visible but bone, tendon or muscle are NOT exposed. large open wound on left buttock( post surgical debridement).PT ONLY  Date First Assessed/Time First Assessed: 02/11/21 1510   Location: Flank  Location Orientation: Left  Staging: Stage 3 -  Full thickness tissue loss. Subcutaneous fat may be visible but bone, tendon or muscle are NOT exposed.  Wound Description (Comme...  Dressing Type ABD;Moist to dry;Paper tape;Gauze (Comment)  Dressing Changed  Dressing Change Frequency PRN  State of Healing Early/partial granulation  Site / Wound Assessment Brown;Pink  % Wound base Red or Granulating 55%  % Wound base Yellow/Fibrinous Exudate 45%  Peri-wound Assessment Intact  Wound Length (cm) 11 cm  Wound Width (cm) 9 cm  Wound Depth (cm) 4 cm ('crevice" area between muscle fibers)  Wound Surface Area (cm^2) 99 cm^2  Wound Volume (cm^3) 396 cm^3  Tunneling (cm) 8 (near cebnter at "crevice" tunneling down and caudally)  Undermining (cm) 1.5  Margins Unattached edges (unapproximated)  Drainage Amount Moderate  Drainage Description Serosanguineous  Treatment Debridement (Selective);Packing (Saline gauze);Hydrotherapy (Pulse lavage)  Hydrotherapy  Pulsed Lavage with Suction (psi) 12 psi  Pulsed Lavage with Suction - Normal Saline Used 1000 mL  Pulsed Lavage Tip Tip with splash shield  Pulsed lavage therapy - wound location left flank/buttock near gluteal cleft  Selective  Debridement  Selective Debridement - Location buttock  Selective Debridement - Tools Used Forceps;Scissors (4x4 rubbed around insside undermining)  Selective Debridement - Tissue Removed nonviable ,  slough  gray,,  Wound Therapy - Assess/Plan/Recommendations  Wound Therapy - Clinical Statement thick  slough in center of wound, appears necrotic muscle and unidentified tissue. Lorenda Ishihara present , area  4 CM deep between muscle , yellow drainage from tunnel. Will see with surgery hopefully tomorrow. and get some guidance about the slough debridement..  Wound Therapy - Functional Problem List L1 paraplegia, impaired sensation  Factors Delaying/Impairing Wound Healing Altered sensation;Diabetes Mellitus;Immobility;Infection - systemic/local  Hydrotherapy Plan Debridement;Dressing change;Patient/family education;Pulsatile lavage with suction  Wound Therapy - Frequency 6X / week  Wound Therapy - Current Recommendations PT  Wound Therapy - Follow Up Recommendations dressing changes by RN;dressing changes by family/patient  Wound Therapy Goals - Improve the function of patient's integumentary system by progressing the wound(s) through the phases of wound healing by:  Decrease Necrotic Tissue to 25  Decrease Necrotic Tissue - Progress Progressing toward goal  Increase Granulation Tissue to 75  Increase Granulation Tissue - Progress Progressing toward goal  Improve Drainage Characteristics Min  Improve Drainage Characteristics - Progress Progressing toward goal  Patient/Family will be able to  reposition, perform dressing change  Patient/Family Instruction Goal - Progress Progressing toward goal  Goals/treatment plan/discharge plan were made with and agreed upon by patient/family Yes  Time For Goal Achievement 2 weeks  Wound Therapy - Potential for Goals Good  Blanchard Kelch PT Acute Rehabilitation Services Pager [phone] Office [phone] Deb < 20, tip and gun, dressing 1530 1558
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
## 2021-02-17 NOTE — TOC Progression Note (Signed)
Transition of Care [facility]) - Progression Note    Patient Details  Name: John Figueroa MRN: [id number] Date of Birth: [birth year]  Transition of Care [facility]) CM/SW Contact  Geni Bers, RN Phone Number: 02/17/2021, 12:49 PM  Clinical Narrative:    Pt will discharge home with [facility] wound care, [facility] on 9/28.    Expected Discharge Plan: Home/Self Care Barriers to Discharge: No Barriers Identified  Expected Discharge Plan and Services Expected Discharge Plan: Home/Self Care       Living arrangements for the past 2 months: Skilled Nursing Facility                                       Social Determinants of Health (SDOH) Interventions    Readmission Risk Interventions No flowsheet data found.

## 2021-02-18 ENCOUNTER — Inpatient Hospital Stay (HOSPITAL_COMMUNITY): Payer: Managed Care, Other (non HMO)

## 2021-02-18 DIAGNOSIS — F411 Generalized anxiety disorder: Secondary | ICD-10-CM

## 2021-02-18 DIAGNOSIS — N493 Fournier gangrene: Secondary | ICD-10-CM

## 2021-02-18 NOTE — Discharge Instructions (Addendum)
WOUND CARE: - dressing to be changed twice daily - supplies: sterile saline, kerlix/guaze, scissors, ABD pads, tape  - remove dressing and all packing carefully, moistening with sterile saline as needed to avoid packing/internal dressing sticking to the wound. - clean edges of skin around the wound with water/gauze, making sure there is no tape debris or leakage left on skin that could cause skin irritation or breakdown. - dampen clean kerlix/gauze with sterile saline and pack wound from wound base to skin level, making sure to take note of any possible areas of wound tracking, tunneling and packing appropriately. Wound can be packed loosely. Trim kerlix/gauze to size if a whole roll/piece is not required. - cover wound with a dry ABD pad and secure with tape.  - write the date/time on the dry dressing/tape to better track when the last dressing change occurred. - apply any skin protectant/powder recommended by clinician to protect skin/skin folds. - change dressing as needed if leakage occurs, wound gets contaminated, or before a shower. - you may shower daily with wound open and following the shower the wound should be dried and a clean dressing placed.

## 2021-02-18 NOTE — Progress Notes (Signed)
   02/18/21 1300 Hydrotherapy note.  Subjective Assessment  Patient and Family Stated Goals to get a new WC and cushion  Date of Onset  (present on adm. S/P debridement 02/10/21)  Prior Treatments I treated it myself, my primary doctor  Evaluation and Treatment  Evaluation and Treatment Procedures Explained to Patient/Family Yes  Evaluation and Treatment Procedures agreed to  Pressure Injury 02/11/21 Flank Left Stage 3 -  Full thickness tissue loss. Subcutaneous fat may be visible but bone, tendon or muscle are NOT exposed. large open wound on left buttock( post surgical debridement).PT ONLY  Date First Assessed/Time First Assessed: 02/11/21 1510   Location: Flank  Location Orientation: Left  Staging: Stage 3 -  Full thickness tissue loss. Subcutaneous fat may be visible but bone, tendon or muscle are NOT exposed.  Wound Description (Comme...  Dressing Type ABD;Moist to dry;Normal saline moist dressing  Dressing Changed  Dressing Change Frequency PRN  State of Healing Early/partial granulation  % Wound base Red or Granulating 55%  % Wound base Yellow/Fibrinous Exudate 45%  Peri-wound Assessment Intact  Wound Length (cm) 11 cm  Wound Width (cm) 9 cm  Wound Depth (cm) 4 cm  Wound Surface Area (cm^2) 99 cm^2  Wound Volume (cm^3) 396 cm^3  Margins Unattached edges (unapproximated)  Drainage Amount Moderate  Drainage Description Serosanguineous;Purulent  Hydrotherapy  Pulsed Lavage with Suction (psi) 12 psi  Pulsed Lavage with Suction - Normal Saline Used 1000 mL  Pulsed Lavage Tip Tip with splash shield  Pulsed lavage therapy - wound location left flank/buttock near gluteal cleft  Selective Debridement  Selective Debridement - Location buttock  Selective Debridement - Tools Used Forceps;Scissors (4x4 rubbed around insside undermining)  Selective Debridement - Tissue Removed nonviable ,  slough  gray,,  Wound Therapy - Assess/Plan/Recommendations  Wound Therapy - Clinical Statement  thick  slough in center of wound, appears necrotic muscle and unknown tissue. Lorenda Ishihara present and .area  4 CM deep between muscle , yellow drainage from tunnel. Another opening at caudal with purulent drainage. [facility] PA in to see the wound.  Wound Therapy - Functional Problem List L1 paraplegia, impaired sensation  Factors Delaying/Impairing Wound Healing Altered sensation;Diabetes Mellitus;Immobility;Infection - systemic/local  Hydrotherapy Plan Debridement;Dressing change;Patient/family education;Pulsatile lavage with suction  Wound Therapy - Frequency 6X / week  Wound Therapy - Current Recommendations PT  Wound Therapy - Follow Up Recommendations dressing changes by RN;dressing changes by family/patient  Wound Therapy Goals - Improve the function of patient's integumentary system by progressing the wound(s) through the phases of wound healing by:  Decrease Necrotic Tissue to 25  Decrease Necrotic Tissue - Progress Progressing toward goal  Increase Granulation Tissue to 75  Increase Granulation Tissue - Progress Progressing toward goal  Improve Drainage Characteristics Min  Improve Drainage Characteristics - Progress Progressing toward goal  Patient/Family will be able to  reposition, perform dressing change  Goals/treatment plan/discharge plan were made with and agreed upon by patient/family Yes  Time For Goal Achievement 2 weeks  Wound Therapy - Potential for Goals Good  Blanchard Kelch PT Acute Rehabilitation Services Pager [phone] Office [phone] Deb< 1 dr.x 1
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
## 2021-02-18 NOTE — Progress Notes (Signed)
CT scan reviewed with stable to improved findings and no new fluid collection and discussed with Dr. Fredricka Bonine. PT was able to express a superficial fluid collection during hydrotherapy. He is stable for discharge with ongoing wound care from surgical perspective. We will sign off but please reach out with any questions or concerns

## 2021-02-19 ENCOUNTER — Telehealth: Payer: Self-pay | Admitting: Internal Medicine

## 2021-02-19 LAB — COMPREHENSIVE METABOLIC PANEL
ALT: 9 U/L (ref 0–44)
AST: 14 U/L — ABNORMAL LOW (ref 15–41)
Albumin: 2.6 g/dL — ABNORMAL LOW (ref 3.5–5.0)
Alkaline Phosphatase: 52 U/L (ref 38–126)
Anion gap: 4 — ABNORMAL LOW (ref 5–15)
BUN: 11 mg/dL (ref 6–20)
CO2: 27 mmol/L (ref 22–32)
Calcium: 9 mg/dL (ref 8.9–10.3)
Chloride: 111 mmol/L (ref 98–111)
Creatinine, Ser: 0.69 mg/dL (ref 0.61–1.24)
GFR, Estimated: >60 mL/min (ref >60)
Glucose, Bld: 86 mg/dL (ref 70–99)
Potassium: 4 mmol/L (ref 3.5–5.1)
Sodium: 142 mmol/L (ref 135–145)
Total Bilirubin: 0.3 mg/dL (ref 0.3–1.2)
Total Protein: 7.1 g/dL (ref 6.5–8.1)

## 2021-02-19 LAB — URINE CULTURE: Culture: NO GROWTH

## 2021-02-19 LAB — CBC
HCT: 27 % — ABNORMAL LOW (ref 39.0–52.0)
Hemoglobin: 8.5 g/dL — ABNORMAL LOW (ref 13.0–17.0)
MCH: 29.4 pg (ref 26.0–34.0)
MCHC: 31.5 g/dL (ref 30.0–36.0)
MCV: 93.4 fL (ref 80.0–100.0)
Platelets: 715 10*3/uL — ABNORMAL HIGH (ref 150–400)
RBC: 2.89 MIL/uL — ABNORMAL LOW (ref 4.22–5.81)
RDW: 17.3 % — ABNORMAL HIGH (ref 11.5–15.5)
WBC: 18.5 10*3/uL — ABNORMAL HIGH (ref 4.0–10.5)
nRBC: 0 % (ref 0.0–0.2)

## 2021-02-19 NOTE — Telephone Encounter (Signed)
John Figueroa with John Figueroa calling on behalf of John Figueroa need forms sent form for [facility] sent back as the patient is preparing for discharge...  Please call John Figueroa at [phone] ext. [phone]  In case you need another form, John Figueroa [phone] phone [phone] Fax
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
## 2021-02-19 NOTE — Plan of Care (Signed)
  Problem: Education: Goal: Knowledge of General Education information will improve Description: Including pain rating scale, medication(s)/side effects and non-pharmacologic comfort measures Outcome: Progressing   Problem: Health Behavior/Discharge Planning: Goal: Ability to manage health-related needs will improve Outcome: Progressing   Problem: Clinical Measurements: Goal: Will remain free from infection Outcome: Progressing   

## 2021-02-19 NOTE — Progress Notes (Signed)
02/19/21 1300  Hydrotherapy note. [id number]  Subjective Assessment  Patient and Family Stated Goals to get a new WC and cushion  Date of Onset  (present on adm. S/P debridement 02/10/21)  Prior Treatments I treated it myself, my primary doctor  Evaluation and Treatment  Evaluation and Treatment Procedures Explained to Patient/Family Yes  Evaluation and Treatment Procedures agreed to  Pressure Injury 02/11/21 Flank Left Stage 3 -  Full thickness tissue loss. Subcutaneous fat may be visible but bone, tendon or muscle are NOT exposed. large open wound on left buttock( post surgical debridement).PT ONLY  Date First Assessed/Time First Assessed: 02/11/21 1510   Location: Flank  Location Orientation: Left  Staging: Stage 3 -  Full thickness tissue loss. Subcutaneous fat may be visible but bone, tendon or muscle are NOT exposed.  Wound Description (Comme...  Dressing Type Moist to dry;Normal saline moist dressing;ABD;Paper tape  Dressing Changed  Dressing Change Frequency Twice a day  State of Healing Early/partial granulation  Site / Wound Assessment Brown;Pink;Granulation tissue  % Wound base Red or Granulating 55%  % Wound base Yellow/Fibrinous Exudate 45%  Peri-wound Assessment Intact  Wound Length (cm) 11 cm  Wound Width (cm) 9 cm  Wound Depth (cm) 4 cm (6cm deepth between muscle layers)  Wound Surface Area (cm^2) 99 cm^2  Wound Volume (cm^3) 396 cm^3  Tunneling (cm) 8 (in middle of woun , penetrates anteriorly)  Undermining (cm) 1,5  Margins Epibole (rolled edges)  Drainage Amount Moderate  Drainage Description Serosanguineous;No odor  Treatment Debridement (Selective);Hydrotherapy (Pulse lavage);Packing (Saline gauze)  Hydrotherapy  Pulsed Lavage with Suction (psi) 12 psi  Pulsed Lavage with Suction - Normal Saline Used 1000 mL  Pulsed Lavage Tip Tip with splash shield  Pulsed lavage therapy - wound location left flank/buttock near gluteal cleft  Selective Debridement   Selective Debridement - Location buttock  Selective Debridement - Tools Used Forceps;Scissors (4x4 rubbed around insside undermining)  Selective Debridement - Tissue Removed nonviable ,  slough  gray,,  Wound Therapy - Assess/Plan/Recommendations  Wound Therapy - Clinical Statement . Old dressing adhering to granulation  tissue with slight bleeding when removed. Packing tunnels and opening between what appears mucle. Debriding tough slough from mid wound.   Tunneling present  in opening of   3 cm deep, caudal  end with beige drainage. Patient will benefit from continued PLS to assist with debridement  and cleansing of the wound.,  .  Wound Therapy - Functional Problem List L1 paraplegia, impaired sensation  Factors Delaying/Impairing Wound Healing Altered sensation;Diabetes Mellitus;Immobility;Infection - systemic/local  Hydrotherapy Plan Debridement;Dressing change;Patient/family education;Pulsatile lavage with suction  Wound Therapy - Frequency 6X / week  Wound Therapy - Current Recommendations PT  Wound Therapy - Follow Up Recommendations dressing changes by RN;dressing changes by family/patient  Wound Therapy Goals - Improve the function of patient's integumentary system by progressing the wound(s) through the phases of wound healing by:  Decrease Necrotic Tissue to 25  Decrease Necrotic Tissue - Progress Progressing toward goal  Increase Granulation Tissue to 75  Increase Granulation Tissue - Progress Progressing toward goal  Improve Drainage Characteristics Min  Improve Drainage Characteristics - Progress Progressing toward goal  Patient/Family will be able to  reposition, perform dressing change  Patient/Family Instruction Goal - Progress Progressing toward goal  Goals/treatment plan/discharge plan were made with and agreed upon by patient/family Yes  Time For Goal Achievement 2 weeks  Wound Therapy - Potential for Goals Good  Blanchard Kelch PT Acute Rehabilitation Services  Pager  [phone] Office [phone]  Deb< 20, dress, gun and shield.
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
## 2021-02-19 NOTE — [place] FL2 (Signed)
[place] MEDICAID FL2 LEVEL OF CARE SCREENING TOOL     IDENTIFICATION  Patient Name: John Figueroa Birthdate: 10-25-1980 Sex: male Admission Date (Current Location): 02/10/2021  [facility] and [place] Number:  Producer, television/film/video and Address:  [facility],  [address] [place], [place] [address]      Provider Number: [id number]  Attending Physician Name and Address:  Jerald Kief, MD  Relative Name and Phone Number:  Samson Frederic Mother [phone]    Current Level of Care: Hospital Recommended Level of Care: Skilled Nursing Facility Prior Approval Number:    Date Approved/Denied:   PASRR Number: [id number]  Discharge Plan: SNF    Current Diagnoses: Patient Active Problem List   Diagnosis Date Noted   Major depressive disorder, recurrent episode, moderate (HCC) 02/14/2021   Hyponatremia 02/14/2021   Suicidal ideation 02/14/2021   Alcohol use 02/14/2021   Sepsis (HCC)    Pressure injury of skin 02/11/2021   Fournier's gangrene 02/10/2021   Neurogenic bladder 02/10/2021   Self-catheterizes urinary bladder 02/10/2021   Intermittent diarrhea 02/10/2021   Necrotizing fasciitis (HCC) 02/10/2021   Hyperglycemia 01/16/2021   Bilateral edema of lower extremity 01/16/2021   Renal insufficiency 01/16/2021   Paresthesias 03/11/2020   Depression 03/10/2019   Low back pain 03/10/2019   Migraine 03/10/2019   GERD (gastroesophageal reflux disease) 03/10/2019   Hypertension, uncontrolled 03/10/2019   Left leg swelling 08/29/2018   Hypokalemia 01/02/2018   Abnormal LFTs 01/02/2018   Thrombocytopenia (HCC) 01/02/2018   Hematemesis 11/21/2017   Cellulitis of right foot 10/13/2016   Allergic rhinitis 06/09/2016   Fatigue 03/03/2016   Encounter for well adult exam with abnormal findings 09/12/2013   Knee effusion, right 09/12/2013   BREAST MASS 04/20/2010   LEG EDEMA, RIGHT 11/04/2009   ELEVATED BLOOD PRESSURE WITHOUT DIAGNOSIS  OF HYPERTENSION 09/06/2008   Alcohol abuse 03/29/2008   PRESSURE ULCER OTHER SITE 03/29/2008   Anxiety state 05/09/2007   ERECTILE DYSFUNCTION 05/09/2007   Paraplegia to L1 05/09/2007   UTI'S, RECURRENT 05/09/2007    Orientation RESPIRATION BLADDER Height & Weight     Self, Time, Situation, Place  Normal Incontinent (self in and out cath) Weight: 107.8 kg Height:  6\' 6"  (198.1 cm)  BEHAVIORAL SYMPTOMS/MOOD NEUROLOGICAL BOWEL NUTRITION STATUS      Incontinent Diet (Regular)  AMBULATORY STATUS COMMUNICATION OF NEEDS Skin   Extensive Assist Verbally Surgical wounds, Other (Comment) (pressure injury to rt foot anterior, posterior stage 2;medial deep tissue pressure injury;Left Flank stage 3, Rt buttock surgery wound, Wound between buttocks.)                       Personal Care Assistance Level of Assistance  Bathing, Feeding, Dressing Bathing Assistance: Limited assistance Feeding assistance: Independent Dressing Assistance: Limited assistance     Functional Limitations Info  Sight, Hearing, Speech Sight Info: Adequate Hearing Info: Adequate Speech Info: Adequate    SPECIAL CARE FACTORS FREQUENCY  PT (By licensed PT), OT (By licensed OT)     PT Frequency: Eval and Treat OT Frequency: Eval and Treat            Contractures Contractures Info: Present (Paraplegia)    Additional Factors Info  Code Status, Allergies Code Status Info: FULL Allergies Info: No Known Allergies           Current Medications (02/19/2021):  This is the current hospital active medication list Current Facility-Administered Medications  Medication Dose Route Frequency Provider Last  Rate Last Admin   (feeding supplement) PROSource Plus liquid 30 mL  30 mL Oral BID BM Standley Brooking, MD   30 mL at 02/18/21 2218   acetaminophen (TYLENOL) tablet 1,000 mg  1,000 mg Oral Q6H Eduard Clos, MD   1,000 mg at 02/19/21 0540   amoxicillin-clavulanate (AUGMENTIN) 875-125 MG per tablet 1  tablet  1 tablet Oral Q12H Vu, Trung T, MD   1 tablet at 02/19/21 0901   ascorbic acid (VITAMIN C) tablet 500 mg  500 mg Oral BID Standley Brooking, MD   500 mg at 02/19/21 0901   Chlorhexidine Gluconate Cloth 2 % PADS 6 each  6 each Topical Once Eduard Clos, MD       diphenhydrAMINE (BENADRYL) capsule 25 mg  25 mg Oral Q6H PRN Standley Brooking, MD       enoxaparin (LOVENOX) injection 40 mg  40 mg Subcutaneous Daily Standley Brooking, MD   40 mg at 02/19/21 0900   feeding supplement (BOOST / RESOURCE BREEZE) liquid 1 Container  1 Container Oral BID BM Standley Brooking, MD   1 Container at 02/19/21 9323   FLUoxetine (PROZAC) capsule 10 mg  10 mg Oral Daily Akintayo, Mojeed, MD   10 mg at 02/19/21 0858   folic acid (FOLVITE) tablet 1 mg  1 mg Oral Daily Standley Brooking, MD   1 mg at 02/19/21 0901   HYDROcodone-acetaminophen (NORCO/VICODIN) 5-325 MG per tablet 1-2 tablet  1-2 tablet Oral Q6H PRN Standley Brooking, MD       labetalol (NORMODYNE) injection 5 mg  5 mg Intravenous Q2H PRN Eduard Clos, MD       linezolid (ZYVOX) tablet 600 mg  600 mg Oral Q12H Vu, Trung T, MD   600 mg at 02/19/21 0859   lip balm (CARMEX) ointment 1 application  1 application Topical BID Eduard Clos, MD   1 application at 02/19/21 0913   loperamide (IMODIUM) capsule 2 mg  2 mg Oral PRN Standley Brooking, MD   2 mg at 02/19/21 5573   magic mouthwash  15 mL Oral QID PRN Eduard Clos, MD       menthol-cetylpyridinium (CEPACOL) lozenge 3 mg  1 lozenge Oral PRN Eduard Clos, MD       methocarbamol (ROBAXIN) tablet 1,000 mg  1,000 mg Oral Q6H PRN Eduard Clos, MD       metoprolol tartrate (LOPRESSOR) injection 5 mg  5 mg Intravenous Q6H PRN Eduard Clos, MD       multivitamins with iron tablet 1 tablet  1 tablet Oral Daily Eduard Clos, MD   1 tablet at 02/19/21 2202   mupirocin cream (BACTROBAN) 2 %   Topical Daily Standley Brooking, MD   Given at  02/19/21 5427   nutrition supplement (JUVEN) (JUVEN) powder packet 1 packet  1 packet Oral BID BM Standley Brooking, MD   1 packet at 02/19/21 0852   ondansetron [facility]) injection 4 mg  4 mg Intravenous Q6H PRN Eduard Clos, MD       Or   ondansetron [facility]) 8 mg in sodium chloride 0.9 % 50 mL IVPB  8 mg Intravenous Q6H PRN Eduard Clos, MD       phenol (CHLORASEPTIC) mouth spray 2 spray  2 spray Mouth/Throat PRN Eduard Clos, MD       polycarbophil (FIBERCON) tablet 625 mg  625 mg Oral BID Toniann Fail,  Meryle Ready, MD   625 mg at 02/17/21 2050   thiamine tablet 100 mg  100 mg Oral Daily Eduard Clos, MD   100 mg at 02/19/21 0901   zinc sulfate capsule 220 mg  220 mg Oral Daily Standley Brooking, MD   220 mg at 02/19/21 0901   zolpidem (AMBIEN) tablet 5 mg  5 mg Oral QHS PRN Standley Brooking, MD   5 mg at 02/18/21 2217     Discharge Medications: Please see discharge summary for a list of discharge medications.  Relevant Imaging Results:  Relevant Lab Results:   Additional Information SS#[id number]  Geni Bers, RN

## 2021-02-20 NOTE — Progress Notes (Signed)
Physical Therapy Wound Treatment Patient Details  Name: MATTHEO SWINDLE MRN: [id number] Date of Birth: 03-Dec-1980  Today's Date: 02/20/2021 Time: 1779-3903 Time Calculation (min): 37 min  Subjective  Subjective Assessment Patient and Family Stated Goals: to get a new WC and cushion Prior Treatments: I treated it myself, my primary doctor  Pain Score:  No pain  Clinical Statement: Old dressing adhering to granulation  tissue however no bleeding when moistened with saline first.  Debriding tough slough from mid wound. Applied packing to tunnels and opening between what appears muscle.  Tunneling present  in opening near caudal end continuing with beige drainage today. Hydrogel added to viable tissue (granulating, muscle, sheath).  Patient will benefit from continued PLS to assist with debridement and cleansing of the wound.  Pt does present with epibole at perimeter of wound which will prevent wound from appropriate healing.  Wound Assessment                                                                                                                                                      Pressure Injury 02/11/21 Flank Left Stage 3 -  Full thickness tissue loss. Subcutaneous fat may be visible but bone, tendon or muscle are NOT exposed. large open wound on left buttock( post surgical debridement).PT ONLY (Active)  Dressing Type ABD;Barrier Film (skin prep);Normal saline moist dressing;Paper tape;Hydrogel 02/20/21 1603  Dressing Changed 02/20/21 1603  Dressing Change Frequency Twice a day 02/20/21 1603  State of Healing Early/partial granulation 02/20/21 1603  Site / Wound Assessment Bleeding;Granulation tissue;Red;Yellow 02/20/21 1603  % Wound base Red or Granulating 55% 02/20/21 1603  % Wound base Yellow/Fibrinous Exudate 45% 02/20/21 1603  Peri-wound Assessment Intact 02/20/21 1603  Wound Length (cm) 11 cm 02/19/21 1300  Wound Width (cm) 9 cm 02/19/21  1300  Wound Depth (cm) 4 cm 02/19/21 1300  Wound Surface Area (cm^2) 99 cm^2 02/19/21 1300  Wound Volume (cm^3) 396 cm^3 02/19/21 1300  Tunneling (cm) 8 02/19/21 1300  Undermining (cm) 1,5 02/19/21 1300  Margins Epibole (rolled edges) 02/20/21 1603  Drainage Amount Moderate 02/20/21 1603  Drainage Description Serosanguineous 02/20/21 1603  Treatment Debridement (Selective);Hydrotherapy (Pulse lavage);Packing (Saline gauze) 02/20/21 1603                                                                                            Hydrotherapy Pulsed lavage therapy - wound location: left buttock Pulsed Lavage with Suction (psi): 12 psi Pulsed Lavage  with Suction - Normal Saline Used: 1000 mL Pulsed Lavage Tip: Tip with splash shield Selective Debridement Selective Debridement - Location: buttock Selective Debridement - Tools Used: Forceps, Scissors Selective Debridement - Tissue Removed: yellow slough    Wound Assessment and Plan  Wound Therapy - Assess/Plan/Recommendations Wound Therapy - Clinical Statement: Old dressing adhering to granulation  tissue however no bleeding when moistened with saline first.  Debriding tough slough from mid wound. Applied packing to tunnels and opening between what appears muscle.  Tunneling present  in opening near caudal end continuing with beige drainage today. Hydrogel added to viable tissue (granulating, muscle, sheath).  Patient will benefit from continued PLS to assist with debridement and cleansing of the wound.  Pt does present with epibole at perimeter of wound which will prevent wound from appropriate healing. Wound Therapy - Functional Problem List: L1 paraplegia, impaired sensation Factors Delaying/Impairing Wound Healing: Altered sensation, Diabetes Mellitus, Immobility, Infection - systemic/local Hydrotherapy Plan: Debridement, Dressing change, Patient/family education, Pulsatile lavage with suction Wound Therapy -  Frequency: 6X / week Wound Therapy - Current Recommendations: PT Wound Therapy - Follow Up Recommendations: dressing changes by RN, dressing changes by family/patient  Wound Therapy Goals- Improve the function of patient's integumentary system by progressing the wound(s) through the phases of wound healing (inflammation - proliferation - remodeling) by: Wound Therapy Goals - Improve the function of patient's integumentary system by progressing the wound(s) through the phases of wound healing by: Decrease Necrotic Tissue to: 25 Decrease Necrotic Tissue - Progress: Progressing toward goal Increase Granulation Tissue to: 75 Increase Granulation Tissue - Progress: Progressing toward goal Improve Drainage Characteristics: Min Improve Drainage Characteristics - Progress: Progressing toward goal Patient/Family will be able to : reposition, perform dressing change Patient/Family Instruction Goal - Progress: Progressing toward goal Goals/treatment plan/discharge plan were made with and agreed upon by patient/family: Yes Time For Goal Achievement: 2 weeks Wound Therapy - Potential for Goals: Good  Goals will be updated until maximal potential achieved or discharge criteria met.  Discharge criteria: when goals achieved, discharge from hospital, MD decision/surgical intervention, no progress towards goals, refusal/missing three consecutive treatments without notification or medical reason.  GP     Charges PT Wound Care Charges $Wound Debridement up to 20 cm: < or equal to 20 cm $PT Hydrotherapy Dressing: 1 dressing $PT PLS Gun and Tip: 1 Supply $PT Hydrotherapy Visit: 1 Visit       Aveer Bartow,KATHrine E 02/20/2021, 4:24 PM Jannette Spanner PT, DPT Acute Rehabilitation Services Pager: [phone] Office: [phone]
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
## 2021-02-20 NOTE — Progress Notes (Signed)
John Figueroa was in his bed with the covers pulled up when I arrived, but he was awake and alert.  I provided ministry of listening as he shared about his concerns with going to a facility where they would likely be understaffed.  He feels that he would get better care at home with his mother.  He is willing to go, however he would like to be able to get home soon.  Chaplain Dyanne Carrel, Bcc Pager, [phone] 12:46 PM

## 2021-02-21 MED ORDER — ALPRAZOLAM 0.5 MG PO TABS
0.5000 mg | ORAL_TABLET | Freq: Two times a day (BID) | ORAL | Status: DC | PRN
  Administered 2021-02-21 – 2021-02-24 (×7): 0.5 mg via ORAL
  Filled 2021-02-21 (×7): qty 1
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
## 2021-02-21 NOTE — Progress Notes (Signed)
PHYSICAL THERAPY HYDROTHERAPY TREATMENT NOTE      02/21/21 1200  Subjective Assessment  Subjective  (Im okay)  Patient and Family Stated Goals to get a new WC and cushion  Date of Onset  (present on admission)  Prior Treatments I treated it myself, my primary doctor  Evaluation and Treatment  Evaluation and Treatment Procedures Explained to Patient/Family Yes  Evaluation and Treatment Procedures agreed to  Pressure Injury 02/11/21 Flank Left Stage 3 -  Full thickness tissue loss. Subcutaneous fat may be visible but bone, tendon or muscle are NOT exposed. large open wound on left buttock( post surgical debridement).PT ONLY  Date First Assessed/Time First Assessed: 02/11/21 1510   Location: Flank  Location Orientation: Left  Staging: Stage 3 -  Full thickness tissue loss. Subcutaneous fat may be visible but bone, tendon or muscle are NOT exposed.  Wound Description (Comme...  Dressing Type ABD;Normal saline moist dressing;Gauze (Comment)  Dressing Changed  Dressing Change Frequency Twice a day  State of Healing Early/partial granulation  Site / Wound Assessment Pink;Red;Yellow;Bleeding  % Wound base Red or Granulating 60%  % Wound base Yellow/Fibrinous Exudate 40%  Peri-wound Assessment Induration  Margins Unattached edges (unapproximated) (with some epibole)  Drainage Amount Moderate  Drainage Description Serosanguineous  Treatment Hydrotherapy (Pulse lavage);Packing (Saline gauze);Debridement (Selective)  Hydrotherapy  Pulsed Lavage with Suction (psi) 12 psi  Pulsed Lavage with Suction - Normal Saline Used 1000 mL  Pulsed Lavage Tip Tip with splash shield  Pulsed lavage therapy - wound location left buttock  Selective Debridement  Selective Debridement - Location buttock  Selective Debridement - Tools Used Forceps;Scissors  Selective Debridement - Tissue Removed yellow slough  Wound Therapy - Assess/Plan/Recommendations  Wound Therapy - Clinical Statement Old dressing  adhering to granulation tissue however no bleeding when moistened with saline first.  Debriding tough slough from mid wound. Applied packing to tunnels and opening between what appears muscle.  Tunneling present  in opening near caudal end continuing with beige drainage today. Patient will benefit from continued PLS to assist with debridement and cleansing of the wound.  Pt does present with epibole at perimeter of wound which could prevent wound from appropriate healing.  Wound Therapy - Functional Problem List L1 paraplegia, impaired sensation  Factors Delaying/Impairing Wound Healing Altered sensation;Diabetes Mellitus;Immobility;Infection - systemic/local  Hydrotherapy Plan Debridement;Dressing change;Patient/family education;Pulsatile lavage with suction  Wound Therapy - Frequency 6X / week  Wound Therapy - Current Recommendations PT;Case manager/social work  Wound Therapy - Follow Up Recommendations dressing changes by RN;dressing changes by family/patient  Wound Therapy Goals - Improve the function of patient's integumentary system by progressing the wound(s) through the phases of wound healing by:  Decrease Necrotic Tissue to 25  Decrease Necrotic Tissue - Progress Progressing toward goal  Increase Granulation Tissue to 75  Increase Granulation Tissue - Progress Progressing toward goal  Improve Drainage Characteristics Min  Improve Drainage Characteristics - Progress Progressing toward goal  Goals/treatment plan/discharge plan were made with and agreed upon by patient/family Yes  Time For Goal Achievement 2 weeks  Wound Therapy - Potential for Goals Good     Faye Ramsay, PT Acute Rehabilitation  Office: [phone] Pager: [phone]
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
## 2021-02-23 NOTE — Progress Notes (Signed)
    02/23/21 1300  Hydrotherapy Note.  Subjective Assessment  Subjective patient quiet  Patient and Family Stated Goals to get a new WC and cushion  Date of Onset  (present on admission/debrided)  Prior Treatments I treated it myself, my primary doctor  Evaluation and Treatment  Evaluation and Treatment Procedures Explained to Patient/Family Yes  Evaluation and Treatment Procedures agreed to  Pressure Injury 02/11/21 Flank Left Stage 3 -  Full thickness tissue loss. Subcutaneous fat may be visible but bone, tendon or muscle are NOT exposed. large open wound on left buttock( post surgical debridement).PT ONLY  Date First Assessed/Time First Assessed: 02/11/21 1510   Location: Flank  Location Orientation: Left  Staging: Stage 3 -  Full thickness tissue loss. Subcutaneous fat may be visible but bone, tendon or muscle are NOT exposed.  Wound Description (Comme...  Wound Image    Dressing Type ABD;Normal saline moist dressing;Hydrogel  Dressing Changed  Dressing Change Frequency Twice a day  State of Healing Early/partial granulation  Site / Wound Assessment Granulation tissue;Yellow  % Wound base Red or Granulating 60%  % Wound base Yellow/Fibrinous Exudate 40%  Peri-wound Assessment Induration  Wound Length (cm) 11 cm  Wound Width (cm) 9 cm  Wound Depth (cm) 4 cm  Wound Surface Area (cm^2) 99 cm^2  Wound Volume (cm^3) 396 cm^3  Tunneling (cm) 8  Undermining (cm) 1.5  Margins Epibole (rolled edges)  Drainage Amount Moderate  Drainage Description Serosanguineous  Treatment Debridement (Selective);Hydrotherapy (Pulse lavage);Packing (Saline gauze)  Hydrotherapy  Pulsed Lavage with Suction (psi) 12 psi  Pulsed Lavage with Suction - Normal Saline Used 1000 mL  Pulsed Lavage Tip Tip with splash shield  Pulsed lavage therapy - wound location left buttock  Selective Debridement  Selective Debridement - Location buttock  Selective Debridement - Tools Used Forceps;Scissors  Selective  Debridement - Tissue Removed yellow slough  Wound Therapy - Assess/Plan/Recommendations  Wound Therapy - Clinical Statement right laeral wound continues to have yellow slough, also deeper into  wound between muscle layers, yellow slough noted and debrided. Tunneling atupper end and induration of 4 cm at lower end.. Increased granulation on left side of wounf.  Wound Therapy - Functional Problem List L1 paraplegia, impaired sensation  Factors Delaying/Impairing Wound Healing Altered sensation;Diabetes Mellitus;Immobility;Infection - systemic/local  Hydrotherapy Plan Debridement;Dressing change;Patient/family education;Pulsatile lavage with suction  Wound Therapy - Frequency 6X / week  Wound Therapy - Current Recommendations Case manager/social work  Wound Therapy - Follow Up Recommendations dressing changes by RN;dressing changes by family/patient  Wound Therapy Goals - Improve the function of patient's integumentary system by progressing the wound(s) through the phases of wound healing by:  Decrease Necrotic Tissue to 25  Decrease Necrotic Tissue - Progress Progressing toward goal  Increase Granulation Tissue to 75  Increase Granulation Tissue - Progress Progressing toward goal  Improve Drainage Characteristics Min  Improve Drainage Characteristics - Progress Progressing toward goal  Patient/Family will be able to  reposition, perform dressing change  Patient/Family Instruction Goal - Progress Progressing toward goal  Goals/treatment plan/discharge plan were made with and agreed upon by patient/family Yes  Time For Goal Achievement 2 weeks  Wound Therapy - Potential for Goals Good  Blanchard Kelch PT Acute Rehabilitation Services Pager [phone] Office [phone] Deb, 2 dr. Donnie Coffin and shield [phone].

## 2021-02-23 NOTE — TOC Progression Note (Signed)
Transition of Care [facility]) - Progression Note    Patient Details  Name: John Figueroa MRN: [id number] Date of Birth: Nov 22, 1980  [facility] CM/SW Contact  Geni Bers, RN Phone Number: 02/23/2021, 3:58 PM  Clinical Narrative:    Pt will discharge to [facility] for wound Care. [employer] approved for tomorrow 8.30 to 9/12. Pt agreed and will need COVID test.  Plan discharge in the AM.   Expected Discharge Plan: Home/Self Care Barriers to Discharge: No Barriers Identified  Expected Discharge Plan and Services Expected Discharge Plan: Home/Self Care       Living arrangements for the past 2 months: Skilled Nursing Facility                                       Social Determinants of Health (SDOH) Interventions    Readmission Risk Interventions No flowsheet data found.
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
## 2021-02-23 NOTE — Plan of Care (Signed)
  Problem: Education: Goal: Knowledge of General Education information will improve Description: Including pain rating scale, medication(s)/side effects and non-pharmacologic comfort measures Outcome: Progressing   Problem: Health Behavior/Discharge Planning: Goal: Ability to manage health-related needs will improve Outcome: Progressing   Problem: Clinical Measurements: Goal: Will remain free from infection Outcome: Progressing   

## 2021-02-24 ENCOUNTER — Telehealth: Payer: Self-pay

## 2021-02-24 DIAGNOSIS — F411 Generalized anxiety disorder: Secondary | ICD-10-CM

## 2021-02-24 DIAGNOSIS — G8220 Paraplegia, unspecified: Secondary | ICD-10-CM

## 2021-02-24 DIAGNOSIS — M726 Necrotizing fasciitis: Secondary | ICD-10-CM

## 2021-02-24 LAB — SARS CORONAVIRUS 2 (TAT 6-24 HRS): SARS Coronavirus 2: NEGATIVE

## 2021-02-24 MED ORDER — ZINC SULFATE 220 (50 ZN) MG PO CAPS: 220.0000 mg | ORAL_CAPSULE | Freq: Every day | ORAL | 0 refills | Status: AC

## 2021-02-24 MED ORDER — TAB-A-VITE/IRON PO TABS: 1.0000 | ORAL_TABLET | Freq: Every day | ORAL | 0 refills | Status: AC

## 2021-02-24 MED ORDER — ASCORBIC ACID 500 MG PO TABS: 500.0000 mg | ORAL_TABLET | Freq: Two times a day (BID) | ORAL | 0 refills | Status: AC

## 2021-02-24 MED ORDER — FLUOXETINE HCL 10 MG PO CAPS: 10.0000 mg | ORAL_CAPSULE | Freq: Every day | ORAL | 0 refills | Status: AC

## 2021-02-24 MED ORDER — METHOCARBAMOL 500 MG PO TABS: 1000.0000 mg | ORAL_TABLET | Freq: Four times a day (QID) | ORAL | 0 refills | Status: AC | PRN

## 2021-02-24 MED ORDER — CALCIUM POLYCARBOPHIL 625 MG PO TABS: 625.0000 mg | ORAL_TABLET | Freq: Two times a day (BID) | ORAL | 0 refills | Status: AC

## 2021-02-24 MED ORDER — ALPRAZOLAM 0.5 MG PO TABS: 0.5000 mg | ORAL_TABLET | Freq: Two times a day (BID) | ORAL | 0 refills | Status: DC | PRN

## 2021-02-24 MED ORDER — LINEZOLID 600 MG PO TABS: 600.0000 mg | ORAL_TABLET | Freq: Two times a day (BID) | ORAL | 0 refills | Status: DC

## 2021-02-24 MED ORDER — THIAMINE HCL 100 MG PO TABS: 100.0000 mg | ORAL_TABLET | Freq: Every day | ORAL | 0 refills | Status: AC

## 2021-02-24 MED ORDER — AMOXICILLIN-POT CLAVULANATE 875-125 MG PO TABS: 1.0000 | ORAL_TABLET | Freq: Two times a day (BID) | ORAL | 0 refills | Status: DC

## 2021-02-24 NOTE — TOC Progression Note (Signed)
Transition of Care [facility]) - Progression Note    Patient Details  Name: John Figueroa MRN: [id number] Date of Birth: 01-Oct-1980  [facility] CM/SW Contact  Geni Bers, RN Phone Number: 02/24/2021, 3:05 PM  Clinical Narrative:    A call from Admission Coordinator was received to reveal that pt was there at [facility] for one hour, discharged AMA. Pt's mother said she could take him home, take care of pt.    Expected Discharge Plan: Home/Self Care Barriers to Discharge: No Barriers Identified  Expected Discharge Plan and Services Expected Discharge Plan: Home/Self Care       Living arrangements for the past 2 months: Skilled Nursing Facility Expected Discharge Date: 02/24/21                                     Social Determinants of Health (SDOH) Interventions    Readmission Risk Interventions No flowsheet data found.
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
## 2021-02-24 NOTE — Progress Notes (Signed)
   02/24/21 1427 Hydrotherapy note  Subjective Assessment  Subjective patient states he has not heard if he is leaving  Patient and Family Stated Goals to get a new WC and cushion  Date of Onset  (present on admission/debrided)  Prior Treatments I treated it myself, my primary doctor  Evaluation and Treatment  Evaluation and Treatment Procedures Explained to Patient/Family Yes  Evaluation and Treatment Procedures agreed to  Pressure Injury 02/11/21 Flank Left Stage 3 -  Full thickness tissue loss. Subcutaneous fat may be visible but bone, tendon or muscle are NOT exposed. large open wound on left buttock( post surgical debridement).PT ONLY  Date First Assessed/Time First Assessed: 02/11/21 1510   Location: Flank  Location Orientation: Left  Staging: Stage 3 -  Full thickness tissue loss. Subcutaneous fat may be visible but bone, tendon or muscle are NOT exposed.  Wound Description (Comme...  Dressing Type ABD;Normal saline moist dressing  Dressing Changed  Dressing Change Frequency Twice a day  State of Healing Early/partial granulation  Site / Wound Assessment Granulation tissue;Dusky;Yellow  % Wound base Red or Granulating 60%  % Wound base Yellow/Fibrinous Exudate 40%  Peri-wound Assessment Induration  Wound Length (cm) 11 cm  Wound Width (cm) 9 cm  Wound Depth (cm) 4 cm (3 more CM between muscle layers)  Wound Surface Area (cm^2) 99 cm^2  Wound Volume (cm^3) 396 cm^3  Undermining (cm) 1.5  Margins Epibole (rolled edges)  Drainage Amount Moderate  Drainage Description Serosanguineous  Treatment Debridement (Selective);Hydrotherapy (Pulse lavage);Packing (Saline gauze) (hydrogel to granulation)  Hydrotherapy  Pulsed Lavage with Suction (psi) 12 psi  Pulsed Lavage with Suction - Normal Saline Used 1000 mL  Pulsed Lavage Tip Tip with splash shield  Pulsed lavage therapy - wound location left buttock  Selective Debridement  Selective Debridement - Location buttock  Selective  Debridement - Tools Used Forceps;Scissors  Selective Debridement - Tissue Removed yellow slough  Wound Therapy - Assess/Plan/Recommendations  Wound Therapy - Clinical Statement right lateral wound continues to have yellow slough, also deeper into  wound between muscle layers, yellow slough noted and debrided. Tunneling at upper end and induration of 4 cm at lower end.. Increased granulation on left side of wound.  Wound Therapy - Functional Problem List L1 paraplegia, impaired sensation  Factors Delaying/Impairing Wound Healing Altered sensation;Diabetes Mellitus;Immobility;Infection - systemic/local  Hydrotherapy Plan Debridement;Dressing change;Patient/family education;Pulsatile lavage with suction  Wound Therapy - Frequency 6X / week  Wound Therapy - Current Recommendations Case manager/social work  Wound Therapy - Follow Up Recommendations dressing changes by RN;dressing changes by family/patient  Wound Therapy Goals - Improve the function of patient's integumentary system by progressing the wound(s) through the phases of wound healing by:  Decrease Necrotic Tissue to 25  Decrease Necrotic Tissue - Progress Progressing toward goal  Increase Granulation Tissue to 75  Increase Granulation Tissue - Progress Progressing toward goal  Improve Drainage Characteristics Min  Improve Drainage Characteristics - Progress Progressing toward goal  Patient/Family will be able to  reposition, perform dressing change  Patient/Family Instruction Goal - Progress Progressing toward goal  Goals/treatment plan/discharge plan were made with and agreed upon by patient/family Yes  Time For Goal Achievement 2 weeks  Wound Therapy - Potential for Goals Good  Blanchard Kelch PT Acute Rehabilitation Services Pager [phone] Office [phone] Deb >20, dr x 1 [phone]- [phone]

## 2021-02-24 NOTE — Plan of Care (Signed)
  Problem: Education: Goal: Knowledge of General Education information will improve Description: Including pain rating scale, medication(s)/side effects and non-pharmacologic comfort measures Outcome: Progressing   Problem: Health Behavior/Discharge Planning: Goal: Ability to manage health-related needs will improve Outcome: Progressing   Problem: Clinical Measurements: Goal: Will remain free from infection Outcome: Progressing   

## 2021-02-24 NOTE — TOC Progression Note (Signed)
Transition of Care [facility]) - Progression Note    Patient Details  Name: WANG GRANADA MRN: [id number] Date of Birth: August 23, 1980  Transition [facility]) CM/SW Contact  Geni Bers, RN Phone Number: 02/24/2021, 12:54 PM  Clinical Narrative:    Sharin Mons was called. Pt will transport to [facility] and was okay with going.    Expected Discharge Plan: Home/Self Care Barriers to Discharge: No Barriers Identified  Expected Discharge Plan and Services Expected Discharge Plan: Home/Self Care       Living arrangements for the past 2 months: Skilled Nursing Facility Expected Discharge Date: 02/24/21                                     Social Determinants of Health (SDOH) Interventions    Readmission Risk Interventions No flowsheet data found.

## 2021-02-24 NOTE — TOC Progression Note (Signed)
Transition of Care [facility]) - Progression Note    Patient Details  Name: JAVON SNEE MRN: [id number] Date of Birth: 05-27-1981  Transition of Care [facility]) CM/SW Contact  Geni Bers, RN Phone Number: 02/24/2021, 3:14 PM  Clinical Narrative:    Pt called to ask if [facility] could still follow him at home. A call to [facility] was made and will follow pt at home.    Expected Discharge Plan: Home/Self Care Barriers to Discharge: No Barriers Identified  Expected Discharge Plan and Services Expected Discharge Plan: Home/Self Care       Living arrangements for the past 2 months: Skilled Nursing Facility Expected Discharge Date: 02/24/21                                     Social Determinants of Health (SDOH) Interventions    Readmission Risk Interventions No flowsheet data found.

## 2021-02-24 NOTE — Progress Notes (Signed)
Provided discharge instructions. IV removed intact.  Val Eagle

## 2021-02-24 NOTE — Progress Notes (Signed)
Attempted to call report to [facility]. Left a message. At 1:28pm Val Eagle

## 2021-02-26 ENCOUNTER — Other Ambulatory Visit: Payer: Self-pay

## 2021-02-26 ENCOUNTER — Other Ambulatory Visit: Payer: Self-pay | Admitting: Internal Medicine

## 2021-02-26 ENCOUNTER — Ambulatory Visit: Payer: Managed Care, Other (non HMO) | Admitting: Internal Medicine

## 2021-02-26 ENCOUNTER — Encounter: Payer: Self-pay | Admitting: Internal Medicine

## 2021-02-26 VITALS — BP 126/78 | HR 82 | Temp 98.5°F

## 2021-02-26 DIAGNOSIS — M726 Necrotizing fasciitis: Secondary | ICD-10-CM | POA: Diagnosis not present

## 2021-02-26 NOTE — Addendum Note (Signed)
Addended by: Corwin Levins on: 02/26/2021 03:30 PM   Modules accepted: Orders

## 2021-02-26 NOTE — Patient Instructions (Signed)
Your wound doesn't appear to have further infection  No need for further antibiotics at this time   It is very important you work on your nutrition and get very good wound care. We need the wound to close otherwise you are predisposed to future infection   Follow up with your wound care clinic   No further follow up with id clinic needed at this time

## 2021-02-26 NOTE — Telephone Encounter (Signed)
HH and DME bed done
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
## 2021-02-26 NOTE — Telephone Encounter (Signed)
Order has been faxed

## 2021-03-04 ENCOUNTER — Encounter: Payer: Self-pay | Admitting: Internal Medicine

## 2021-03-04 ENCOUNTER — Other Ambulatory Visit: Payer: Self-pay

## 2021-03-04 ENCOUNTER — Ambulatory Visit: Payer: Managed Care, Other (non HMO) | Admitting: Internal Medicine

## 2021-03-04 VITALS — BP 122/70 | HR 92 | Ht 78.0 in | Wt 200.0 lb

## 2021-03-04 DIAGNOSIS — I10 Essential (primary) hypertension: Secondary | ICD-10-CM

## 2021-03-04 DIAGNOSIS — F331 Major depressive disorder, recurrent, moderate: Secondary | ICD-10-CM | POA: Diagnosis not present

## 2021-03-04 DIAGNOSIS — R739 Hyperglycemia, unspecified: Secondary | ICD-10-CM | POA: Diagnosis not present

## 2021-03-04 DIAGNOSIS — M726 Necrotizing fasciitis: Secondary | ICD-10-CM

## 2021-03-04 MED ORDER — LINEZOLID 600 MG PO TABS: 600.0000 mg | ORAL_TABLET | Freq: Two times a day (BID) | ORAL | 0 refills | Status: DC

## 2021-03-04 MED ORDER — AMOXICILLIN-POT CLAVULANATE 875-125 MG PO TABS
1.0000 | ORAL_TABLET | Freq: Two times a day (BID) | ORAL | 0 refills | Status: DC
Start: 2021-03-04 — End: 2021-03-20

## 2021-03-04 MED ORDER — TRAMADOL HCL 50 MG PO TABS: 50.0000 mg | ORAL_TABLET | Freq: Four times a day (QID) | ORAL | 0 refills | Status: DC | PRN
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
## 2021-03-04 NOTE — Patient Instructions (Signed)
Please take all new medication as prescribed - the antibiotic, and the pain medication  Please continue all other medications as before, and refills have been done if requested.  Please have the pharmacy call with any other refills you may need.  Please keep your appointments with your specialists as you may have planned  Please make an Appointment to return in 1 month

## 2021-03-05 ENCOUNTER — Telehealth: Payer: Self-pay

## 2021-03-05 NOTE — Telephone Encounter (Signed)
PA needed for Linezolid and submitted to plan.  Key: [id number]

## 2021-03-07 ENCOUNTER — Encounter: Payer: Self-pay | Admitting: Internal Medicine

## 2021-03-07 NOTE — Assessment & Plan Note (Signed)
For restart zyvox and augmentin as intended post hospn, and HH to start with wound care

## 2021-03-07 NOTE — Assessment & Plan Note (Signed)
Lab Results  Component Value Date   HGBA1C 5.5 01/16/2021   Stable, pt to continue current medical treatment  - diet

## 2021-03-07 NOTE — Assessment & Plan Note (Signed)
Declines prozac for now,  to f/u any worsening symptoms or concerns

## 2021-03-07 NOTE — Assessment & Plan Note (Signed)
Stable for now, declines med restart for now, to f/u 1 mo

## 2021-03-13 ENCOUNTER — Telehealth: Payer: Self-pay | Admitting: Internal Medicine

## 2021-03-13 NOTE — Telephone Encounter (Signed)
Ok for verbal 

## 2021-03-13 NOTE — Telephone Encounter (Signed)
Maralyn Sago calling in to request verbal order  Increase home health visits to 2x weekly for 6 weeks

## 2021-03-16 NOTE — Telephone Encounter (Signed)
Verbals given  

## 2021-03-17 ENCOUNTER — Emergency Department (HOSPITAL_COMMUNITY): Payer: Managed Care, Other (non HMO)

## 2021-03-17 ENCOUNTER — Inpatient Hospital Stay (HOSPITAL_COMMUNITY)
Admission: EM | Admit: 2021-03-17 | Discharge: 2021-03-20 | DRG: 871 | Disposition: A | Discharge status: Home Health | Payer: Managed Care, Other (non HMO) | Attending: Family Medicine | Admitting: Family Medicine

## 2021-03-17 ENCOUNTER — Telehealth: Payer: Self-pay

## 2021-03-17 ENCOUNTER — Encounter (HOSPITAL_COMMUNITY): Payer: Self-pay | Admitting: Emergency Medicine

## 2021-03-17 DIAGNOSIS — N3090 Cystitis, unspecified without hematuria: Secondary | ICD-10-CM | POA: Diagnosis present

## 2021-03-17 DIAGNOSIS — L03317 Cellulitis of buttock: Secondary | ICD-10-CM | POA: Diagnosis present

## 2021-03-17 DIAGNOSIS — Z86718 Personal history of other venous thrombosis and embolism: Secondary | ICD-10-CM

## 2021-03-17 DIAGNOSIS — D638 Anemia in other chronic diseases classified elsewhere: Secondary | ICD-10-CM | POA: Diagnosis present

## 2021-03-17 DIAGNOSIS — Z79899 Other long term (current) drug therapy: Secondary | ICD-10-CM

## 2021-03-17 DIAGNOSIS — M4628 Osteomyelitis of vertebra, sacral and sacrococcygeal region: Secondary | ICD-10-CM | POA: Diagnosis present

## 2021-03-17 DIAGNOSIS — M609 Myositis, unspecified: Secondary | ICD-10-CM | POA: Diagnosis present

## 2021-03-17 DIAGNOSIS — L89324 Pressure ulcer of left buttock, stage 4: Secondary | ICD-10-CM | POA: Diagnosis present

## 2021-03-17 DIAGNOSIS — Y92009 Unspecified place in unspecified non-institutional (private) residence as the place of occurrence of the external cause: Secondary | ICD-10-CM | POA: Diagnosis not present

## 2021-03-17 DIAGNOSIS — M8668 Other chronic osteomyelitis, other site: Secondary | ICD-10-CM | POA: Diagnosis present

## 2021-03-17 DIAGNOSIS — T3695XA Adverse effect of unspecified systemic antibiotic, initial encounter: Secondary | ICD-10-CM | POA: Diagnosis present

## 2021-03-17 DIAGNOSIS — F419 Anxiety disorder, unspecified: Secondary | ICD-10-CM | POA: Diagnosis present

## 2021-03-17 DIAGNOSIS — A419 Sepsis, unspecified organism: Secondary | ICD-10-CM | POA: Diagnosis present

## 2021-03-17 DIAGNOSIS — A498 Other bacterial infections of unspecified site: Secondary | ICD-10-CM | POA: Diagnosis not present

## 2021-03-17 DIAGNOSIS — Z20822 Contact with and (suspected) exposure to covid-19: Secondary | ICD-10-CM | POA: Diagnosis present

## 2021-03-17 DIAGNOSIS — A4902 Methicillin resistant Staphylococcus aureus infection, unspecified site: Secondary | ICD-10-CM | POA: Diagnosis not present

## 2021-03-17 DIAGNOSIS — E876 Hypokalemia: Secondary | ICD-10-CM | POA: Diagnosis present

## 2021-03-17 DIAGNOSIS — M86652 Other chronic osteomyelitis, left thigh: Secondary | ICD-10-CM | POA: Diagnosis not present

## 2021-03-17 DIAGNOSIS — M726 Necrotizing fasciitis: Secondary | ICD-10-CM

## 2021-03-17 DIAGNOSIS — F32A Depression, unspecified: Secondary | ICD-10-CM | POA: Diagnosis present

## 2021-03-17 DIAGNOSIS — K521 Toxic gastroenteritis and colitis: Secondary | ICD-10-CM | POA: Diagnosis present

## 2021-03-17 DIAGNOSIS — M8610 Other acute osteomyelitis, unspecified site: Secondary | ICD-10-CM | POA: Diagnosis not present

## 2021-03-17 DIAGNOSIS — G8220 Paraplegia, unspecified: Secondary | ICD-10-CM | POA: Diagnosis present

## 2021-03-17 DIAGNOSIS — Z87440 Personal history of urinary (tract) infections: Secondary | ICD-10-CM

## 2021-03-17 DIAGNOSIS — Z8739 Personal history of other diseases of the musculoskeletal system and connective tissue: Secondary | ICD-10-CM

## 2021-03-17 DIAGNOSIS — M869 Osteomyelitis, unspecified: Secondary | ICD-10-CM

## 2021-03-17 DIAGNOSIS — N179 Acute kidney failure, unspecified: Secondary | ICD-10-CM | POA: Diagnosis present

## 2021-03-17 DIAGNOSIS — W3400XA Accidental discharge from unspecified firearms or gun, initial encounter: Secondary | ICD-10-CM | POA: Diagnosis not present

## 2021-03-17 DIAGNOSIS — A429 Actinomycosis, unspecified: Secondary | ICD-10-CM | POA: Diagnosis not present

## 2021-03-17 LAB — COMPREHENSIVE METABOLIC PANEL
ALT: 15 U/L (ref 0–44)
AST: 26 U/L (ref 15–41)
Albumin: 2.8 g/dL — ABNORMAL LOW (ref 3.5–5.0)
Alkaline Phosphatase: 69 U/L (ref 38–126)
Anion gap: 13 (ref 5–15)
BUN: 23 mg/dL — ABNORMAL HIGH (ref 6–20)
CO2: 23 mmol/L (ref 22–32)
Calcium: 8.9 mg/dL (ref 8.9–10.3)
Chloride: 98 mmol/L (ref 98–111)
Creatinine, Ser: 1.29 mg/dL — ABNORMAL HIGH (ref 0.61–1.24)
GFR, Estimated: >60 mL/min (ref >60)
Glucose, Bld: 118 mg/dL — ABNORMAL HIGH (ref 70–99)
Potassium: 2.6 mmol/L — CL (ref 3.5–5.1)
Sodium: 134 mmol/L — ABNORMAL LOW (ref 135–145)
Total Bilirubin: 0.5 mg/dL (ref 0.3–1.2)
Total Protein: 9.4 g/dL — ABNORMAL HIGH (ref 6.5–8.1)

## 2021-03-17 LAB — PROTIME-INR
INR: 1.3 — ABNORMAL HIGH (ref 0.8–1.2)
Prothrombin Time: 16 seconds — ABNORMAL HIGH (ref 11.4–15.2)

## 2021-03-17 LAB — URINALYSIS, ROUTINE W REFLEX MICROSCOPIC
Glucose, UA: NEGATIVE mg/dL
Hgb urine dipstick: NEGATIVE
Ketones, ur: NEGATIVE mg/dL
Nitrite: NEGATIVE
Protein, ur: 30 mg/dL — AB
Specific Gravity, Urine: 1.02 (ref 1.005–1.030)
pH: 6 (ref 5.0–8.0)

## 2021-03-17 LAB — CBC WITH DIFFERENTIAL/PLATELET
Abs Immature Granulocytes: 0.91 10*3/uL — ABNORMAL HIGH (ref 0.00–0.07)
Basophils Absolute: 0.1 10*3/uL (ref 0.0–0.1)
Basophils Relative: 0 %
Eosinophils Absolute: 0.1 10*3/uL (ref 0.0–0.5)
Eosinophils Relative: 0 %
HCT: 27.4 % — ABNORMAL LOW (ref 39.0–52.0)
Hemoglobin: 8.7 g/dL — ABNORMAL LOW (ref 13.0–17.0)
Immature Granulocytes: 3 %
Lymphocytes Relative: 5 %
Lymphs Abs: 1.6 10*3/uL (ref 0.7–4.0)
MCH: 28.2 pg (ref 26.0–34.0)
MCHC: 31.8 g/dL (ref 30.0–36.0)
MCV: 88.7 fL (ref 80.0–100.0)
Monocytes Absolute: 1.6 10*3/uL — ABNORMAL HIGH (ref 0.1–1.0)
Monocytes Relative: 5 %
Neutro Abs: 30.4 10*3/uL — ABNORMAL HIGH (ref 1.7–7.7)
Neutrophils Relative %: 87 %
Platelets: 722 10*3/uL — ABNORMAL HIGH (ref 150–400)
RBC: 3.09 MIL/uL — ABNORMAL LOW (ref 4.22–5.81)
RDW: 18.3 % — ABNORMAL HIGH (ref 11.5–15.5)
WBC: 34.8 10*3/uL — ABNORMAL HIGH (ref 4.0–10.5)
nRBC: 0 % (ref 0.0–0.2)

## 2021-03-17 LAB — APTT: aPTT: 32 seconds (ref 24–36)

## 2021-03-17 LAB — LACTIC ACID, PLASMA: Lactic Acid, Venous: 1.4 mmol/L (ref 0.5–1.9)

## 2021-03-17 LAB — URINALYSIS, MICROSCOPIC (REFLEX)

## 2021-03-17 LAB — SEDIMENTATION RATE: Sed Rate: >140 mm/hr — ABNORMAL HIGH (ref 0–16)

## 2021-03-17 LAB — C-REACTIVE PROTEIN: CRP: 19 mg/dL — ABNORMAL HIGH (ref <1.0)

## 2021-03-17 LAB — RESP PANEL BY RT-PCR (FLU A&B, COVID) ARPGX2
Influenza A by PCR: NEGATIVE
Influenza B by PCR: NEGATIVE
SARS Coronavirus 2 by RT PCR: NEGATIVE

## 2021-03-17 LAB — MAGNESIUM: Magnesium: 1.8 mg/dL (ref 1.7–2.4)

## 2021-03-17 MED ORDER — ZINC SULFATE 220 (50 ZN) MG PO CAPS
220.0000 mg | ORAL_CAPSULE | Freq: Every day | ORAL | Status: DC
  Administered 2021-03-19: 220 mg via ORAL
  Filled 2021-03-17 (×3): qty 1

## 2021-03-17 MED ORDER — CLINDAMYCIN PHOSPHATE 600 MG/50ML IV SOLN
600.0000 mg | Freq: Once | INTRAVENOUS | Status: AC
  Administered 2021-03-17: 600 mg via INTRAVENOUS
  Filled 2021-03-17: qty 50

## 2021-03-17 MED ORDER — VANCOMYCIN HCL IN DEXTROSE 1-5 GM/200ML-% IV SOLN
1000.0000 mg | Freq: Once | INTRAVENOUS | Status: AC
  Administered 2021-03-17: 1000 mg via INTRAVENOUS
  Filled 2021-03-17: qty 200

## 2021-03-17 MED ORDER — POTASSIUM CHLORIDE 20 MEQ PO PACK
40.0000 meq | PACK | Freq: Once | ORAL | Status: AC
  Administered 2021-03-17: 40 meq via ORAL
  Filled 2021-03-17: qty 2

## 2021-03-17 MED ORDER — TRAZODONE HCL 50 MG PO TABS
25.0000 mg | ORAL_TABLET | Freq: Every evening | ORAL | Status: DC | PRN
  Administered 2021-03-18: 25 mg via ORAL
  Filled 2021-03-17: qty 1

## 2021-03-17 MED ORDER — KETOROLAC TROMETHAMINE 15 MG/ML IJ SOLN: 15.0000 mg | Freq: Four times a day (QID) | INTRAMUSCULAR | Status: DC | PRN

## 2021-03-17 MED ORDER — ACETAMINOPHEN 650 MG RE SUPP: 650.0000 mg | Freq: Four times a day (QID) | RECTAL | Status: DC | PRN

## 2021-03-17 MED ORDER — ACETAMINOPHEN 325 MG PO TABS
650.0000 mg | ORAL_TABLET | Freq: Once | ORAL | Status: AC | PRN
  Administered 2021-03-17: 650 mg via ORAL
  Filled 2021-03-17: qty 2

## 2021-03-17 MED ORDER — IOHEXOL 350 MG/ML SOLN
80.0000 mL | Freq: Once | INTRAVENOUS | Status: AC | PRN
  Administered 2021-03-17: 80 mL via INTRAVENOUS

## 2021-03-17 MED ORDER — ALPRAZOLAM 0.5 MG PO TABS: 0.5000 mg | ORAL_TABLET | Freq: Two times a day (BID) | ORAL | Status: DC | PRN

## 2021-03-17 MED ORDER — LACTATED RINGERS IV BOLUS
30.0000 mL/kg | Freq: Once | INTRAVENOUS | Status: AC
  Administered 2021-03-17: 2313 mL via INTRAVENOUS

## 2021-03-17 MED ORDER — ENOXAPARIN SODIUM 40 MG/0.4ML IJ SOSY
40.0000 mg | PREFILLED_SYRINGE | INTRAMUSCULAR | Status: DC
  Administered 2021-03-18 – 2021-03-19 (×2): 40 mg via SUBCUTANEOUS
  Filled 2021-03-17 (×3): qty 0.4

## 2021-03-17 MED ORDER — MORPHINE SULFATE (PF) 2 MG/ML IV SOLN
2.0000 mg | INTRAVENOUS | Status: DC | PRN
  Administered 2021-03-18: 2 mg via INTRAVENOUS
  Filled 2021-03-17: qty 1

## 2021-03-17 MED ORDER — PIPERACILLIN-TAZOBACTAM 3.375 G IVPB
3.3750 g | Freq: Three times a day (TID) | INTRAVENOUS | Status: DC
  Administered 2021-03-18 – 2021-03-19 (×4): 3.375 g via INTRAVENOUS
  Filled 2021-03-17 (×4): qty 50

## 2021-03-17 MED ORDER — ONDANSETRON HCL 4 MG PO TABS: 4.0000 mg | ORAL_TABLET | Freq: Four times a day (QID) | ORAL | Status: DC | PRN

## 2021-03-17 MED ORDER — CALCIUM POLYCARBOPHIL 625 MG PO TABS
625.0000 mg | ORAL_TABLET | Freq: Two times a day (BID) | ORAL | Status: DC
  Administered 2021-03-18 – 2021-03-19 (×2): 625 mg via ORAL
  Filled 2021-03-17 (×7): qty 1

## 2021-03-17 MED ORDER — MAGNESIUM HYDROXIDE 400 MG/5ML PO SUSP
30.0000 mL | Freq: Every day | ORAL | Status: DC | PRN
Start: 2021-03-17 — End: 2021-03-20

## 2021-03-17 MED ORDER — ACETAMINOPHEN 325 MG PO TABS
650.0000 mg | ORAL_TABLET | Freq: Four times a day (QID) | ORAL | Status: DC | PRN
  Administered 2021-03-18 – 2021-03-19 (×2): 650 mg via ORAL
  Filled 2021-03-17 (×2): qty 2

## 2021-03-17 MED ORDER — ALPRAZOLAM 1 MG PO TABS: 1.0000 mg | ORAL_TABLET | Freq: Two times a day (BID) | ORAL | Status: DC | PRN

## 2021-03-17 MED ORDER — POTASSIUM CHLORIDE 20 MEQ PO PACK: 40.0000 meq | PACK | Freq: Once | ORAL | Status: DC

## 2021-03-17 MED ORDER — VANCOMYCIN HCL IN DEXTROSE 1-5 GM/200ML-% IV SOLN: 1000.0000 mg | Freq: Once | INTRAVENOUS | Status: DC

## 2021-03-17 MED ORDER — VANCOMYCIN HCL IN DEXTROSE 1-5 GM/200ML-% IV SOLN
1000.0000 mg | Freq: Two times a day (BID) | INTRAVENOUS | Status: DC
  Administered 2021-03-18: 1000 mg via INTRAVENOUS
  Filled 2021-03-17: qty 200

## 2021-03-17 MED ORDER — TRAMADOL HCL 50 MG PO TABS
50.0000 mg | ORAL_TABLET | Freq: Four times a day (QID) | ORAL | Status: DC | PRN
Start: 2021-03-17 — End: 2021-03-20

## 2021-03-17 MED ORDER — POTASSIUM CHLORIDE IN NACL 40-0.9 MEQ/L-% IV SOLN
INTRAVENOUS | Status: DC
  Filled 2021-03-17 (×4): qty 1000

## 2021-03-17 MED ORDER — PIPERACILLIN-TAZOBACTAM 3.375 G IVPB 30 MIN
3.3750 g | Freq: Once | INTRAVENOUS | Status: AC
  Administered 2021-03-17: 3.375 g via INTRAVENOUS
  Filled 2021-03-17: qty 50

## 2021-03-17 MED ORDER — ONDANSETRON HCL 4 MG/2ML IJ SOLN
4.0000 mg | Freq: Four times a day (QID) | INTRAMUSCULAR | Status: DC | PRN
  Filled 2021-03-17: qty 2
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
## 2021-03-17 NOTE — Progress Notes (Signed)
Pharmacy Antibiotic Note  John Figueroa is a 40 y.o. male admitted on 03/17/2021 with recent discharge from necrotizing fasciitis and Fournier's gangrene returns today for fever and tachycardia..  Pharmacy has been consulted for vancomycin dosing.  Plan: Vancomycin 1gm IV q12h (AUC 500.9, Scr 1.29, TBW) ZEI per MD Daily Scr Follow renal function and clinical course, vanc levels as needed  Height: 6\' 6"  (198.1 cm) Weight: 77.1 kg (170 lb) IBW/kg (Calculated) : 91.4  Temp (24hrs), Avg:101.3 F (38.5 C), Min:99.8 F (37.7 C), Max:102.8 F (39.3 C)  Recent Labs  Lab 03/17/21 1610  WBC 34.8*  CREATININE 1.29*  LATICACIDVEN 1.4    Estimated Creatinine Clearance: 81.4 mL/min (A) (by C-G formula based on SCr of 1.29 mg/dL (H)).    No Known Allergies  Antimicrobials this admission: 9/20 vanc >> 9/20 zosyn >>  Dose adjustments this admission:  Microbiology results: 9/20 BCx:  9/20 UCx:   Thank you for allowing pharmacy to be a part of this patient's care.  10/20 RPh 03/17/2021, 10:22 PM

## 2021-03-17 NOTE — Telephone Encounter (Signed)
FYI: Katie from [facility] has stated Pt wound is getting worse a/w pt having a  Temp of 101.8 and HR of 108, Fatigue, oder from wound which is new and creamy drainage.  *Florentina Addison has advised pt to go to the ED due to above sxs.

## 2021-03-17 NOTE — ED Triage Notes (Signed)
Pt sent in by his [facility] nurse who stated that his wound was infected. Pt is febrile at 102.8. Hx of wound on sacrum x 1.5 months. Has had 'emergency surgery' to debride this . Alert and oriented.

## 2021-03-17 NOTE — ED Provider Notes (Signed)
Emergency Medicine Provider Triage Evaluation Note  John Figueroa , a 40 y.o. male  was evaluated in triage.  Pt complains of fevers.  He recently was admitted for necrotizing fasciitis and discharged on the 30th.  Had this from a sacral wound.  He reports he has been taking his antibiotics.  He had a home health nurse come today and he was febrile and tachycardic and sent him here.  Review of Systems  Positive: Fevers Negative: Syncope  Physical Exam  BP 123/77 (BP Location: Left Arm)   Pulse (!) 113   Temp (!) 102.8 F (39.3 C) (Oral) Comment: nurse made aware  Resp 16   Ht 6\' 6"  (1.981 m)   Wt 77.1 kg   SpO2 98%   BMI 19.65 kg/m  Gen:   Awake, no distress   Resp:  Normal effort  MSK:   Able to turn in his wheelchair without difficulty Other:  Awake and alert, generally well-appearing  Medical Decision Making  Medically screening exam initiated at 3:52 PM.  Appropriate orders placed.  John Figueroa was informed that the remainder of the evaluation will be completed by another provider, this initial triage assessment does not replace that evaluation, and the importance of remaining in the ED until their evaluation is complete.  Patient with recent discharge from necrotizing fasciitis and Fournier's gangrene returns today for fever and tachycardia.  Labs, blood cultures, and lactic is ordered.  He self caths and needs to self cath so we will send a urine and urine culture.   Yvonna Alanis, PA-C 03/17/21 1556    03/19/21, DO 03/17/21 1620

## 2021-03-18 ENCOUNTER — Other Ambulatory Visit: Payer: Self-pay

## 2021-03-18 DIAGNOSIS — M8610 Other acute osteomyelitis, unspecified site: Secondary | ICD-10-CM

## 2021-03-18 LAB — CBC
HCT: 24.4 % — ABNORMAL LOW (ref 39.0–52.0)
Hemoglobin: 7.9 g/dL — ABNORMAL LOW (ref 13.0–17.0)
MCH: 28.2 pg (ref 26.0–34.0)
MCHC: 32.4 g/dL (ref 30.0–36.0)
MCV: 87.1 fL (ref 80.0–100.0)
Platelets: 578 10*3/uL — ABNORMAL HIGH (ref 150–400)
RBC: 2.8 MIL/uL — ABNORMAL LOW (ref 4.22–5.81)
RDW: 17.9 % — ABNORMAL HIGH (ref 11.5–15.5)
WBC: 35.2 10*3/uL — ABNORMAL HIGH (ref 4.0–10.5)
nRBC: 0 % (ref 0.0–0.2)

## 2021-03-18 LAB — BASIC METABOLIC PANEL
Anion gap: 10 (ref 5–15)
BUN: 18 mg/dL (ref 6–20)
CO2: 23 mmol/L (ref 22–32)
Calcium: 8.5 mg/dL — ABNORMAL LOW (ref 8.9–10.3)
Chloride: 101 mmol/L (ref 98–111)
Creatinine, Ser: 1.01 mg/dL (ref 0.61–1.24)
GFR, Estimated: >60 mL/min (ref >60)
Glucose, Bld: 101 mg/dL — ABNORMAL HIGH (ref 70–99)
Potassium: 3.4 mmol/L — ABNORMAL LOW (ref 3.5–5.1)
Sodium: 134 mmol/L — ABNORMAL LOW (ref 135–145)

## 2021-03-18 LAB — PROCALCITONIN: Procalcitonin: 0.38 ng/mL

## 2021-03-18 LAB — PROTIME-INR
INR: 1.4 — ABNORMAL HIGH (ref 0.8–1.2)
Prothrombin Time: 17.1 seconds — ABNORMAL HIGH (ref 11.4–15.2)

## 2021-03-18 LAB — CORTISOL-AM, BLOOD: Cortisol - AM: 16.5 ug/dL (ref 6.7–22.6)

## 2021-03-18 MED ORDER — MAGNESIUM SULFATE 2 GM/50ML IV SOLN
2.0000 g | Freq: Once | INTRAVENOUS | Status: AC
  Administered 2021-03-18: 2 g via INTRAVENOUS
  Filled 2021-03-18: qty 50

## 2021-03-18 MED ORDER — VANCOMYCIN HCL 1500 MG/300ML IV SOLN
1500.0000 mg | Freq: Two times a day (BID) | INTRAVENOUS | Status: DC
  Administered 2021-03-18 – 2021-03-19 (×2): 1500 mg via INTRAVENOUS
  Filled 2021-03-18 (×2): qty 300

## 2021-03-18 MED ORDER — VANCOMYCIN HCL 1250 MG/250ML IV SOLN
1250.0000 mg | Freq: Two times a day (BID) | INTRAVENOUS | Status: DC
  Filled 2021-03-18: qty 250

## 2021-03-18 NOTE — Progress Notes (Addendum)
Pharmacy Antibiotic Note  John Figueroa is a 40 y.o. 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 Patient with recent necrotizing fasciitis and Fournier's gangrene s/p antibiotic course and I&D. Pharmacy has been consulted for Vancomycin dosing.  Plan: Adjust Vancomycin to 1500mg  IV q12h  Vancomycin levels at steady state, as indicated Zosyn 3.375g IV q8h (each dose infuses over 4 hours) per MD Monitor renal function (daily SCr), cultures, clinical course, ID recommendations   Height: 6\' 6"  (198.1 cm) Weight: 90.4 kg (199 lb 6.4 oz) IBW/kg (Calculated) : 91.4  Temp (24hrs), Avg:99.6 F (37.6 C), Min:98.9 F (37.2 C), Max:100.1 F (37.8 C)  Recent Labs  Lab 03/17/21 1610 03/18/21 0345 03/18/21 0539  WBC 34.8*  --  35.2*  CREATININE 1.29* 1.01  --   LATICACIDVEN 1.4  --   --      Estimated Creatinine Clearance: 121.8 mL/min (by C-G formula based on SCr of 1.01 mg/dL).    No Known Allergies  Antimicrobials this admission:  9/20 Clindamycin x 1   9/20 Vancomycin >> 9/20 Zosyn >>  Microbiology results: 9/20 BCx:  9/20 UCx:  9/21 BCx:   Thank you for allowing pharmacy to be a part of this patient's care.   10/20, PharmD, BCPS Clinical Pharmacist  03/18/2021, 3:44 PM
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
## 2021-03-18 NOTE — TOC Progression Note (Addendum)
Transition of Care [facility]) - Progression Note    Patient Details  Name: John Figueroa MRN: [id number] Date of Birth: [birth year]  [facility] CM/SW Contact  Geni Bers, RN Phone Number: 03/18/2021, 2:49 PM  Clinical Narrative:    Pt has an appointment at [facility] for 9/28 at 1:15 PM. Pt recently discharged to SNF, signed out AMA from SNF, went to parents home. [facility] followed pt at home.    Expected Discharge Plan: Home w Home Health Services Barriers to Discharge: No Barriers Identified  Expected Discharge Plan and Services Expected Discharge Plan: Home w Home Health Services       Living arrangements for the past 2 months: Single Family Home                                       Social Determinants of Health (SDOH) Interventions    Readmission Risk Interventions No flowsheet data found.

## 2021-03-18 NOTE — Progress Notes (Signed)
Id brief note  Patient paraplegic, chronic sacral ulcer, recent nec fasc of sacral ulcer s/p abx course and I&D, readmitted 9/20 with sepsis and foul smelling sacral ulcer discharge  Bcx in progress  General surgery evaluating patient  On empiric vanc/piptazo    A/p Sepsis Sacral decub ulcer appears infected   -for short time ok to continue vanc/piptazo -f/u bcx -await surgical evaluation -dr Daiva Eves back later today or tomorrow and will formally see

## 2021-03-19 ENCOUNTER — Inpatient Hospital Stay: Payer: Self-pay

## 2021-03-19 DIAGNOSIS — A429 Actinomycosis, unspecified: Secondary | ICD-10-CM

## 2021-03-19 DIAGNOSIS — M86652 Other chronic osteomyelitis, left thigh: Secondary | ICD-10-CM

## 2021-03-19 DIAGNOSIS — A498 Other bacterial infections of unspecified site: Secondary | ICD-10-CM

## 2021-03-19 DIAGNOSIS — W3400XA Accidental discharge from unspecified firearms or gun, initial encounter: Secondary | ICD-10-CM

## 2021-03-19 DIAGNOSIS — A4902 Methicillin resistant Staphylococcus aureus infection, unspecified site: Secondary | ICD-10-CM

## 2021-03-19 DIAGNOSIS — G8220 Paraplegia, unspecified: Secondary | ICD-10-CM

## 2021-03-19 DIAGNOSIS — M8610 Other acute osteomyelitis, unspecified site: Secondary | ICD-10-CM

## 2021-03-19 LAB — BASIC METABOLIC PANEL
Anion gap: 6 (ref 5–15)
BUN: 11 mg/dL (ref 6–20)
CO2: 21 mmol/L — ABNORMAL LOW (ref 22–32)
Calcium: 8.4 mg/dL — ABNORMAL LOW (ref 8.9–10.3)
Chloride: 109 mmol/L (ref 98–111)
Creatinine, Ser: 0.87 mg/dL (ref 0.61–1.24)
GFR, Estimated: >60 mL/min (ref >60)
Glucose, Bld: 91 mg/dL (ref 70–99)
Potassium: 3.6 mmol/L (ref 3.5–5.1)
Sodium: 136 mmol/L (ref 135–145)

## 2021-03-19 LAB — CBC
HCT: 23.4 % — ABNORMAL LOW (ref 39.0–52.0)
Hemoglobin: 7.5 g/dL — ABNORMAL LOW (ref 13.0–17.0)
MCH: 28 pg (ref 26.0–34.0)
MCHC: 32.1 g/dL (ref 30.0–36.0)
MCV: 87.3 fL (ref 80.0–100.0)
Platelets: 520 10*3/uL — ABNORMAL HIGH (ref 150–400)
RBC: 2.68 MIL/uL — ABNORMAL LOW (ref 4.22–5.81)
RDW: 18 % — ABNORMAL HIGH (ref 11.5–15.5)
WBC: 26.5 10*3/uL — ABNORMAL HIGH (ref 4.0–10.5)
nRBC: 0 % (ref 0.0–0.2)

## 2021-03-19 LAB — URINE CULTURE: Culture: 10000 — AB

## 2021-03-19 LAB — MAGNESIUM: Magnesium: 1.9 mg/dL (ref 1.7–2.4)

## 2021-03-19 MED ORDER — SODIUM CHLORIDE 0.9 % IV SOLN
8.0000 mg/kg | Freq: Every day | INTRAVENOUS | Status: DC
  Administered 2021-03-19: 700 mg via INTRAVENOUS
  Filled 2021-03-19: qty 14

## 2021-03-19 MED ORDER — SODIUM CHLORIDE 0.9% FLUSH
10.0000 mL | Freq: Two times a day (BID) | INTRAVENOUS | Status: DC
Start: 2021-03-19 — End: 2021-03-20
  Administered 2021-03-19: 10 mL

## 2021-03-19 MED ORDER — SODIUM CHLORIDE 0.45 % IV SOLN
INTRAVENOUS | Status: DC
  Filled 2021-03-19 (×3): qty 1000

## 2021-03-19 MED ORDER — SODIUM CHLORIDE 0.9% FLUSH: 10.0000 mL | INTRAVENOUS | Status: DC | PRN

## 2021-03-19 MED ORDER — CHLORHEXIDINE GLUCONATE CLOTH 2 % EX PADS
6.0000 | MEDICATED_PAD | Freq: Every day | CUTANEOUS | Status: DC
  Administered 2021-03-19 – 2021-03-20 (×2): 6 via TOPICAL

## 2021-03-19 MED ORDER — SACCHAROMYCES BOULARDII 250 MG PO CAPS
250.0000 mg | ORAL_CAPSULE | Freq: Two times a day (BID) | ORAL | Status: DC
  Administered 2021-03-19 (×2): 250 mg via ORAL
  Filled 2021-03-19 (×3): qty 1

## 2021-03-19 MED ORDER — LOPERAMIDE HCL 2 MG PO CAPS
2.0000 mg | ORAL_CAPSULE | ORAL | Status: DC | PRN
  Administered 2021-03-19 (×2): 2 mg via ORAL
  Filled 2021-03-19: qty 1

## 2021-03-19 MED ORDER — SODIUM CHLORIDE 0.9 % IV SOLN
1.0000 g | INTRAVENOUS | Status: DC
  Administered 2021-03-19 – 2021-03-20 (×2): 1000 mg via INTRAVENOUS
  Filled 2021-03-19 (×2): qty 1
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
## 2021-03-19 NOTE — Progress Notes (Signed)
Peripherally Inserted Central Catheter Placement  The IV Nurse has discussed with the patient and/or persons authorized to consent for the patient, the purpose of this procedure and the potential benefits and risks involved with this procedure.  The benefits include less needle sticks, lab draws from the catheter, and the patient may be discharged home with the catheter. Risks include, but not limited to, infection, bleeding, blood clot (thrombus formation), and puncture of an artery; nerve damage and irregular heartbeat and possibility to perform a PICC exchange if needed/ordered by physician.  Alternatives to this procedure were also discussed.  Bard Power PICC patient education guide, fact sheet on infection prevention and patient information card has been provided to patient /or left at bedside.    PICC Placement Documentation  PICC Single Lumen 03/19/21 Right Basilic 46 cm 0 cm (Active)  Indication for Insertion or Continuance of Line Home intravenous therapies (PICC only) 03/19/21 1800  Exposed Catheter (cm) 0 cm 03/19/21 1800  Site Assessment Clean;Dry;Intact 03/19/21 1800  Line Status Flushed;Saline locked;Blood return noted 03/19/21 1800  Dressing Type Transparent;Securing device 03/19/21 1800  Dressing Status Clean;Dry;Intact 03/19/21 1800  Antimicrobial disc in place? Yes 03/19/21 1800  Safety Lock Not Applicable 03/19/21 1800  Line Care Connections checked and tightened 03/19/21 1800  Dressing Intervention New dressing 03/19/21 1800  Dressing Change Due 03/26/21 03/19/21 1800       Timmothy Sours 03/19/2021, 6:12 PM
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
## 2021-03-19 NOTE — Progress Notes (Signed)
Physical Therapy Wound Evaluation Patient Details  Name: John Figueroa MRN: [id number] Date of Birth: [birth year]  Today's Date: 03/19/2021 Time: 7741-4239 Time Calculation (min): 36 min  Subjective  Subjective Assessment Subjective: Pt agreeable to treatment. Date of Onset:  (present on admission, also had hydrotherapy previous admission) Prior Treatments: hydrotherapy with previous admission, left SNF AMA per notes, has been receiving HHRN for wound care  Pain Score:  No pain  Wound Assessment                          Pressure Injury 03/19/21 Ischial tuberosity Left Stage 4 - Full thickness tissue loss with exposed bone, tendon or muscle. PT Hydrotherapy - left ischial tuberosity/ buttocks  Date First Assessed/Time First Assessed: 03/19/21 0244   Location: Ischial tuberosity  Location Orientation: Left  Staging: Stage 4 - Full thickness tissue loss with exposed bone, tendon or muscle.  Wound Description (Comments): PT Hydrotherapy - left...  Wound Image   Dressing Type ABD;Barrier Film (skin prep);Moist to dry;Normal saline moist dressing  Dressing Changed  Dressing Change Frequency Twice a day  State of Healing Non-healing  Site / Wound Assessment Red;Yellow;Granulation tissue;Brown  % Wound base Red or Granulating 50%  % Wound base Yellow/Fibrinous Exudate 50%  Peri-wound Assessment Intact  Wound Length (cm) 11 cm  Wound Width (cm) 10 cm  Wound Depth (cm) 8 cm (5.5 superior, 7 center, 8 distal)  Wound Surface Area (cm^2) 110 cm^2  Wound Volume (cm^3) 880 cm^3  Margins Epibole (rolled edges)  Drainage Amount Moderate  Drainage Description Purulent;Serosanguineous  Treatment Debridement (Selective);Hydrotherapy (Pulse lavage);Packing (Saline gauze);Off loading                                                                                 Hydrotherapy Pulsed lavage therapy - wound location: left buttock, ischial tuberosity area Pulsed  Lavage with Suction (psi): 10 psi Pulsed Lavage with Suction - Normal Saline Used: 1000 mL Pulsed Lavage Tip: Tip with splash shield Selective Debridement Selective Debridement - Location: left buttock, ischial tuberosity area - necrotic area and depth Selective Debridement - Tools Used: Forceps, Scissors Selective Debridement - Tissue Removed: yellow slough, light brown nonviable tissue    Wound Assessment and Plan  Wound Therapy - Assess/Plan/Recommendations Wound Therapy - Clinical Statement: Pt familiar to PT from previous admission.  Pt now with increased depth in necrotic area of wound.  Wound is able to be opened to reveal depth (like a cave).  Depth area has slough and adherent nonviable tissue. Wound Therapy - Functional Problem List: L1 paraplegia, impaired sensation Factors Delaying/Impairing Wound Healing: Altered sensation, Diabetes Mellitus, Immobility, Infection - systemic/local Hydrotherapy Plan: Debridement, Dressing change, Patient/family education, Pulsatile lavage with suction Wound Therapy - Frequency: 6X / week Wound Therapy - Current Recommendations:  (surgery and WOC have already seen pt and signed off) Wound Therapy - Follow Up Recommendations: dressing changes by RN, dressing changes by family/patient  Wound Therapy Goals- Improve the function of patient's integumentary system by progressing the wound(s) through the phases of wound healing (inflammation - proliferation - remodeling) by: Wound Therapy Goals - Improve the function of patient's integumentary system  by progressing the wound(s) through the phases of wound healing by: Decrease Necrotic Tissue to: 25 Decrease Necrotic Tissue - Progress: Goal set today Increase Granulation Tissue to: 75 Increase Granulation Tissue - Progress: Goal set today Improve Drainage Characteristics: Min Improve Drainage Characteristics - Progress: Goal set today Goals/treatment plan/discharge plan were made with and agreed upon  by patient/family: Yes Time For Goal Achievement: 2 weeks Wound Therapy - Potential for Goals: Good  Goals will be updated until maximal potential achieved or discharge criteria met.  Discharge criteria: when goals achieved, discharge from hospital, MD decision/surgical intervention, no progress towards goals, refusal/missing three consecutive treatments without notification or medical reason.  GP     Charges PT Wound Care Charges $Wound Debridement up to 20 cm: < or equal to 20 cm $PT PLS Gun and Tip: 1 Supply $PT Hydrotherapy Visit: 1 Visit      Kati PT, DPT Acute Rehabilitation Services Pager: [phone] Office: [place] 03/19/2021, 4:31 PM

## 2021-03-19 NOTE — Consult Note (Addendum)
WOC consult requested to follow patient along with physical therapy. Consult performed remotely after review of progress notes and photos in the EMR.  Pt is familiar to [facility] team from recent visit on 8/17. Surgical team assessed left buttock and has ordered the following plan of care: recommend WOC and PT hydrotherapy follow - would not recommend surgical debridement at this time - placed orders for BID saline wet to dry dressing for bedside nurses to perform - please make sure that kerlix is packed all the way into wound base and not just superficially - recommend follow up at wound care center as planned  - no other recommendation from a surgical standpoint, we will sign off. Please call if we can be of further assistance.  WOC team will plan to re-asses wound appearance with hydrotherapy on Mon, 9/26 as requested and determine further plan of care at that time. Thank-you,  Cammie Mcgee MSN, RN, CWOCN, [place], CNS [phone]

## 2021-03-19 NOTE — Progress Notes (Signed)
PHARMACY CONSULT NOTE FOR:  OUTPATIENT  PARENTERAL ANTIBIOTIC THERAPY (OPAT)  Indication: osteomyelitis Regimen: daptomycin 700mg  IV q24 hours and ertapenem 1000mg  IV q24 hours End date: 04/29/21  IV antibiotic discharge orders are pended. To discharging provider:  please sign these orders via discharge navigator,  Select New Orders & click on the button choice - Manage This Unsigned Work.     Thank you for allowing pharmacy to be a part of this patient's care.  , PharmD 03/19/2021 3:08 PM

## 2021-03-20 DIAGNOSIS — G8220 Paraplegia, unspecified: Secondary | ICD-10-CM

## 2021-03-20 DIAGNOSIS — M86652 Other chronic osteomyelitis, left thigh: Secondary | ICD-10-CM

## 2021-03-20 DIAGNOSIS — A498 Other bacterial infections of unspecified site: Secondary | ICD-10-CM

## 2021-03-20 DIAGNOSIS — A4902 Methicillin resistant Staphylococcus aureus infection, unspecified site: Secondary | ICD-10-CM

## 2021-03-20 DIAGNOSIS — W3400XA Accidental discharge from unspecified firearms or gun, initial encounter: Secondary | ICD-10-CM

## 2021-03-20 DIAGNOSIS — A429 Actinomycosis, unspecified: Secondary | ICD-10-CM

## 2021-03-20 LAB — CK: Total CK: 41 U/L — ABNORMAL LOW (ref 49–397)

## 2021-03-20 MED ORDER — SACCHAROMYCES BOULARDII 250 MG PO CAPS: 250.0000 mg | ORAL_CAPSULE | Freq: Two times a day (BID) | ORAL | 0 refills | Status: AC

## 2021-03-20 MED ORDER — COLLAGENASE 250 UNIT/GM EX OINT
TOPICAL_OINTMENT | Freq: Every day | CUTANEOUS | Status: DC
  Filled 2021-03-20: qty 30

## 2021-03-20 MED ORDER — SODIUM CHLORIDE 0.9 % IV SOLN
8.0000 mg/kg | Freq: Every day | INTRAVENOUS | Status: DC
  Administered 2021-03-20: 700 mg via INTRAVENOUS
  Filled 2021-03-20: qty 14

## 2021-03-20 MED ORDER — COLLAGENASE 250 UNIT/GM EX OINT: TOPICAL_OINTMENT | Freq: Every day | CUTANEOUS | 0 refills | Status: AC

## 2021-03-20 MED ORDER — DAPTOMYCIN IV (FOR PTA / DISCHARGE USE ONLY): 700.0000 mg | INTRAVENOUS | 0 refills | Status: AC

## 2021-03-20 MED ORDER — HEPARIN SOD (PORK) LOCK FLUSH 100 UNIT/ML IV SOLN
250.0000 [IU] | INTRAVENOUS | Status: AC | PRN
  Administered 2021-03-20: 250 [IU]

## 2021-03-20 MED ORDER — ERTAPENEM IV (FOR PTA / DISCHARGE USE ONLY): 1.0000 g | INTRAVENOUS | 0 refills | Status: AC

## 2021-03-20 NOTE — TOC Progression Note (Signed)
Transition of Care [facility]) - Progression Note    Patient Details  Name: John Figueroa MRN: [id number] Date of Birth: 10/30/1980  Transition of [facility] CM/SW Contact  Geni Bers, RN Phone Number: 03/20/2021, 10:04 AM  Clinical Narrative:     Pt is active with [facility] and will use [facility] at home. Pt has appointment with [facility] on 9/28 at 1:15 PM. Pt is encouraged to keep this appointment.   Expected Discharge Plan: Home w Home Health Services Barriers to Discharge: No Barriers Identified  Expected Discharge Plan and Services Expected Discharge Plan: Home w Home Health Services       Living arrangements for the past 2 months: Single Family Home Expected Discharge Date: 03/20/21                                     Social Determinants of Health (SDOH) Interventions    Readmission Risk Interventions No flowsheet data found.

## 2021-03-20 NOTE — Consult Note (Addendum)
WOC follow-up:  Discussed plan of care via secure chat after hydrotherapy performed by physical therapy yesterday for left buttock wound.  Surgical team had previously ordered wet to dry dressings.  I will add Santyl to assist with enzymatic debridement as requested.   Topical treatment orders provided for bedside nurses to perform as follows: Apply Santyl and moist gauze to L buttock wound Q day, using swab to fill.  PT will perform dressing changes Q Mon-Sat with hydrotherapy, BEDSIDE NURSE CHANGE DRESSING Q SUN. WOC team will assess wound appearance with physical therapy on Mon and determine further plan of care at that time.  Cammie Mcgee MSN, RN, CWOCN, [place], CNS [phone]

## 2021-03-20 NOTE — Progress Notes (Signed)
Physical Therapy Wound Treatment Patient Details  Name: John Figueroa MRN: [id number] Date of Birth: 1980-12-19  Today's Date: 03/20/2021 Time: 6384-6659 Time Calculation (min): 46 min  Subjective  Subjective Assessment Subjective: Pt agreeable to treatment. Date of Onset:  (present on admission, also had hydrotherapy previous admission) Prior Treatments: hydrotherapy with previous admission, left SNF AMA per notes, has been receiving HHRN for wound care  Pain Score:  No pain  Pt to d/c home today and requested pictures with his personal phone.  Pictures provided of wound, depth, and packing (for pt's mom to assist with dressing changes at home, RN also aware of pictures to assist with educating pt's mom when she arrives).  Pt anticipates d/c home today.  Explained wound and dressing change with pictures to pt, and he had no further questions.  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 PT Hydrotherapy - left ischial tuberosity/ buttocks (Active)  Wound Image    03/20/21 1300  Dressing Type ABD;Barrier Film (skin prep);Normal saline moist dressing;Moist to dry 03/20/21 1300  Dressing Changed 03/20/21 1300  Dressing Change Frequency Twice a day 03/20/21 1300  State of Healing Non-healing 03/20/21 1300  Site / Wound Assessment Red;Granulation tissue;Yellow 03/20/21 1300  % Wound base Red or Granulating 50% 03/20/21 1300  % Wound base Yellow/Fibrinous Exudate 50% 03/20/21 1300  Peri-wound Assessment Intact 03/20/21 1300  Wound Length (cm) 11 cm 03/19/21 1600  Wound Width (cm) 10 cm 03/19/21 1600  Wound Depth (cm) 8 cm 03/19/21 1600  Wound Surface Area (cm^2) 110 cm^2 03/19/21 1600  Wound Volume (cm^3) 880 cm^3 03/19/21 1600   Margins Epibole (rolled edges) 03/20/21 1300  Drainage Amount Moderate 03/20/21 1300  Drainage Description Serosanguineous 03/20/21 1300  Treatment Packing (Saline gauze);Off loading;Hydrotherapy (Pulse lavage);Debridement (Selective) 03/20/21 1300                                                Hydrotherapy Pulsed lavage therapy - wound location: left buttock, ischial tuberosity area Pulsed Lavage with Suction (psi): 10 psi Pulsed Lavage with Suction - Normal Saline Used: 1000 mL Pulsed Lavage Tip: Tip with splash shield Selective Debridement Selective Debridement - Location: left buttock, ischial tuberosity area - necrotic area and depth Selective Debridement - Tools Used: Forceps, Scissors Selective Debridement - Tissue Removed: yellow slough, light brown nonviable tissue    Wound Assessment and Plan  Wound Therapy - Assess/Plan/Recommendations Wound Therapy - Clinical Statement: Pt familiar to PT from previous admission.  Pt now with increased depth in necrotic  area of wound.  Wound is able to be opened to reveal depth (like a cave).  Depth area has slough and adherent nonviable tissue. Wound Therapy - Functional Problem List: L1 paraplegia, impaired sensation Factors Delaying/Impairing Wound Healing: Altered sensation, Diabetes Mellitus, Immobility, Infection - systemic/local Hydrotherapy Plan: Debridement, Dressing change, Patient/family education, Pulsatile lavage with suction Wound Therapy - Frequency: 6X / week Wound Therapy - Current Recommendations:  (surgery and WOC have already seen pt and signed off) Wound Therapy - Follow Up Recommendations: dressing changes by RN, dressing changes by family/patient  Wound Therapy Goals- Improve the function of patient's integumentary system by progressing the wound(s) through the phases of wound healing (inflammation - proliferation - remodeling) by: Wound Therapy Goals - Improve the function of patient's integumentary system  by progressing the wound(s) through the phases of wound healing by: Decrease Necrotic Tissue to: 25 Decrease Necrotic Tissue - Progress: Progressing toward goal Increase Granulation Tissue to: 75 Increase Granulation Tissue - Progress: Progressing toward goal Improve Drainage Characteristics: Min Improve Drainage Characteristics - Progress: Progressing toward goal Goals/treatment plan/discharge plan were made with and agreed upon by patient/family: Yes Time For Goal Achievement: 2 weeks Wound Therapy - Potential for Goals: Good  Goals will be updated until maximal potential achieved or discharge criteria met.  Discharge criteria: when goals achieved, discharge from hospital, MD decision/surgical intervention, no progress towards goals, refusal/missing three consecutive treatments without notification or medical reason.  GP     Charges PT Wound Care Charges $Wound Debridement up to 20 cm: < or equal to 20 cm $PT Hydrotherapy Dressing: 2 dressings $PT PLS Gun and Tip: 1 Supply $PT Hydrotherapy Visit: 1 Visit     Kati PT, DPT Acute Rehabilitation Services Pager: [phone] Office: [phone]   John Figueroa 03/20/2021, 1:57 PM
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
## 2021-03-20 NOTE — Progress Notes (Signed)
Patient discharged after PT performed hydrotherapy/wound care to L buttock pressure injury, Pam with line care came to educate pt & mom on PICC & IV abx, AVS reviewed in detail with patient/mom. Mom expressed that she already had wound care supplies at home & verbalized her understanding of wound care at home, follow-up appointments & meds.

## 2021-03-22 LAB — CULTURE, BLOOD (ROUTINE X 2)
Culture: NO GROWTH
Special Requests: ADEQUATE

## 2021-03-23 LAB — CULTURE, BLOOD (ROUTINE X 2)
Culture: NO GROWTH
Special Requests: ADEQUATE

## 2021-03-24 ENCOUNTER — Telehealth: Payer: Self-pay

## 2021-03-24 NOTE — Telephone Encounter (Signed)
Transition Care Management Follow-up Telephone Call Date of discharge and from where: 03/20/21 from [facility] How have you been since you were released from the hospital? "I been doing alright" Any questions or concerns? No  Items Reviewed: Did the pt receive and understand the discharge instructions provided? Yes  Medications obtained and verified? Yes  Other? No  Any new allergies since your discharge? No  Dietary orders reviewed? Yes Do you have support at home? Yes   Home Care and Equipment/Supplies: Were home health services ordered? yes If so, what is the name of the agency? [facility]  Has the agency set up a time to come to the patient's home? yes Were any new equipment or medical supplies ordered?  No What is the name of the medical supply agency? Already had supplies Were you able to get the supplies/equipment? yes Do you have any questions related to the use of the equipment or supplies? No  Functional Questionnaire: (I = Independent and D = Dependent) ADLs: assistance  Bathing/Dressing- Assistance  Meal Prep- D  Eating- I  Maintaining continence- D  Transferring/Ambulation- Assistance/D  Managing Meds- Assistance  Follow up appointments reviewed: PCP Hospital f/u appt confirmed? No  states he will call to schedule. Specialist Hospital f/u appt confirmed? Yes  Scheduled to see Dr. [facility] on 03/25/21 @ 1:15pm; outpatient rehab 04/14/21 at 10:15; Dr. Daiva Eves 04/15/21 @ 2:45. Are transportation arrangements needed? No  If their condition worsens, is the pt aware to call PCP or go to the Emergency Dept.? Yes Was the patient provided with contact information for the PCP's office or ED? Yes Was to pt encouraged to call back with questions or concerns? Yes    Kathyrn Sheriff, RN, MSN, BSN, CCM [facility] Care Management Coordinator

## 2021-03-25 ENCOUNTER — Encounter (HOSPITAL_BASED_OUTPATIENT_CLINIC_OR_DEPARTMENT_OTHER): Payer: Managed Care, Other (non HMO) | Attending: Physician Assistant | Admitting: Internal Medicine

## 2021-03-25 ENCOUNTER — Other Ambulatory Visit: Payer: Self-pay

## 2021-03-25 DIAGNOSIS — G8221 Paraplegia, complete: Secondary | ICD-10-CM | POA: Diagnosis not present

## 2021-03-25 DIAGNOSIS — M8638 Chronic multifocal osteomyelitis, other site: Secondary | ICD-10-CM | POA: Insufficient documentation

## 2021-03-25 DIAGNOSIS — L89324 Pressure ulcer of left buttock, stage 4: Secondary | ICD-10-CM | POA: Insufficient documentation

## 2021-03-25 DIAGNOSIS — Y839 Surgical procedure, unspecified as the cause of abnormal reaction of the patient, or of later complication, without mention of misadventure at the time of the procedure: Secondary | ICD-10-CM | POA: Diagnosis not present

## 2021-03-25 DIAGNOSIS — T8131XD Disruption of external operation (surgical) wound, not elsewhere classified, subsequent encounter: Secondary | ICD-10-CM | POA: Diagnosis not present

## 2021-03-25 DIAGNOSIS — L89329 Pressure ulcer of left buttock, unspecified stage: Secondary | ICD-10-CM | POA: Diagnosis present

## 2021-03-25 NOTE — Progress Notes (Signed)
[name], [name] ([id number]) Visit Report for 03/25/2021 Abuse/Suicide Risk Screen Details Patient Name: Date of Service: John Figueroa [facility] [place]. 03/25/2021 1:15 PM Medical Record Number: [id number] Patient Account Number: [id number] Date of Birth/Sex: Treating RN: 09/09/1980 (40 y.o. John Figueroa, John Figueroa Primary Care Kathalene Sporer: Oliver Barre Other Clinician: Referring Dezhane Staten: Treating Luken Shadowens/Extender: Carley Hammed in Treatment: 0 Abuse/Suicide Risk Screen Items Answer ABUSE RISK SCREEN: Has anyone close to you tried to hurt or harm you recentlyo No Do you feel uncomfortable with anyone in your familyo No Has anyone forced you do things that you didnt want to doo No Electronic Signature(s) Signed: 03/25/2021 5:21:18 PM By: Fonnie Mu RN Entered By: Fonnie Mu on 03/25/2021 13:57:52 -------------------------------------------------------------------------------- Activities of Daily Living Details Patient Name: Date of Service: John Figueroa [facility] [place]. 03/25/2021 1:15 PM Medical Record Number: [id number] Patient Account Number: [id number] Date of Birth/Sex: Treating RN: March 21, 1981 (40 y.o. John Figueroa, John Figueroa Primary Care Arrow Emmerich: Oliver Barre Other Clinician: Referring Arrietty Dercole: Treating Kayla Weekes/Extender: Carley Hammed in Treatment: 0 Activities of Daily Living Items Answer Activities of Daily Living (Please select one for each item) Drive Automobile Completely Able T Medications ake Completely Able Use T elephone Completely Able Care for Appearance Completely Able Use T oilet Completely Able Bath / Shower Need Assistance Dress Self Need Assistance Feed Self Completely Able Walk Not Able Get In / Out Bed Need Assistance Housework Need Assistance Prepare Meals Need Assistance Handle Money Need Assistance Shop for Self Need Assistance Electronic Signature(s) Signed: 03/25/2021 5:21:18 PM By: Fonnie Mu RN Entered  By: Fonnie Mu on 03/25/2021 14:19:57 -------------------------------------------------------------------------------- Education Screening Details Patient Name: Date of Service: John John Brick [facility] [place]. 03/25/2021 1:15 PM Medical Record Number: [id number] Patient Account Number: [id number] Date of Birth/Sex: Treating RN: 10-28-1980 (40 y.o. John Figueroa, John Figueroa Primary Care [name]: Oliver Barre Other Clinician: Referring Mattie Nordell: Treating Evi Mccomb/Extender: Carley Hammed in Treatment: 0 Primary Learner Assessed: Patient Learning Preferences/Education Level/Primary Language Learning Preference: Explanation, Demonstration, Video, Communication Board, Printed Material Highest Education Level: High School Preferred Language: English Cognitive Barrier Language Barrier: No Translator Needed: No Memory Deficit: No Emotional Barrier: No Cultural/Religious Beliefs Affecting Medical Care: No Physical Barrier Impaired Vision: No Impaired Hearing: No Decreased Hand dexterity: No Knowledge/Comprehension Knowledge Level: High Comprehension Level: High Ability to understand written instructions: High Ability to understand verbal instructions: High Motivation Anxiety Level: Calm Cooperation: Cooperative Education Importance: Denies Need Interest in Health Problems: Asks Questions Perception: Coherent Willingness to Engage in Self-Management High Activities: Readiness to Engage in Self-Management High Activities: Electronic Signature(s) Signed: 03/25/2021 5:21:18 PM By: Fonnie Mu RN Entered By: Fonnie Mu on 03/25/2021 13:36:33 -------------------------------------------------------------------------------- Fall Risk Assessment Details Patient Name: Date of Service: [address] [facility] [place]. 03/25/2021 1:15 PM Medical Record Number: [id number] Patient Account Number: [id number] Date of Birth/Sex: Treating RN: September 14, 1980 (40 y.o. John Figueroa,  John Figueroa Primary Care John Figueroa: Oliver Barre Other Clinician: Referring Megham Figueroa: Treating John Figueroa/Extender: Carley Hammed in Treatment: 0 Fall Risk Assessment Items Have you had 2 or more falls in the last 12 monthso 0 No Have you had any fall that resulted in injury in the last 12 monthso 0 No FALLS RISK SCREEN History of falling - immediate or within 3 months 0 No Secondary diagnosis (Do you have 2 or more medical diagnoseso) 0 No Ambulatory aid None/bed rest/wheelchair/nurse 0 No Crutches/cane/walker 0 No Furniture 0 No Intravenous therapy Access/Saline/Heparin Lock 0  No Gait/Transferring Normal/ bed rest/ wheelchair 0 No Weak (short steps with or without shuffle, stooped but able to lift head while walking, may seek 0 No support from furniture) Impaired (short steps with shuffle, may have difficulty arising from chair, head down, impaired 0 No balance) Mental Status Oriented to own ability 0 No Electronic Signature(s) Signed: 03/25/2021 5:21:18 PM By: Fonnie Mu RN Entered By: Fonnie Mu on 03/25/2021 13:35:07 -------------------------------------------------------------------------------- Foot Assessment Details Patient Name: Date of Service: John John Brick [facility] [place]. 03/25/2021 1:15 PM Medical Record Number: [id number] Patient Account Number: [id number] Date of Birth/Sex: Treating RN: 09-16-1980 (40 y.o. John Figueroa, John Figueroa Primary Care Deadra Diggins: Oliver Barre Other Clinician: Referring [name]: Treating [name]/Extender: Carley Hammed in Treatment: 0 Foot Assessment Items Site Locations + = Sensation present, - = Sensation absent, C = Callus, U = Ulcer R = Redness, W = Warmth, M = Maceration, PU = Pre-ulcerative lesion F = Fissure, S = Swelling, D = Dryness Assessment Right: Left: Other Deformity: No No Prior Foot Ulcer: No No Prior Amputation: No No Charcot Joint: No No Ambulatory Status: Gait: Notes N/A  pt. paraplegec sacral wounds Electronic Signature(s) Signed: 03/25/2021 5:21:18 PM By: Fonnie Mu RN Entered By: Fonnie Mu on 03/25/2021 13:35:36 -------------------------------------------------------------------------------- Nutrition Risk Screening Details Patient Name: Date of Service: John Alger [facility] [place] M. 03/25/2021 1:15 PM Medical Record Number: [id number] Patient Account Number: [id number] Date of Birth/Sex: Treating RN: 08-17-1980 (40 y.o. John Figueroa, John Figueroa Primary Care Rekita Miotke: Oliver Barre Other Clinician: Referring Loranda Mastel: Treating Khalon Cansler/Extender: Carley Hammed in Treatment: 0 Height (in): 78 Weight (lbs): 170 Body Mass Index (BMI): 19.6 Nutrition Risk Screening Items Score Screening NUTRITION RISK SCREEN: I have an illness or condition that made me change the kind and/or amount of food I eat 0 No I eat fewer than two meals per day 0 No I eat few fruits and vegetables, or milk products 0 No I have three or more drinks of beer, liquor or wine almost every day 0 No I have tooth or mouth problems that make it hard for me to eat 0 No I don't always have enough money to buy the food I need 0 No I eat alone most of the time 0 No I take three or more different prescribed or over-the-counter drugs a day 0 No Without wanting to, I have lost or gained 10 pounds in the last six months 0 No I am not always physically able to shop, cook and/or feed myself 0 No Nutrition Protocols Good Risk Protocol 0 No interventions needed Moderate Risk Protocol High Risk Proctocol Risk Level: Good Risk Score: 0 Electronic Signature(s) Signed: 03/25/2021 5:21:18 PM By: Fonnie Mu RN Entered By: Fonnie Mu on 03/25/2021 13:35:15

## 2021-03-26 NOTE — Progress Notes (Signed)
John Figueroa ([id number]) Visit Report for 03/25/2021 Allergy List Details Patient Name: Date of Service: John Figueroa [facility] [place]. 03/25/2021 1:15 PM Medical Record Number: [id number] Patient Account Number: [id number] Date of Birth/Sex: Treating RN: [birth year] (40 y.o. John Figueroa, John Figueroa Primary Care John Figueroa: John Figueroa Other Clinician: Referring John Figueroa: Treating John Figueroa/Extender: John Figueroa in Treatment: 0 Allergies Active Allergies No Known Allergies Allergy Notes Electronic Signature(s) Signed: 03/25/2021 5:21:18 PM By: Fonnie Mu RN Entered By: Fonnie Mu on 03/25/2021 13:29:42 -------------------------------------------------------------------------------- Arrival Information Details Patient Name: Date of Service: John Figueroa [place] M. 03/25/2021 1:15 PM Medical Record Number: [id number] Patient Account Number: [id number] Date of Birth/Sex: Treating RN: 1981-03-28 (40 y.o. John Figueroa, John Figueroa Primary Care John Figueroa: John Figueroa Other Clinician: Referring Davetta Olliff: Treating John Figueroa/Extender: John Figueroa in Treatment: 0 Visit Information Patient Arrived: Wheel Chair Arrival Time: 13:24 Accompanied By: self Transfer Assistance: Manual Patient Identification Verified: Yes Secondary Verification Process Completed: Yes Patient Requires Transmission-Based Precautions: No Patient Has Alerts: No History Since Last Visit Added or deleted any medications: No Any new allergies or adverse reactions: No Had John fall or experienced change in activities of daily living that may affect risk of falls: No Signs or symptoms of abuse/neglect since last visito No Hospitalized since last visit: No Implantable device outside of the clinic excluding cellular tissue based products placed in the center since last visit: No Electronic Signature(s) Signed: 03/25/2021 5:21:18 PM By: Fonnie Mu RN Entered By: Fonnie Mu  on 03/25/2021 13:27:36 -------------------------------------------------------------------------------- Clinic Level of Care Assessment Details Patient Name: Date of Service: John Figueroa [place]. 03/25/2021 1:15 PM Medical Record Number: [id number] Patient Account Number: [id number] Date of Birth/Sex: Treating RN: 1981-05-31 (40 y.o. John Figueroa, John Figueroa Primary Care John Figueroa: John Figueroa Other Clinician: Referring John Figueroa: Treating John Figueroa/Extender: John Figueroa in Treatment: 0 Clinic Level of Care Assessment Items TOOL 3 Quantity Score X- 1 0 Use when EandM and Procedure is performed on FOLLOW-UP visit ASSESSMENTS - Nursing Assessment / Reassessment X- 1 10 Reassessment of Co-morbidities (includes updates in patient status) X- 1 5 Reassessment of Adherence to Treatment Plan ASSESSMENTS - Wound and Skin Assessment / Reassessment []  - Points for Wound Assessment can only be taken for John new wound of unknown or different etiology and John procedure is 0 NOT performed to that wound X- 1 5 Simple Wound Assessment / Reassessment - one wound []  - 0 Complex Wound Assessment / Reassessment - multiple wounds []  - 0 Dermatologic / Skin Assessment (not related to wound area) ASSESSMENTS - Focused Assessment []  - 0 Circumferential Edema Measurements - multi extremities []  - 0 Nutritional Assessment / Counseling / Intervention []  - 0 Lower Extremity Assessment (monofilament, tuning fork, pulses) []  - 0 Peripheral Arterial Disease Assessment (using hand held doppler) ASSESSMENTS - Ostomy and/or Continence Assessment and Care X- 1 10 Incontinence Assessment and Management []  - 0 Ostomy Care Assessment and Management (repouching, etc.) PROCESS - Coordination of Care []  - Points for Discharge Coordination can only be taken for John new wound of unknown or different etiology and John procedure 0 is NOT performed to that wound []  - 0 Simple Patient / Family Education for  ongoing care X- 1 20 Complex (extensive) Patient / Family Education for ongoing care X- 1 10 Staff obtains , Records, T Results / Process Orders est X- 1 10 Staff telephones HHA, Nursing Homes / Clarify orders / etc []  - 0  Routine Transfer to another Facility (non-emergent condition) []  - 0 Routine Hospital Admission (non-emergent condition) X- 1 15 New Admissions / / Ordering NPWT Apligraf, etc. , []  - 0 Emergency Hospital Admission (emergent condition) []  - 0 Simple Discharge Coordination X- 1 15 Complex (extensive) Discharge Coordination PROCESS - Special Needs []  - 0 Pediatric / Minor Patient Management []  - 0 Isolation Patient Management []  - 0 Hearing / Language / Visual special needs X- 1 15 Assessment of Community assistance (transportation, D/C planning, etc.) []  - 0 Additional assistance / Altered mentation []  - 0 Support Surface(s) Assessment (bed, cushion, seat, etc.) INTERVENTIONS - Wound Cleansing / Measurement []  - Points for Wound Cleaning / Measurement, Wound Dressing, Specimen Collection and Specimen taken to lab can only 0 be taken for John new wound of unknown or different etiology and John procedure is NOT performed to that wound X- 1 5 Simple Wound Cleansing - one wound []  - 0 Complex Wound Cleansing - multiple wounds X- 1 5 Wound Imaging (photographs - any number of wounds) []  - 0 Wound Tracing (instead of photographs) X- 1 5 Simple Wound Measurement - one wound []  - 0 Complex Wound Measurement - multiple wounds INTERVENTIONS - Wound Dressings []  - 0 Small Wound Dressing one or multiple wounds X- 1 15 Medium Wound Dressing one or multiple wounds []  - 0 Large Wound Dressing one or multiple wounds INTERVENTIONS - Miscellaneous []  - 0 External ear exam []  - 0 Specimen Collection (cultures, biopsies, blood, body fluids, etc.) []  - 0 Specimen(s) / Culture(s) sent or taken to Lab for analysis []  - 0 Patient  Transfer (multiple staff / Manufacturing engineer / Similar devices) []  - 0 Simple Staple / Suture removal (25 or less) []  - 0 Complex Staple / Suture removal (26 or more) []  - 0 Hypo / Hyperglycemic Management (close monitor of Blood Glucose) []  - 0 Ankle / Brachial Index (ABI) - do not check if billed separately X- 1 5 Vital Signs Has the patient been seen at the hospital within the last three years: Yes Total Score: 150 Level Of Care: New/Established - Level 4 Electronic Signature(s) Signed: 03/25/2021 5:21:18 PM By: RN Entered By: on 03/25/2021 15:03:23 -------------------------------------------------------------------------------- Encounter Discharge Information Details Patient Name: Date of Service: John NTHO M. 03/25/2021 1:15 PM Medical Record Number: Patient Account Number: Date of Birth/Sex: Treating RN: Mar 14, 1981 (40 y.o. , John Figueroa Primary Care Elmina Hendel: Other Clinician: Referring Gelena Klosinski: Treating Charmel Pronovost/Extender: in Treatment: 0 Encounter Discharge Information Items Post Procedure Vitals Discharge Condition: Stable Temperature (F): 98.7 Ambulatory Status: Wheelchair Pulse (bpm): 70 Discharge Destination: Home Respiratory Rate (breaths/min): 17 Transportation: Private Auto Blood Pressure (mmHg): 117/73 Accompanied By: Schedule Follow-up Appointment: Yes Clinical Summary of Care: Patient Declined Electronic Signature(s) Signed: 03/25/2021 5:21:18 PM By: RN Entered By: Nurse, adult on 03/25/2021 15:06:11 -------------------------------------------------------------------------------- Lower Extremity Assessment Details Patient Name: Date of Service: John  M. 03/25/2021 1:15 PM Medical Record Number: 03/27/2021 Patient Account Number: Fonnie Mu Date of Birth/Sex: Treating RN: [birth year] (40 y.o. John Figueroa Primary Care Genise Strack: [place] Other Clinician: Referring Alfonzia Woolum: Treating Dianna Ewald/Extender: 03/27/2021 in Treatment: 0 Electronic Signature(s) Signed: 03/25/2021 5:21:18 PM By: [id number] RN Entered By: 12/15/1980 on 03/25/2021 13:35:42 -------------------------------------------------------------------------------- Multi Wound Chart Details Patient Name: Date of Service: John John Figueroa Figueroa, John NTHO John Figueroa M. 03/25/2021 1:15  PM Medical Record Number: [id number] Patient Account Number: [id number] Date of Birth/Sex: Treating RN: 03/21/1981 (40 y.o. [name] Primary Care Felissa Blouch: John Figueroa Other Clinician: Referring Kenyotta Dorfman: Treating Deziyah Arvin/Extender: John Figueroa in Treatment: 0 Vital Signs Height(in): 78 Pulse(bpm): 84 Weight(lbs): 170 Blood Pressure(mmHg): 146/89 Body Mass Index(BMI): 20 Temperature(F): 98.2 Respiratory Rate(breaths/min): 17 Photos: [N/John:N/John] Left Ischium N/John N/John Wound Location: Pressure Injury N/John N/John Wounding Event: Pressure Ulcer N/John N/John Primary Etiology: Tobacco Use, Paraplegia N/John N/John Comorbid History: 06/28/2020 N/John N/John Date Acquired: 0 N/John N/John Weeks of Treatment: Open N/John N/John Wound Status: 9x9.2x6.5 N/John N/John Measurements L x W x D (cm) 65.031 N/John N/John John (cm) : rea 422.701 N/John N/John Volume (cm) : 0.00% N/John N/John % Reduction in John rea: 0.00% N/John N/John % Reduction in Volume: 6 Position 1 (o'clock): 6.5 Maximum Distance 1 (cm): 5 Starting Position 1 (o'clock): 7 Ending Position 1 (o'clock): 5.5 Maximum Distance 1 (cm): Yes N/John N/John Tunneling: Yes N/John N/John Undermining: Category/Stage IV N/John N/John Classification: Large N/John N/John Exudate Amount: Serosanguineous N/John N/John Exudate Type: red, brown N/John N/John Exudate Color: Distinct, outline attached N/John N/John Wound Margin: Medium (34-66%) N/John N/John Granulation Amount: Red, Pink N/John N/John Granulation Quality: Medium (34-66%)  N/John N/John Necrotic Amount: Fat Layer (Subcutaneous Tissue): Yes N/John N/John Exposed Structures: Tendon: Yes Muscle: Yes Fascia: No Joint: No Bone: No None N/John N/John Epithelialization: Debridement - Excisional N/John N/John Debridement: Pre-procedure Verification/Time Out 14:13 N/John N/John Taken: Lidocaine N/John N/John Pain Control: Muscle, Subcutaneous, Slough N/John N/John Tissue Debrided: Skin/Subcutaneous Tissue/Muscle N/John N/John Level: 25 N/John N/John Debridement John (sq cm): rea Blade, Forceps N/John N/John Instrument: Minimum N/John N/John Bleeding: Pressure N/John N/John Hemostasis John chieved: 0 N/John N/John Procedural Pain: 0 N/John N/John Post Procedural Pain: Procedure was tolerated well N/John N/John Debridement Treatment Response: 9x9.2x6.5 N/John N/John Post Debridement Measurements L x W x D (cm) 422.701 N/John N/John Post Debridement Volume: (cm) Category/Stage IV N/John N/John Post Debridement Stage: Debridement N/John N/John Procedures Performed: Treatment Notes Electronic Signature(s) Signed: 03/25/2021 5:00:15 PM By: Baltazar Najjar MD Signed: 03/26/2021 4:49:13 PM By: Zandra Abts RN, BSN Entered By: Baltazar Najjar on 03/25/2021 14:31:51 -------------------------------------------------------------------------------- Multi-Disciplinary Care Plan Details Patient Name: Date of Service: John John Figueroa NTHO [place]. 03/25/2021 1:15 PM Medical Record Number: [id number] Patient Account Number: [id number] Date of Birth/Sex: Treating RN: 11-12-1980 (40 y.o. John Figueroa, John Figueroa Primary Care Jaevion Goto: John Figueroa Other Clinician: Referring Chidi Shirer: Treating Roiza Wiedel/Extender: John Figueroa in Treatment: 0 Active Inactive Orientation to the Wound Care Program Nursing Diagnoses: Knowledge deficit related to the wound healing center program Goals: Patient/caregiver will verbalize understanding of the Wound Healing Center Program Date Initiated: 03/25/2021 Target Resolution Date: 04/03/2021 Goal Status:  Active Interventions: Provide education on orientation to the wound center Notes: Wound/Skin Impairment Nursing Diagnoses: Impaired tissue integrity Knowledge deficit related to ulceration/compromised skin integrity Goals: Patient will have John decrease in wound volume by X% from date: (specify in notes) Date Initiated: 03/25/2021 Target Resolution Date: 04/04/2021 Goal Status: Active Patient/caregiver will verbalize understanding of skin care regimen Date Initiated: 03/25/2021 Target Resolution Date: 03/31/2021 Goal Status: Active Ulcer/skin breakdown will have John volume reduction of 30% by week 4 Date Initiated: 03/25/2021 Target Resolution Date: 04/03/2021 Goal Status: Active Ulcer/skin breakdown will have John volume reduction of 50% by week 8 Date Initiated: 03/25/2021 Target Resolution Date: 04/04/2021 Goal Status: Active Interventions: Assess patient/caregiver ability to obtain necessary supplies Notes: Electronic Signature(s) Signed: 03/25/2021 5:21:18 PM  By: Fonnie Mu RN Entered By: Fonnie Mu on 03/25/2021 14:25:46 -------------------------------------------------------------------------------- Pain Assessment Details Patient Name: Date of Service: John John Figueroa [facility] [place] M. 03/25/2021 1:15 PM Medical Record Number: [id number] Patient Account Number: [id number] Date of Birth/Sex: Treating RN: July 18, 1980 (40 y.o. John Figueroa, John Figueroa Primary Care Arkie Tagliaferro: John Figueroa Other Clinician: Referring Valory Wetherby: Treating Joandy Burget/Extender: John Figueroa in Treatment: 0 Active Problems Location of Pain Severity and Description of Pain Patient Has Paino No Site Locations Pain Management and Medication Current Pain Management: Electronic Signature(s) Signed: 03/25/2021 5:21:18 PM By: Fonnie Mu RN Entered By: Fonnie Mu on 03/25/2021  13:35:57 -------------------------------------------------------------------------------- Patient/Caregiver Education Details Patient Name: Date of Service: John Darliss Ridgel. 9/28/2022andnbsp1:15 PM Medical Record Number: [id number] Patient Account Number: [id number] Date of Birth/Gender: Treating RN: February 24, 1981 (40 y.o. Lucious Groves Primary Care Physician: John Figueroa Other Clinician: Referring Physician: Treating Physician/Extender: John Figueroa in Treatment: 0 Education Assessment Education Provided To: Patient Education Topics Provided Welcome T The [facility]: o Methods: Explain/Verbal Responses: Reinforcements needed, State content correctly Electronic Signature(s) Signed: 03/25/2021 5:21:18 PM By: Fonnie Mu RN Entered By: Fonnie Mu on 03/25/2021 14:25:58 -------------------------------------------------------------------------------- Wound Assessment Details Patient Name: Date of Service: John Figueroa [place] M. 03/25/2021 1:15 PM Medical Record Number: [id number] Patient Account Number: [id number] Date of Birth/Sex: Treating RN: 19-Jan-1981 (40 y.o. John Figueroa, John Figueroa Primary Care Kamorie Aldous: John Figueroa Other Clinician: Referring Montavious Wierzba: Treating Tamim Skog/Extender: John Figueroa in Treatment: 0 Wound Status Wound Number: 7 Primary Etiology: Pressure Ulcer Wound Location: Left Ischium Wound Status: Open Wounding Event: Pressure Injury Comorbid History: Tobacco Use, Paraplegia Date Acquired: 06/28/2020 Weeks Of Treatment: 0 Clustered Wound: No Photos Wound Measurements Length: (cm) 9 Width: (cm) 9.2 Depth: (cm) 6.5 Area: (cm) 65.031 Volume: (cm) 422.701 % Reduction in Area: 0% % Reduction in Volume: 0% Epithelialization: None Tunneling: Yes Position (o'clock): 6 Maximum Distance: (cm) 6.5 Undermining: Yes Starting Position (o'clock): 5 Ending Position (o'clock): 7 Maximum  Distance: (cm) 5.5 Wound Description Classification: Category/Stage IV Wound Margin: Distinct, outline attached Exudate Amount: Large Exudate Type: Serosanguineous Exudate Color: red, brown Foul Odor After Cleansing: No Slough/Fibrino Yes Wound Bed Granulation Amount: Medium (34-66%) Exposed Structure Granulation Quality: Red, Pink Fascia Exposed: No Necrotic Amount: Medium (34-66%) Fat Layer (Subcutaneous Tissue) Exposed: Yes Necrotic Quality: Adherent Slough Tendon Exposed: Yes Muscle Exposed: Yes Necrosis of Muscle: No Joint Exposed: No Bone Exposed: No Treatment Notes Wound #7 (Ischium) Wound Laterality: Left Cleanser Normal Saline Discharge Instruction: Cleanse the wound with Normal Saline prior to applying John clean dressing using gauze sponges, not tissue or cotton balls. Soap and Water Discharge Instruction: May shower and wash wound with dial antibacterial soap and water prior to dressing change. Peri-Wound Care Topical Skintegrity Hydrogel 4 (oz) Discharge Instruction: Apply hydrogel as directed Primary Dressing Secondary Dressing Woven Gauze Sponge, Non-Sterile 4x4 in Discharge Instruction: Apply over primary dressing as directed. ABD Pad, 5x9 Discharge Instruction: Apply over primary dressing as directed. Secured With [employer], 4.5x3.1 (in/yd) Discharge Instruction: Secure with Kerlix as directed. 31M Medipore H Soft Cloth Surgical T 4 x 2 (in/yd) ape Discharge Instruction: Secure dressing with tape as directed. Compression Wrap Compression Stockings Add-Ons Electronic Signature(s) Signed: 03/25/2021 4:47:29 PM By: Karl Ito Signed: 03/25/2021 5:21:18 PM By: Fonnie Mu RN Entered By: Karl Ito on 03/25/2021 13:55:50 -------------------------------------------------------------------------------- Vitals Details Patient Name: Date of Service: John Figueroa, John NTHO [place] M. 03/25/2021 1:15 PM  Medical Record Number: [id number] Patient  Account Number: [id number] Date of Birth/Sex: Treating RN: 07-09-1980 (40 y.o. John Figueroa, John Figueroa Primary Care Anala Whisenant: John Figueroa Other Clinician: Referring Cenia Zaragosa: Treating Jaz Mallick/Extender: John Figueroa in Treatment: 0 Vital Signs Time Taken: 13:28 Temperature (F): 98.2 Height (in): 78 Pulse (bpm): 84 Source: Stated Respiratory Rate (breaths/min): 17 Weight (lbs): 170 Blood Pressure (mmHg): 146/89 Source: Stated Reference Range: 80 - 120 mg / dl Body Mass Index (BMI): 19.6 Electronic Signature(s) Signed: 03/25/2021 5:21:18 PM By: Fonnie Mu RN Entered By: Fonnie Mu on 03/25/2021 13:28:29

## 2021-03-26 NOTE — Progress Notes (Signed)
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 multifocal osteomyelitis, other site Facility Procedures CPT4 Code: [id number] Description: 99214 - WOUND CARE VISIT-LEV 4 EST PT Modifier: Quantity: 1 CPT4 Code: 61950932 Description: 11043 - DEB MUSC/FASCIA 20 SQ CM/< ICD-10 Diagnosis Description 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, stage 4 Modifier: Quantity: 1 CPT4 Code: [id number] Description: 11046 - DEB MUSC/FASCIA EA ADDL 20 CM ICD-10 Diagnosis Description 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, stage 4 Modifier: Quantity: 1 Physician Procedures : CPT4 Code Description Modifier [id number] 99204 - WC PHYS LEVEL 4 - NEW PT 25 ICD-10 Diagnosis Description 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, stage 4  T81.31XD Disruption of external operation (surgical) wound, not elsewhere classified, subsequent  encounter M72.6 Necrotizing fasciitis G82.21 Paraplegia, complete Quantity: 1 : [id number] 11043 - WC PHYS DEBR MUSCLE/FASCIA 20 SQ CM ICD-10 Diagnosis Description  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

## 2021-04-01 ENCOUNTER — Other Ambulatory Visit: Payer: Self-pay

## 2021-04-01 ENCOUNTER — Encounter (HOSPITAL_BASED_OUTPATIENT_CLINIC_OR_DEPARTMENT_OTHER): Payer: Managed Care, Other (non HMO) | Attending: Internal Medicine | Admitting: Internal Medicine

## 2021-04-01 DIAGNOSIS — M726 Necrotizing fasciitis: Secondary | ICD-10-CM | POA: Diagnosis not present

## 2021-04-01 DIAGNOSIS — Y839 Surgical procedure, unspecified as the cause of abnormal reaction of the patient, or of later complication, without mention of misadventure at the time of the procedure: Secondary | ICD-10-CM | POA: Diagnosis not present

## 2021-04-01 DIAGNOSIS — M8638 Chronic multifocal osteomyelitis, other site: Secondary | ICD-10-CM | POA: Diagnosis not present

## 2021-04-01 DIAGNOSIS — G8221 Paraplegia, complete: Secondary | ICD-10-CM | POA: Diagnosis not present

## 2021-04-01 DIAGNOSIS — T8131XA Disruption of external operation (surgical) wound, not elsewhere classified, initial encounter: Secondary | ICD-10-CM | POA: Insufficient documentation

## 2021-04-01 DIAGNOSIS — L89324 Pressure ulcer of left buttock, stage 4: Secondary | ICD-10-CM | POA: Insufficient documentation
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
## 2021-04-02 NOTE — Progress Notes (Signed)
John, Figueroa ([id number]) Visit Report for 04/01/2021 Arrival Information Details Patient Name: Date of Service: [name] [facility] [place]. 04/01/2021 1:45 PM Medical Record Number: [id number] Patient Account Number: [id number] Date of Birth/Sex: Treating RN: 08/18/1980 (40 y.o. John Figueroa Primary Care Estephania Licciardi: Oliver Barre Other Clinician: Referring Graysyn Bache: Treating Jamillah Camilo/Extender: Carley Hammed in Treatment: 1 Visit Information History Since Last Visit Added or deleted any medications: No Patient Arrived: Wheel Chair Any new allergies or adverse reactions: No Arrival Time: 13:50 Had a fall or experienced change in No Accompanied By: self activities of daily living that may affect Transfer Assistance: Manual risk of falls: Patient Identification Verified: Yes Signs or symptoms of abuse/neglect since last visito No Secondary Verification Process Completed: Yes Hospitalized since last visit: No Patient Requires Transmission-Based Precautions: No Implantable device outside of the clinic excluding No Patient Has Alerts: No cellular tissue based products placed in the center since last visit: Has Dressing in Place as Prescribed: Yes Pain Present Now: No Electronic Signature(s) Signed: 04/01/2021 2:29:31 PM By: Karl Ito Entered By: Karl Ito on 04/01/2021 13:51:24 -------------------------------------------------------------------------------- Clinic Level of Care Assessment Details Patient Name: Date of Service: [name] [place]. 04/01/2021 1:45 PM Medical Record Number: [id number] Patient Account Number: [id number] Date of Birth/Sex: Treating RN: 1980-08-10 (40 y.o. John Figueroa Primary Care Doree Kuehne: Oliver Barre Other Clinician: Referring Jaxten Brosh: Treating Aixa Corsello/Extender: Carley Hammed in Treatment: 1 Clinic Level of Care Assessment Items TOOL 4 Quantity Score X- 1 0 Use when only an EandM is  performed on FOLLOW-UP visit ASSESSMENTS - Nursing Assessment / Reassessment X- 1 10 Reassessment of Co-morbidities (includes updates in patient status) X- 1 5 Reassessment of Adherence to Treatment Plan ASSESSMENTS - Wound and Skin A ssessment / Reassessment X - Simple Wound Assessment / Reassessment - one wound 1 5 []  - 0 Complex Wound Assessment / Reassessment - multiple wounds []  - 0 Dermatologic / Skin Assessment (not related to wound area) ASSESSMENTS - Focused Assessment []  - 0 Circumferential Edema Measurements - multi extremities []  - 0 Nutritional Assessment / Counseling / Intervention []  - 0 Lower Extremity Assessment (monofilament, tuning fork, pulses) []  - 0 Peripheral Arterial Disease Assessment (using hand held doppler) ASSESSMENTS - Ostomy and/or Continence Assessment and Care []  - 0 Incontinence Assessment and Management []  - 0 Ostomy Care Assessment and Management (repouching, etc.) PROCESS - Coordination of Care X - Simple Patient / Family Education for ongoing care 1 15 []  - 0 Complex (extensive) Patient / Family Education for ongoing care X- 1 10 Staff obtains , Records, T Results / Process Orders est X- 1 10 Staff telephones HHA, Nursing Homes / Clarify orders / etc []  - 0 Routine Transfer to another Facility (non-emergent condition) []  - 0 Routine Hospital Admission (non-emergent condition) []  - 0 New Admissions / / Ordering NPWT Apligraf, etc. , []  - 0 Emergency Hospital Admission (emergent condition) X- 1 10 Simple Discharge Coordination []  - 0 Complex (extensive) Discharge Coordination PROCESS - Special Needs []  - 0 Pediatric / Minor Patient Management []  - 0 Isolation Patient Management []  - 0 Hearing / Language / Visual special needs []  - 0 Assessment of Community assistance (transportation, D/C planning, etc.) []  - 0 Additional assistance / Altered mentation []  - 0 Support Surface(s) Assessment  (bed, cushion, seat, etc.) INTERVENTIONS - Wound Cleansing / Measurement X - Simple Wound Cleansing - one wound 1 5 []  - 0 Complex  Wound Cleansing - multiple wounds X- 1 5 Wound Imaging (photographs - any number of wounds) []  - 0 Wound Tracing (instead of photographs) X- 1 5 Simple Wound Measurement - one wound []  - 0 Complex Wound Measurement - multiple wounds INTERVENTIONS - Wound Dressings []  - 0 Small Wound Dressing one or multiple wounds X- 1 15 Medium Wound Dressing one or multiple wounds []  - 0 Large Wound Dressing one or multiple wounds []  - 0 Application of Medications - topical []  - 0 Application of Medications - injection INTERVENTIONS - Miscellaneous []  - 0 External ear exam []  - 0 Specimen Collection (cultures, biopsies, blood, body fluids, etc.) []  - 0 Specimen(s) / Culture(s) sent or taken to Lab for analysis []  - 0 Patient Transfer (multiple staff / / Similar devices) []  - 0 Simple Staple / Suture removal (25 or less) []  - 0 Complex Staple / Suture removal (26 or more) []  - 0 Hypo / Hyperglycemic Management (close monitor of Blood Glucose) []  - 0 Ankle / Brachial Index (ABI) - do not check if billed separately X- 1 5 Vital Signs Has the patient been seen at the hospital within the last three years: Yes Total Score: 100 Level Of Care: New/Established - Level 3 Electronic Signature(s) Signed: 04/02/2021 6:21:03 PM By: RN, BSN Entered By: on 04/01/2021 17:01:47 -------------------------------------------------------------------------------- Encounter Discharge Information Details Patient Name: Date of Service: MO NTHO M. 04/01/2021 1:45 PM Medical Record Number: Patient Account Number: Date of Birth/Sex: Treating RN: 1980/08/21 (40 y.o. Primary Care John Figueroa: Other Clinician: Referring Deloy Archey: Treating Damonica Chopra/Extender: in Treatment: 1 Encounter Discharge Information Items Discharge Condition: Stable Ambulatory Status: Wheelchair Discharge Destination: Home Transportation: Private Auto Accompanied By: alone Schedule Follow-up Appointment: Yes Clinical Summary of Care: Patient Declined Electronic Signature(s) Signed: 04/02/2021 6:21:03 PM By: Zandra Abts RN, BSN Entered By: Zandra Abts on 04/01/2021 17:02:22 -------------------------------------------------------------------------------- Multi Wound Chart Details Patient Name: Date of Service: [name] NTHO [place] M. 04/01/2021 1:45 PM Medical Record Number: [id number] Patient Account Number: [id number] Date of Birth/Sex: Treating RN: 17-Mar-1981 (40 y.o. John Figueroa Primary Care Jariyah Hackley: Oliver Barre Other Clinician: Referring Kimberlly Norgard: Treating Libbey Duce/Extender: Carley Hammed in Treatment: 1 Vital Signs Height(in): 78 Pulse(bpm): 84 Weight(lbs): 170 Blood Pressure(mmHg): 149/79 Body Mass Index(BMI): 20 Temperature(F): 98.5 Respiratory Rate(breaths/min): 17 Photos: [7:Left Ischium] [N/A:N/A N/A] Wound Location: [7:Pressure Injury] [N/A:N/A] Wounding Event: [7:Pressure Ulcer] [N/A:N/A] Primary Etiology: [7:Tobacco Use, Paraplegia] [N/A:N/A] Comorbid History: [7:06/28/2020] [N/A:N/A] Date Acquired: [7:1] [N/A:N/A] Weeks of Treatment: [7:Open] [N/A:N/A] Wound Status: [7:8x10.5x8.5] [N/A:N/A] Measurements L x W x D (cm) [7:65.973] [N/A:N/A] A (cm) : rea [7:560.774] [N/A:N/A] Volume (cm) : [7:-1.40%] [N/A:N/A] % Reduction in A rea: [7:-32.70%] [N/A:N/A] % Reduction in Volume: [7:Category/Stage IV] [N/A:N/A] Classification: [7:Large] [N/A:N/A] Exudate A mount: [7:Serosanguineous] [N/A:N/A] Exudate Type: [7:red, brown] [N/A:N/A] Exudate Color: [7:Distinct, outline attached] [N/A:N/A] Wound Margin: [7:Large (67-100%)] [N/A:N/A] Granulation A mount: [7:Pink] [N/A:N/A] Granulation Quality: [7:Small  (1-33%)] [N/A:N/A] Necrotic A mount: [7:Fat Layer (Subcutaneous Tissue): Yes N/A] Exposed Structures: [7:Tendon: Yes Muscle: Yes Fascia: No Joint: No Bone: No None] [N/A:N/A] Treatment Notes Electronic Signature(s) Signed: 04/02/2021 5:45:05 PM By: Zandra Abts MD Signed: 04/02/2021 6:21:03 PM By: John Brick RN, BSN Entered By: [place] on 04/01/2021 15:10:31 -------------------------------------------------------------------------------- Multi-Disciplinary Care Plan Details Patient Name: Date of Service: MO [id number], A NTHO [id number] M. 04/01/2021 1:45 PM Medical Record Number: [id number]  Patient Account Number: [id number] Date of Birth/Sex: Treating RN: 19-Jan-1981 (40 y.o. John Figueroa Primary Care Rondo Spittler: Oliver Barre Other Clinician: Referring Weyman Bogdon: Treating Ressie Slevin/Extender: Carley Hammed in Treatment: 1 Active Inactive Wound/Skin Impairment Nursing Diagnoses: Impaired tissue integrity Knowledge deficit related to ulceration/compromised skin integrity Goals: Patient/caregiver will verbalize understanding of skin care regimen Date Initiated: 03/25/2021 Target Resolution Date: 04/24/2021 Goal Status: Active Ulcer/skin breakdown will have a volume reduction of 30% by week 4 Date Initiated: 03/25/2021 Target Resolution Date: 04/24/2021 Goal Status: Active Ulcer/skin breakdown will have a volume reduction of 50% by week 8 Date Initiated: 03/25/2021 Target Resolution Date: 04/24/2021 Goal Status: Active Interventions: Assess patient/caregiver ability to obtain necessary supplies Notes: Electronic Signature(s) Signed: 04/02/2021 6:21:03 PM By: Zandra Abts RN, BSN Entered By: Zandra Abts on 04/01/2021 17:01:15 -------------------------------------------------------------------------------- Pain Assessment Details Patient Name: Date of Service: [name] NTHO [place] M. 04/01/2021 1:45 PM Medical Record Number: [id number] Patient Account  Number: [id number] Date of Birth/Sex: Treating RN: 02/28/1981 (40 y.o. John Figueroa Primary Care Tymar Polyak: Oliver Barre Other Clinician: Referring Jarret Torre: Treating Bj Morlock/Extender: Carley Hammed in Treatment: 1 Active Problems Location of Pain Severity and Description of Pain Patient Has Paino No Site Locations Pain Management and Medication Current Pain Management: Electronic Signature(s) Signed: 04/01/2021 2:29:31 PM By: Karl Ito Signed: 04/02/2021 6:21:03 PM By: Zandra Abts RN, BSN Entered By: Karl Ito on 04/01/2021 13:51:56 -------------------------------------------------------------------------------- Patient/Caregiver Education Details Patient Name: Date of Service: MO Darliss Ridgel. 10/5/2022andnbsp1:45 PM Medical Record Number: [id number] Patient Account Number: [id number] Date of Birth/Gender: Treating RN: Nov 20, 1980 (40 y.o. John Figueroa Primary Care Physician: Oliver Barre Other Clinician: Referring Physician: Treating Physician/Extender: Carley Hammed in Treatment: 1 Education Assessment Education Provided To: Patient Education Topics Provided Wound/Skin Impairment: Methods: Explain/Verbal Responses: State content correctly Electronic Signature(s) Signed: 04/02/2021 6:21:03 PM By: Zandra Abts RN, BSN Entered By: Zandra Abts on 04/01/2021 17:01:25 -------------------------------------------------------------------------------- Wound Assessment Details Patient Name: Date of Service: [name] [place] M. 04/01/2021 1:45 PM Medical Record Number: [id number] Patient Account Number: [id number] Date of Birth/Sex: Treating RN: August 03, 1980 (40 y.o. John Figueroa Primary Care Cainan Trull: Oliver Barre Other Clinician: Referring Joesiah Lonon: Treating Kasarah Sitts/Extender: Carley Hammed in Treatment: 1 Wound Status Wound Number: 7 Primary Etiology: Pressure Ulcer Wound  Location: Left Ischium Wound Status: Open Wounding Event: Pressure Injury Comorbid History: Tobacco Use, Paraplegia Date Acquired: 06/28/2020 Weeks Of Treatment: 1 Clustered Wound: No Photos Wound Measurements Length: (cm) 8 Width: (cm) 10.5 Depth: (cm) 8.5 Area: (cm) 65.973 Volume: (cm) 560.774 % Reduction in Area: -1.4% % Reduction in Volume: -32.7% Epithelialization: None Tunneling: No Undermining: No Wound Description Classification: Category/Stage IV Wound Margin: Distinct, outline attached Exudate Amount: Large Exudate Type: Serosanguineous Exudate Color: red, brown Foul Odor After Cleansing: No Slough/Fibrino Yes Wound Bed Granulation Amount: Large (67-100%) Exposed Structure Granulation Quality: Pink Fascia Exposed: No Necrotic Amount: Small (1-33%) Fat Layer (Subcutaneous Tissue) Exposed: Yes Necrotic Quality: Adherent Slough Tendon Exposed: Yes Muscle Exposed: Yes Necrosis of Muscle: No Joint Exposed: No Bone Exposed: No Treatment Notes Wound #7 (Ischium) Wound Laterality: Left Cleanser Normal Saline Discharge Instruction: Cleanse the wound with Normal Saline prior to applying a clean dressing using gauze sponges, not tissue or cotton balls. Soap and Water Discharge Instruction: May shower and wash wound with dial antibacterial soap and water prior to dressing change. Peri-Wound Care Topical Primary Dressing Dakin's Solution 0.125%, 16 (oz) Discharge Instruction:  Moisten gauze/kerlix with Dakin's solution and lightly pack Secondary Dressing Woven Gauze Sponge, Non-Sterile 4x4 in Discharge Instruction: Apply over primary dressing as directed. ABD Pad, 5x9 Discharge Instruction: Apply over primary dressing as directed. Secured With 39M Medipore H Soft Cloth Surgical T 4 x 2 (in/yd) ape Discharge Instruction: Secure dressing with tape as directed. Compression Wrap Compression Stockings Add-Ons Electronic Signature(s) Signed: 04/02/2021 6:21:03 PM  By: Zandra Abts RN, BSN Entered By: Zandra Abts on 04/01/2021 14:15:05 -------------------------------------------------------------------------------- Vitals Details Patient Name: Date of Service: [name] NTHO [place] M. 04/01/2021 1:45 PM Medical Record Number: [id number] Patient Account Number: [id number] Date of Birth/Sex: Treating RN: [birth year] (40 y.o. John Figueroa Primary Care Asheton Viramontes: Oliver Barre Other Clinician: Referring Jovin Fester: Treating Zandyr Barnhill/Extender: Carley Hammed in Treatment: 1 Vital Signs Time Taken: 13:51 Temperature (F): 98.5 Height (in): 78 Pulse (bpm): 84 Weight (lbs): 170 Respiratory Rate (breaths/min): 17 Body Mass Index (BMI): 19.6 Blood Pressure (mmHg): 149/79 Reference Range: 80 - 120 mg / dl Electronic Signature(s) Signed: 04/01/2021 2:29:31 PM By: Karl Ito Entered By: Karl Ito on 04/01/2021 13:51:44

## 2021-04-06 LAB — AEROBIC/ANAEROBIC CULTURE W GRAM STAIN (SURGICAL/DEEP WOUND): Culture: NO GROWTH

## 2021-04-08 ENCOUNTER — Other Ambulatory Visit: Payer: Self-pay

## 2021-04-08 ENCOUNTER — Encounter (HOSPITAL_BASED_OUTPATIENT_CLINIC_OR_DEPARTMENT_OTHER): Payer: Managed Care, Other (non HMO) | Admitting: Internal Medicine

## 2021-04-08 DIAGNOSIS — L89324 Pressure ulcer of left buttock, stage 4: Secondary | ICD-10-CM | POA: Diagnosis not present
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
## 2021-04-09 ENCOUNTER — Ambulatory Visit: Payer: Managed Care, Other (non HMO) | Attending: Internal Medicine | Admitting: Physical Therapy

## 2021-04-09 DIAGNOSIS — R293 Abnormal posture: Secondary | ICD-10-CM | POA: Insufficient documentation

## 2021-04-09 DIAGNOSIS — G8221 Paraplegia, complete: Secondary | ICD-10-CM | POA: Insufficient documentation

## 2021-04-09 DIAGNOSIS — R208 Other disturbances of skin sensation: Secondary | ICD-10-CM | POA: Diagnosis present

## 2021-04-09 DIAGNOSIS — R29818 Other symptoms and signs involving the nervous system: Secondary | ICD-10-CM | POA: Insufficient documentation

## 2021-04-09 NOTE — Progress Notes (Signed)
John Figueroa, John Figueroa ([id number]) Visit Report for 04/08/2021 Arrival Information Details Patient Name: Date of Service: [name] [facility] [place]. 04/08/2021 1:00 PM Medical Record Number: [id number] Patient Account Number: [id number] Date of Birth/Sex: Treating RN: 1980-07-19 (40 y.o. John Figueroa Primary Care Zack Crager: John Figueroa Other Clinician: Referring Teosha Casso: Treating Matheo Rathbone/Extender: John Figueroa in Treatment: 2 Visit Information History Since Last Visit Added or deleted any medications: No Patient Arrived: Wheel Chair Any new allergies or adverse reactions: No Arrival Time: 13:52 Had a fall or experienced change in No Transfer Assistance: Manual activities of daily living that may affect Patient Identification Verified: Yes risk of falls: Secondary Verification Process Completed: Yes Signs or symptoms of abuse/neglect since last visito No Patient Requires Transmission-Based Precautions: No Hospitalized since last visit: No Patient Has Alerts: No Implantable device outside of the clinic excluding No cellular tissue based products placed in the center since last visit: Has Dressing in Place as Prescribed: Yes Pain Present Now: No Electronic Signature(s) Signed: 04/08/2021 4:54:33 PM By: Antonieta Iba Entered By: Antonieta Iba on 04/08/2021 13:55:25 -------------------------------------------------------------------------------- Clinic Level of Care Assessment Details Patient Name: Date of Service: [name] [facility] [place]. 04/08/2021 1:00 PM Medical Record Number: [id number] Patient Account Number: [id number] Date of Birth/Sex: Treating RN: 02-10-1981 (40 y.o. John Figueroa Primary Care Recardo Linn: John Figueroa Other Clinician: Referring Grey Rakestraw: Treating Donald Jacque/Extender: John Figueroa in Treatment: 2 Clinic Level of Care Assessment Items TOOL 4 Quantity Score X- 1 0 Use when only an EandM is performed on FOLLOW-UP  visit ASSESSMENTS - Nursing Assessment / Reassessment X- 1 10 Reassessment of Co-morbidities (includes updates in patient status) X- 1 5 Reassessment of Adherence to Treatment Plan ASSESSMENTS - Wound and Skin A ssessment / Reassessment X - Simple Wound Assessment / Reassessment - one wound 1 5 []  - 0 Complex Wound Assessment / Reassessment - multiple wounds []  - 0 Dermatologic / Skin Assessment (not related to wound area) ASSESSMENTS - Focused Assessment []  - 0 Circumferential Edema Measurements - multi extremities []  - 0 Nutritional Assessment / Counseling / Intervention []  - 0 Lower Extremity Assessment (monofilament, tuning fork, pulses) []  - 0 Peripheral Arterial Disease Assessment (using hand held doppler) ASSESSMENTS - Ostomy and/or Continence Assessment and Care []  - 0 Incontinence Assessment and Management []  - 0 Ostomy Care Assessment and Management (repouching, etc.) PROCESS - Coordination of Care []  - 0 Simple Patient / Family Education for ongoing care X- 1 20 Complex (extensive) Patient / Family Education for ongoing care []  - 0 Staff obtains , Records, T Results / Process Orders est X- 1 10 Staff telephones HHA, Nursing Homes / Clarify orders / etc []  - 0 Routine Transfer to another Facility (non-emergent condition) []  - 0 Routine Hospital Admission (non-emergent condition) []  - 0 New Admissions / / Ordering NPWT Apligraf, etc. , []  - 0 Emergency Hospital Admission (emergent condition) []  - 0 Simple Discharge Coordination []  - 0 Complex (extensive) Discharge Coordination PROCESS - Special Needs []  - 0 Pediatric / Minor Patient Management []  - 0 Isolation Patient Management []  - 0 Hearing / Language / Visual special needs []  - 0 Assessment of Community assistance (transportation, D/C planning, etc.) []  - 0 Additional assistance / Altered mentation []  - 0 Support Surface(s) Assessment (bed, cushion, seat,  etc.) INTERVENTIONS - Wound Cleansing / Measurement X - Simple Wound Cleansing - one wound 1 5 []  - 0 Complex Wound Cleansing - multiple  wounds X- 1 5 Wound Imaging (photographs - any number of wounds) []  - 0 Wound Tracing (instead of photographs) X- 1 5 Simple Wound Measurement - one wound []  - 0 Complex Wound Measurement - multiple wounds INTERVENTIONS - Wound Dressings []  - 0 Small Wound Dressing one or multiple wounds X- 1 15 Medium Wound Dressing one or multiple wounds []  - 0 Large Wound Dressing one or multiple wounds []  - 0 Application of Medications - topical []  - 0 Application of Medications - injection INTERVENTIONS - Miscellaneous []  - 0 External ear exam []  - 0 Specimen Collection (cultures, biopsies, blood, body fluids, etc.) []  - 0 Specimen(s) / Culture(s) sent or taken to Lab for analysis []  - 0 Patient Transfer (multiple staff / / Similar devices) []  - 0 Simple Staple / Suture removal (25 or less) []  - 0 Complex Staple / Suture removal (26 or more) []  - 0 Hypo / Hyperglycemic Management (close monitor of Blood Glucose) []  - 0 Ankle / Brachial Index (ABI) - do not check if billed separately X- 1 5 Vital Signs Has the patient been seen at the hospital within the last three years: Yes Total Score: 85 Level Of Care: New/Established - Level 3 Electronic Signature(s) Signed: 04/08/2021 4:54:33 PM By: Entered By: on 04/08/2021 14:41:20 -------------------------------------------------------------------------------- Encounter Discharge Information Details Patient Name: Date of Service: MO , A NTHO M. 04/08/2021 1:00 PM Medical Record Number: Patient Account Number: Date of Birth/Sex: Treating RN: 1980/08/18 (40 y.o. Primary Care Corby Vandenberghe: Other Clinician: Referring Adoni Greenough: Treating Chandon Lazcano/Extender: in Treatment:  2 Encounter Discharge Information Items Discharge Condition: Stable Ambulatory Status: Wheelchair Discharge Destination: Home Transportation: Other Schedule Follow-up Appointment: Yes Clinical Summary of Care: Provided on 04/08/2021 Form Type Recipient Paper Patient Patient Electronic Signature(s) Signed: 04/08/2021 4:54:33 PM By: Antonieta Iba Entered By: 06/08/2021 on 04/08/2021 14:42:13 -------------------------------------------------------------------------------- Lower Extremity Assessment Details Patient Name: Date of Service: [place] [facility] [id number] M. 04/08/2021 1:00 PM Medical Record Number: 12/15/1980 Patient Account Number: [id number] Date of Birth/Sex: Treating RN: 08/30/1980 (40 y.o. John Figueroa Primary Care Urian Martenson: 06/08/2021 Other Clinician: Referring Shelvy Heckert: Treating Lilburn Straw/Extender: 06/08/2021 in Treatment: 2 Electronic Signature(s) Signed: 04/08/2021 4:54:33 PM By: Antonieta Iba Entered By: 06/08/2021 on 04/08/2021 13:55:41 -------------------------------------------------------------------------------- Multi Wound Chart Details Patient Name: Date of Service: MO [facility] [place] M. 04/08/2021 1:00 PM Medical Record Number: [id number] Patient Account Number: [id number] Date of Birth/Sex: Treating RN: 09-16-1980 (40 y.o. John Figueroa Primary Care Zebulun Deman: John Figueroa Other Clinician: Referring Danetra Glock: Treating Milburn Freeney/Extender: John Figueroa in Treatment: 2 Vital Signs Height(in): 78 Pulse(bpm): 92 Weight(lbs): 170 Blood Pressure(mmHg): 148/78 Body Mass Index(BMI): 20 Temperature(F): 98.2 Respiratory Rate(breaths/min): 16 Photos: [N/A:N/A] Left Ischium N/A N/A Wound Location: Pressure Injury N/A N/A Wounding Event: Pressure Ulcer N/A N/A Primary Etiology: Tobacco Use, Paraplegia N/A N/A Comorbid History: 06/28/2020 N/A N/A Date Acquired: 2 N/A N/A Weeks of Treatment: Open  N/A N/A Wound Status: 7.7x9.5x8.2 N/A N/A Measurements L x W x D (cm) 57.452 N/A N/A A (cm) : rea 471.105 N/A N/A Volume (cm) : 11.70% N/A N/A % Reduction in A rea: -11.50% N/A N/A % Reduction in Volume: Category/Stage IV N/A N/A Classification: Large N/A N/A Exudate A mount: Serosanguineous N/A N/A Exudate Type: red, brown N/A N/A Exudate Color: Distinct, outline attached N/A N/A Wound Margin: Large (67-100%)  N/A N/A Granulation A mount: Pink N/A N/A Granulation Quality: Small (1-33%) N/A N/A Necrotic A mount: Fat Layer (Subcutaneous Tissue): Yes N/A N/A Exposed Structures: Tendon: Yes Muscle: Yes Fascia: No Joint: No Bone: No None N/A N/A Epithelialization: Treatment Notes Wound #7 (Ischium) Wound Laterality: Left Cleanser Normal Saline Discharge Instruction: Cleanse the wound with Normal Saline prior to applying a clean dressing using gauze sponges, not tissue or cotton balls. Soap and Water Discharge Instruction: May shower and wash wound with dial antibacterial soap and water prior to dressing change. Peri-Wound Care Topical Primary Dressing Dakin's Solution 0.125%, 16 (oz) Discharge Instruction: Moisten gauze/kerlix with Dakin's solution and lightly pack Secondary Dressing Woven Gauze Sponge, Non-Sterile 4x4 in Discharge Instruction: Apply over primary dressing as directed. ABD Pad, 5x9 Discharge Instruction: Apply over primary dressing as directed. Secured With 65M Medipore H Soft Cloth Surgical T 4 x 2 (in/yd) ape Discharge Instruction: Secure dressing with tape as directed. Compression Wrap Compression Stockings Add-Ons Electronic Signature(s) Signed: 04/08/2021 4:37:06 PM By: Baltazar Najjar MD Signed: 04/09/2021 4:58:13 PM By: Zandra Abts RN, BSN Entered By: Baltazar Najjar on 04/08/2021 14:56:16 -------------------------------------------------------------------------------- Multi-Disciplinary Care Plan Details Patient Name: Date of  Service: [name] [facility] [place] M. 04/08/2021 1:00 PM Medical Record Number: [id number] Patient Account Number: [id number] Date of Birth/Sex: Treating RN: [birth year] (40 y.o. John Figueroa Primary Care Eniola Cerullo: John Figueroa Other Clinician: Referring Raul Torrance: Treating Lamaria Hildebrandt/Extender: John Figueroa in Treatment: 2 Active Inactive Wound/Skin Impairment Nursing Diagnoses: Impaired tissue integrity Knowledge deficit related to ulceration/compromised skin integrity Goals: Patient/caregiver will verbalize understanding of skin care regimen Date Initiated: 03/25/2021 Target Resolution Date: 04/24/2021 Goal Status: Active Ulcer/skin breakdown will have a volume reduction of 30% by week 4 Date Initiated: 03/25/2021 Target Resolution Date: 04/24/2021 Goal Status: Active Ulcer/skin breakdown will have a volume reduction of 50% by week 8 Date Initiated: 03/25/2021 Target Resolution Date: 04/24/2021 Goal Status: Active Interventions: Assess patient/caregiver ability to obtain necessary supplies Notes: Electronic Signature(s) Signed: 04/08/2021 4:54:33 PM By: Antonieta Iba Entered By: Antonieta Iba on 04/08/2021 13:52:17 -------------------------------------------------------------------------------- Pain Assessment Details Patient Name: Date of Service: Alm Bustard [place] M. 04/08/2021 1:00 PM Medical Record Number: [id number] Patient Account Number: [id number] Date of Birth/Sex: Treating RN: 1980/10/12 (40 y.o. John Figueroa Primary Care Dalissa Lovin: Other Clinician: Oliver Figueroa Referring Ronnae Kaser: Treating Kamille Toomey/Extender: John Figueroa in Treatment: 2 Active Problems Location of Pain Severity and Description of Pain Patient Has Paino No Site Locations Pain Management and Medication Current Pain Management: Electronic Signature(s) Signed: 04/08/2021 4:54:33 PM By: Antonieta Iba Entered By: Antonieta Iba on 04/08/2021  13:55:34 -------------------------------------------------------------------------------- Patient/Caregiver Education Details Patient Name: Date of Service: MO Darliss Ridgel. 10/12/2022andnbsp1:00 PM Medical Record Number: [id number] Patient Account Number: [id number] Date of Birth/Gender: Treating RN: [birth year] (40 y.o. John Figueroa Primary Care Physician: John Figueroa Other Clinician: Referring Physician: Treating Physician/Extender: John Figueroa in Treatment: 2 Education Assessment Education Provided To: Patient Education Topics Provided Pressure: Methods: Explain/Verbal, Printed Responses: State content correctly Wound/Skin Impairment: Methods: Explain/Verbal, Printed Responses: State content correctly Electronic Signature(s) Signed: 04/08/2021 4:54:33 PM By: Antonieta Iba Entered By: Antonieta Iba on 04/08/2021 13:52:39 -------------------------------------------------------------------------------- Wound Assessment Details Patient Name: Date of Service: MO Alger Memos [place] M. 04/08/2021 1:00 PM Medical Record Number: [id number] Patient Account Number: [id number] Date of Birth/Sex: Treating RN: 12/20/1980 (40 y.o. John Figueroa Primary Care Railee Bonillas: John Figueroa Other Clinician: Referring Rosana Farnell: Treating Rendon Howell/Extender:  John Figueroa in Treatment: 2 Wound Status Wound Number: 7 Primary Etiology: Pressure Ulcer Wound Location: Left Ischium Wound Status: Open Wounding Event: Pressure Injury Comorbid History: Tobacco Use, Paraplegia Date Acquired: 06/28/2020 Weeks Of Treatment: 2 Clustered Wound: No Photos Wound Measurements Length: (cm) 7.7 Width: (cm) 9.5 Depth: (cm) 8.2 Area: (cm) 57.452 Volume: (cm) 471.105 % Reduction in Area: 11.7% % Reduction in Volume: -11.5% Epithelialization: None Tunneling: No Undermining: No Wound Description Classification: Category/Stage IV Wound Margin:  Distinct, outline attached Exudate Amount: Large Exudate Type: Serosanguineous Exudate Color: red, brown Foul Odor After Cleansing: No Slough/Fibrino Yes Wound Bed Granulation Amount: Large (67-100%) Exposed Structure Granulation Quality: Pink Fascia Exposed: No Necrotic Amount: Small (1-33%) Fat Layer (Subcutaneous Tissue) Exposed: Yes Necrotic Quality: Adherent Slough Tendon Exposed: Yes Muscle Exposed: Yes Necrosis of Muscle: No Joint Exposed: No Bone Exposed: No Treatment Notes Wound #7 (Ischium) Wound Laterality: Left Cleanser Normal Saline Discharge Instruction: Cleanse the wound with Normal Saline prior to applying a clean dressing using gauze sponges, not tissue or cotton balls. Soap and Water Discharge Instruction: May shower and wash wound with dial antibacterial soap and water prior to dressing change. Peri-Wound Care Topical Primary Dressing Dakin's Solution 0.125%, 16 (oz) Discharge Instruction: Moisten gauze/kerlix with Dakin's solution and lightly pack Secondary Dressing Woven Gauze Sponge, Non-Sterile 4x4 in Discharge Instruction: Apply over primary dressing as directed. ABD Pad, 5x9 Discharge Instruction: Apply over primary dressing as directed. Secured With 74M Medipore H Soft Cloth Surgical T 4 x 2 (in/yd) ape Discharge Instruction: Secure dressing with tape as directed. Compression Wrap Compression Stockings Add-Ons Electronic Signature(s) Signed: 04/08/2021 4:54:33 PM By: Antonieta Iba Entered By: Antonieta Iba on 04/08/2021 14:06:33 -------------------------------------------------------------------------------- Vitals Details Patient Name: Date of Service: MO Karsten Ro, A NTHO [place] M. 04/08/2021 1:00 PM Medical Record Number: [id number] Patient Account Number: [id number] Date of Birth/Sex: Treating RN: [birth year] (40 y.o. John Figueroa Primary Care Rielly Corlett: John Figueroa Other Clinician: Referring [place]: Treating Jochebed Bills/Extender: John Figueroa in Treatment: 2 Vital Signs Time Taken: 14:00 Temperature (F): 98.2 Height (in): 78 Pulse (bpm): 92 Weight (lbs): 170 Respiratory Rate (breaths/min): 16 Body Mass Index (BMI): 19.6 Blood Pressure (mmHg): 148/78 Reference Range: 80 - 120 mg / dl Electronic Signature(s) Signed: 04/08/2021 4:54:33 PM By: Antonieta Iba Entered By: Antonieta Iba on 04/08/2021 14:01:34
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
## 2021-04-14 ENCOUNTER — Ambulatory Visit: Payer: Managed Care, Other (non HMO) | Admitting: Physical Therapy

## 2021-04-15 ENCOUNTER — Encounter: Payer: Self-pay | Admitting: Infectious Disease

## 2021-04-15 ENCOUNTER — Ambulatory Visit (INDEPENDENT_AMBULATORY_CARE_PROVIDER_SITE_OTHER): Payer: Managed Care, Other (non HMO) | Admitting: Infectious Disease

## 2021-04-15 ENCOUNTER — Other Ambulatory Visit: Payer: Self-pay

## 2021-04-15 ENCOUNTER — Encounter (HOSPITAL_BASED_OUTPATIENT_CLINIC_OR_DEPARTMENT_OTHER): Payer: Managed Care, Other (non HMO) | Admitting: Internal Medicine

## 2021-04-15 ENCOUNTER — Telehealth: Payer: Self-pay

## 2021-04-15 VITALS — BP 130/74 | HR 112 | Temp 97.8°F

## 2021-04-15 DIAGNOSIS — A4902 Methicillin resistant Staphylococcus aureus infection, unspecified site: Secondary | ICD-10-CM

## 2021-04-15 DIAGNOSIS — Z7185 Encounter for immunization safety counseling: Secondary | ICD-10-CM

## 2021-04-15 DIAGNOSIS — M86652 Other chronic osteomyelitis, left thigh: Secondary | ICD-10-CM

## 2021-04-15 DIAGNOSIS — M726 Necrotizing fasciitis: Secondary | ICD-10-CM | POA: Diagnosis not present

## 2021-04-15 DIAGNOSIS — L89324 Pressure ulcer of left buttock, stage 4: Secondary | ICD-10-CM | POA: Diagnosis not present

## 2021-04-15 DIAGNOSIS — N319 Neuromuscular dysfunction of bladder, unspecified: Secondary | ICD-10-CM

## 2021-04-15 DIAGNOSIS — F331 Major depressive disorder, recurrent, moderate: Secondary | ICD-10-CM

## 2021-04-15 DIAGNOSIS — N493 Fournier gangrene: Secondary | ICD-10-CM | POA: Diagnosis not present

## 2021-04-15 DIAGNOSIS — A429 Actinomycosis, unspecified: Secondary | ICD-10-CM | POA: Insufficient documentation

## 2021-04-15 DIAGNOSIS — F1010 Alcohol abuse, uncomplicated: Secondary | ICD-10-CM

## 2021-04-15 DIAGNOSIS — G8220 Paraplegia, unspecified: Secondary | ICD-10-CM

## 2021-04-15 HISTORY — DX: Methicillin resistant Staphylococcus aureus infection, unspecified site: A49.02

## 2021-04-15 HISTORY — DX: Actinomycosis, unspecified: A42.9

## 2021-04-15 MED ORDER — AMOXICILLIN-POT CLAVULANATE 875-125 MG PO TABS: 1.0000 | ORAL_TABLET | Freq: Two times a day (BID) | ORAL | 5 refills | Status: DC

## 2021-04-15 NOTE — Telephone Encounter (Signed)
Relayed verbal orders to [place] at [facility] that okay to pull PICC after last dose per Dr. Daiva Eves.   Sandie Ano, RN

## 2021-04-15 NOTE — Telephone Encounter (Signed)
Thank you :)
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
## 2021-04-20 ENCOUNTER — Encounter: Payer: Self-pay | Admitting: Infectious Disease

## 2021-04-21 NOTE — Progress Notes (Signed)
John Figueroa Figueroa, John Figueroa Figueroa ([id number]) Visit Report for 04/15/2021 Arrival Information Details Patient Name: Date of Service: John Figueroa John Figueroa Figueroa [facility] [place]. 04/15/2021 10:30 John Figueroa M Medical Record Number: [id number] Patient Account Number: [id number] Date of Birth/Sex: Treating RN: 01/23/1981 (40 y.o. John Figueroa Figueroa, [name] Primary Care Sihaam Chrobak: Oliver Barre Other Clinician: Referring Osby Sweetin: Treating Maeley Matton/Extender: Carley Hammed in Treatment: 3 Visit Information History Since Last Visit Added or deleted any medications: No Patient Arrived: Wheel Chair Any new allergies or adverse reactions: No Arrival Time: 11:16 Had John Figueroa fall or experienced change in No Accompanied By: self activities of daily living that may affect Transfer Assistance: Manual risk of falls: Patient Identification Verified: Yes Signs or symptoms of abuse/neglect since last visito No Secondary Verification Process Completed: Yes Hospitalized since last visit: No Patient Requires Transmission-Based Precautions: No Implantable device outside of the clinic excluding No Patient Has Alerts: No cellular tissue based products placed in the center since last visit: Has Dressing in Place as Prescribed: Yes Pain Present Now: No Electronic Signature(s) Signed: 04/21/2021 5:23:14 PM By: Fonnie Mu RN Entered By: Fonnie Mu on 04/15/2021 11:16:19 -------------------------------------------------------------------------------- Clinic Level of Care Assessment Details Patient Name: Date of Service: John Figueroa John Figueroa Figueroa [facility] [place] M. 04/15/2021 10:30 John Figueroa M Medical Record Number: [id number] Patient Account Number: [id number] Date of Birth/Sex: Treating RN: 29-Sep-1980 (40 y.o. John Figueroa Figueroa, [name] Primary Care Radford Pease: Oliver Barre Other Clinician: Referring Rahm Minix: Treating Tagg Eustice/Extender: Carley Hammed in Treatment: 3 Clinic Level of Care Assessment Items TOOL 4 Quantity Score X- 1 0 Use when  only an EandM is performed on FOLLOW-UP visit ASSESSMENTS - Nursing Assessment / Reassessment X- 1 10 Reassessment of Co-morbidities (includes updates in patient status) X- 1 5 Reassessment of Adherence to Treatment Plan ASSESSMENTS - Wound and Skin John Figueroa ssessment / Reassessment X - Simple Wound Assessment / Reassessment - one wound 1 5 []  - 0 Complex Wound Assessment / Reassessment - multiple wounds []  - 0 Dermatologic / Skin Assessment (not related to wound area) ASSESSMENTS - Focused Assessment []  - 0 Circumferential Edema Measurements - multi extremities []  - 0 Nutritional Assessment / Counseling / Intervention []  - 0 Lower Extremity Assessment (monofilament, tuning fork, pulses) []  - 0 Peripheral Arterial Disease Assessment (using hand held doppler) ASSESSMENTS - Ostomy and/or Continence Assessment and Care []  - 0 Incontinence Assessment and Management []  - 0 Ostomy Care Assessment and Management (repouching, etc.) PROCESS - Coordination of Care []  - 0 Simple Patient / Family Education for ongoing care X- 1 20 Complex (extensive) Patient / Family Education for ongoing care X- 1 10 Staff obtains , Records, T Results / Process Orders est X- 1 10 Staff telephones HHA, Nursing Homes / Clarify orders / etc []  - 0 Routine Transfer to another Facility (non-emergent condition) []  - 0 Routine Hospital Admission (non-emergent condition) []  - 0 New Admissions / / Ordering NPWT Apligraf, etc. , []  - 0 Emergency Hospital Admission (emergent condition) []  - 0 Simple Discharge Coordination X- 1 15 Complex (extensive) Discharge Coordination PROCESS - Special Needs []  - 0 Pediatric / Minor Patient Management []  - 0 Isolation Patient Management []  - 0 Hearing / Language / Visual special needs []  - 0 Assessment of Community assistance (transportation, D/C planning, etc.) []  - 0 Additional assistance / Altered mentation []  - 0 Support  Surface(s) Assessment (bed, cushion, seat, etc.) INTERVENTIONS - Wound Cleansing / Measurement X - Simple Wound Cleansing - one wound 1 5 []  -  0 Complex Wound Cleansing - multiple wounds X- 1 5 Wound Imaging (photographs - any number of wounds) []  - 0 Wound Tracing (instead of photographs) X- 1 5 Simple Wound Measurement - one wound []  - 0 Complex Wound Measurement - multiple wounds INTERVENTIONS - Wound Dressings []  - 0 Small Wound Dressing one or multiple wounds X- 1 15 Medium Wound Dressing one or multiple wounds []  - 0 Large Wound Dressing one or multiple wounds X- 1 5 Application of Medications - topical []  - 0 Application of Medications - injection INTERVENTIONS - Miscellaneous []  - 0 External ear exam []  - 0 Specimen Collection (cultures, biopsies, blood, body fluids, etc.) []  - 0 Specimen(s) / Culture(s) sent or taken to Lab for analysis []  - 0 Patient Transfer (multiple staff / / Similar devices) []  - 0 Simple Staple / Suture removal (25 or less) []  - 0 Complex Staple / Suture removal (26 or more) []  - 0 Hypo / Hyperglycemic Management (close monitor of Blood Glucose) []  - 0 Ankle / Brachial Index (ABI) - do not check if billed separately X- 1 5 Vital Signs Has the patient been seen at the hospital within the last three years: Yes Total Score: 115 Level Of Care: New/Established - Level 3 Electronic Signature(s) Signed: 04/21/2021 5:23:14 PM By: RN Entered By: on 04/15/2021 11:32:28 -------------------------------------------------------------------------------- Encounter Discharge Information Details Patient Name: Date of Service: John Figueroa , John Figueroa Figueroa M. 04/15/2021 10:30 John Figueroa M Medical Record Number: Patient Account Number: Nurse, adult Date of Birth/Sex: Treating RN: 1981-06-03 (40 y.o. , [name] Primary Care Lajuane Leatham: Other Clinician: Referring Davion Meara: Treating  Mirl Hillery/Extender: 04/23/2021 in Treatment: 3 Encounter Discharge Information Items Discharge Condition: Stable Ambulatory Status: Wheelchair Discharge Destination: Home Transportation: Private Auto Accompanied By: SELF Schedule Follow-up Appointment: Yes Clinical Summary of Care: Patient Declined Electronic Signature(s) Signed: 04/21/2021 5:23:14 PM By: Fonnie Mu RN Entered By: 04/17/2021 on 04/15/2021 11:47:50 -------------------------------------------------------------------------------- Lower Extremity Assessment Details Patient Name: Date of Service: John Figueroa [place] [facility] [id number] M. 04/15/2021 10:30 John Figueroa M Medical Record Number: 12/15/1980 Patient Account Number: [id number] Date of Birth/Sex: Treating RN: Aug 16, 1980 (40 y.o. Carley Hammed Primary Care Loetta Connelley: 04/23/2021 Other Clinician: Referring Placido Hangartner: Treating Kabrea Seeney/Extender: Fonnie Mu in Treatment: 3 Electronic Signature(s) Signed: 04/21/2021 5:23:14 PM By: 04/17/2021 RN Entered By: John Figueroa Figueroa on 04/15/2021 11:19:29 -------------------------------------------------------------------------------- Multi Wound Chart Details Patient Name: Date of Service: John Figueroa [place] [facility] [id number] M. 04/15/2021 10:30 John Figueroa M Medical Record Number: 12/15/1980 Patient Account Number: [id number] Date of Birth/Sex: Treating RN: 10-09-1980 (40 y.o. Carley Hammed Primary Care Jaskaran Dauzat: 04/23/2021 Other Clinician: Referring Darrie Macmillan: Treating Waverly Chavarria/Extender: Fonnie Mu in Treatment: 3 Vital Signs Height(in): 78 Pulse(bpm): 99 Weight(lbs): 170 Blood Pressure(mmHg): 147/74 Body Mass Index(BMI): 20 Temperature(F): 98 Respiratory Rate(breaths/min): 17 Photos: [N/John Figueroa:N/John Figueroa] Left Ischium N/John Figueroa N/John Figueroa Wound Location: Pressure Injury N/John Figueroa N/John Figueroa Wounding Event: Pressure Ulcer N/John Figueroa N/John Figueroa Primary Etiology: Tobacco Use, Paraplegia N/John Figueroa N/John Figueroa Comorbid  History: 06/28/2020 N/John Figueroa N/John Figueroa Date Acquired: 3 N/John Figueroa N/John Figueroa Weeks of Treatment: Open N/John Figueroa N/John Figueroa Wound Status: 7.5x10x8 N/John Figueroa N/John Figueroa Measurements L x W x D (cm) 58.905 N/John Figueroa N/John Figueroa John Figueroa (cm) : rea 471.239 N/John Figueroa N/John Figueroa Volume (cm) : 9.40% N/John Figueroa N/John Figueroa % Reduction in John Figueroa rea: -11.50% N/John Figueroa N/John Figueroa % Reduction in Volume: 12 Starting Position 1 (o'clock): 12 Ending Position 1 (o'clock): 8.5 Maximum Distance 1 (cm): Yes N/John Figueroa N/John Figueroa Undermining: Category/Stage IV N/John Figueroa  N/John Figueroa Classification: Large N/John Figueroa N/John Figueroa Exudate John Figueroa mount: Serosanguineous N/John Figueroa N/John Figueroa Exudate Type: red, brown N/John Figueroa N/John Figueroa Exudate Color: Distinct, outline attached N/John Figueroa N/John Figueroa Wound Margin: Large (67-100%) N/John Figueroa N/John Figueroa Granulation John Figueroa mount: Pink N/John Figueroa N/John Figueroa Granulation Quality: Small (1-33%) N/John Figueroa N/John Figueroa Necrotic John Figueroa mount: Fat Layer (Subcutaneous Tissue): Yes N/John Figueroa N/John Figueroa Exposed Structures: Tendon: Yes Muscle: Yes Fascia: No Joint: No Bone: No None N/John Figueroa N/John Figueroa Epithelialization: Treatment Notes Electronic Signature(s) Signed: 04/15/2021 4:24:04 PM By: Baltazar Najjar MD Signed: 04/15/2021 5:56:18 PM By: Zandra Abts RN, BSN Entered By: Baltazar Najjar on 04/15/2021 11:37:54 -------------------------------------------------------------------------------- Multi-Disciplinary Care Plan Details Patient Name: Date of Service: John Figueroa Figueroa, John Figueroa Figueroa [place] M. 04/15/2021 10:30 John Figueroa M Medical Record Number: [id number] Patient Account Number: [id number] Date of Birth/Sex: Treating RN: 23-Aug-1980 (40 y.o. John Figueroa Figueroa, [name] Primary Care Annaston Upham: Oliver Barre Other Clinician: Referring Reily Treloar: Treating Ginette Bradway/Extender: Carley Hammed in Treatment: 3 Active Inactive Wound/Skin Impairment Nursing Diagnoses: Impaired tissue integrity Knowledge deficit related to ulceration/compromised skin integrity Goals: Patient/caregiver will verbalize understanding of skin care regimen Date Initiated: 03/25/2021 Target Resolution Date: 04/28/2021 Goal Status: Active Ulcer/skin  breakdown will have John Figueroa volume reduction of 30% by week 4 Date Initiated: 03/25/2021 Target Resolution Date: 04/29/2021 Goal Status: Active Ulcer/skin breakdown will have John Figueroa volume reduction of 50% by week 8 Date Initiated: 03/25/2021 Target Resolution Date: 05/01/2021 Goal Status: Active Interventions: Assess patient/caregiver ability to obtain necessary supplies Notes: Electronic Signature(s) Signed: 04/21/2021 5:23:14 PM By: Fonnie Mu RN Entered By: Fonnie Mu on 04/15/2021 11:31:37 -------------------------------------------------------------------------------- Pain Assessment Details Patient Name: Date of Service: John Figueroa John Figueroa Figueroa [facility] [place] M. 04/15/2021 10:30 John Figueroa M Medical Record Number: [id number] Patient Account Number: [id number] Date of Birth/Sex: Treating RN: [birth year] (40 y.o. John Figueroa Figueroa, [name] Primary Care Damontay Alred: Oliver Barre Other Clinician: Referring Shantay Sonn: Treating Soumya Colson/Extender: Carley Hammed in Treatment: 3 Active Problems Location of Pain Severity and Description of Pain Patient Has Paino No Site Locations Pain Management and Medication Current Pain Management: Electronic Signature(s) Signed: 04/21/2021 5:23:14 PM By: Fonnie Mu RN Entered By: Fonnie Mu on 04/15/2021 11:19:23 -------------------------------------------------------------------------------- Patient/Caregiver Education Details Patient Name: Date of Service: John Figueroa Figueroa. 10/19/2022andnbsp10:30 John Figueroa M Medical Record Number: [id number] Patient Account Number: [id number] Date of Birth/Gender: Treating RN: Jun 22, 1981 (40 y.o. John Figueroa Figueroa Primary Care Physician: Oliver Barre Other Clinician: Referring Physician: Treating Physician/Extender: Carley Hammed in Treatment: 3 Education Assessment Education Provided To: Patient Education Topics Provided Basic Hygiene: Electronic Signature(s) Signed: 04/21/2021  5:23:14 PM By: Fonnie Mu RN Entered By: Fonnie Mu on 04/15/2021 11:31:49 -------------------------------------------------------------------------------- Wound Assessment Details Patient Name: Date of Service: John Figueroa Figueroa [place] M. 04/15/2021 10:30 John Figueroa M Medical Record Number: [id number] Patient Account Number: [id number] Date of Birth/Sex: Treating RN: 20-Dec-1980 (40 y.o. John Figueroa Figueroa, [name] Primary Care Danyell Awbrey: Oliver Barre Other Clinician: Referring Zelina Jimerson: Treating Eurika Sandy/Extender: Carley Hammed in Treatment: 3 Wound Status Wound Number: 7 Primary Etiology: Pressure Ulcer Wound Location: Left Ischium Wound Status: Open Wounding Event: Pressure Injury Comorbid History: Tobacco Use, Paraplegia Date Acquired: 06/28/2020 Weeks Of Treatment: 3 Clustered Wound: No Photos Wound Measurements Length: (cm) 7.5 Width: (cm) 10 Depth: (cm) 8 Area: (cm) 58.905 Volume: (cm) 471.239 % Reduction in Area: 9.4% % Reduction in Volume: -11.5% Epithelialization: None Tunneling: No Undermining: Yes Starting Position (o'clock): 12 Ending Position (o'clock): 12 Maximum Distance: (cm) 8.5 Wound Description Classification: Category/Stage IV Wound Margin: Distinct, outline attached Exudate Amount: Large Exudate Type: Serosanguineous  Exudate Color: red, brown Foul Odor After Cleansing: No Slough/Fibrino Yes Wound Bed Granulation Amount: Large (67-100%) Exposed Structure Granulation Quality: Pink Fascia Exposed: No Necrotic Amount: Small (1-33%) Fat Layer (Subcutaneous Tissue) Exposed: Yes Necrotic Quality: Adherent Slough Tendon Exposed: Yes Muscle Exposed: Yes Necrosis of Muscle: No Joint Exposed: No Bone Exposed: No Treatment Notes Wound #7 (Ischium) Wound Laterality: Left Cleanser Normal Saline Discharge Instruction: Cleanse the wound with Normal Saline prior to applying John Figueroa clean dressing using gauze sponges, not tissue or cotton  balls. Soap and Water Discharge Instruction: May shower and wash wound with dial antibacterial soap and water prior to dressing change. Peri-Wound Care Topical Primary Dressing Dakin's Solution 0.125%, 16 (oz) Discharge Instruction: Moisten gauze/kerlix with Dakin's solution and lightly pack Secondary Dressing Woven Gauze Sponge, Non-Sterile 4x4 in Discharge Instruction: Apply over primary dressing as directed. ABD Pad, 5x9 Discharge Instruction: Apply over primary dressing as directed. Secured With 22M Medipore H Soft Cloth Surgical T 4 x 2 (in/yd) ape Discharge Instruction: Secure dressing with tape as directed. Compression Wrap Compression Stockings Add-Ons Electronic Signature(s) Signed: 04/21/2021 5:23:14 PM By: Fonnie Mu RN Entered By: Fonnie Mu on 04/15/2021 11:29:37 -------------------------------------------------------------------------------- Vitals Details Patient Name: Date of Service: John Figueroa Figueroa, John Figueroa Figueroa [place] M. 04/15/2021 10:30 John Figueroa M Medical Record Number: [id number] Patient Account Number: [id number] Date of Birth/Sex: Treating RN: 09/17/1980 (40 y.o. John Figueroa Figueroa, [name] Primary Care Blake Goya: Oliver Barre Other Clinician: Referring Gerturde Kuba: Treating Catherina Pates/Extender: Carley Hammed in Treatment: 3 Vital Signs Time Taken: 11:17 Temperature (F): 98 Height (in): 78 Pulse (bpm): 99 Weight (lbs): 170 Respiratory Rate (breaths/min): 17 Body Mass Index (BMI): 19.6 Blood Pressure (mmHg): 147/74 Reference Range: 80 - 120 mg / dl Electronic Signature(s) Signed: 04/21/2021 5:23:14 PM By: Fonnie Mu RN Entered By: Fonnie Mu on 04/15/2021 11:19:19
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
## 2021-04-22 ENCOUNTER — Other Ambulatory Visit: Payer: Self-pay

## 2021-04-22 ENCOUNTER — Encounter (HOSPITAL_BASED_OUTPATIENT_CLINIC_OR_DEPARTMENT_OTHER): Payer: Managed Care, Other (non HMO) | Admitting: Internal Medicine
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
## 2021-04-23 NOTE — Progress Notes (Signed)
DENIS, CARREON ([id number]) Visit Report for 04/22/2021 Arrival Information Details Patient Name: Date of Service: [name] [place]. 04/22/2021 1:00 PM Medical Record Number: [id number] Patient Account Number: [id number] Date of Birth/Sex: Treating RN: 1980/10/14 (40 y.o. [name] Primary Care Logann Whitebread: Oliver Barre Other Clinician: Referring Monee Dembeck: Treating Nayson Traweek/Extender: Carley Hammed in Treatment: 4 Visit Information History Since Last Visit Added or deleted any medications: No Patient Arrived: Wheel Chair Any new allergies or adverse reactions: No Arrival Time: 13:47 Had a fall or experienced change in No Accompanied By: Self activities of daily living that may affect Transfer Assistance: Manual risk of falls: Patient Identification Verified: Yes Signs or symptoms of abuse/neglect since last visito No Secondary Verification Process Completed: Yes Hospitalized since last visit: No Patient Requires Transmission-Based Precautions: No Implantable device outside of the clinic excluding No Patient Has Alerts: No cellular tissue based products placed in the center since last visit: Has Dressing in Place as Prescribed: Yes Pain Present Now: No Electronic Signature(s) Signed: 04/23/2021 5:45:17 PM By: Zandra Abts RN, BSN Entered By: Zandra Abts on 04/22/2021 13:47:26 -------------------------------------------------------------------------------- Multi Wound Chart Details Patient Name: Date of Service: [name] 04/22/2021 1:00 PM Medical Record Number: [id number] Patient Account Number: [id number] Date of Birth/Sex: Treating RN: [birth year] (40 y.o. [name] Primary Care Mikyle Sox: Oliver Barre Other Clinician: Referring Coal Nearhood: Treating Vernell Back/Extender: Carley Hammed in Treatment: 4 Vital Signs Height(in): 78 Pulse(bpm): 94 Weight(lbs): 170 Blood Pressure(mmHg): 165/91 Body Mass  Index(BMI): 20 Temperature(F): 98.2 Respiratory Rate(breaths/min): 17 Photos: [7:Left Ischium] [N/A:N/A N/A] Wound Location: [7:Pressure Injury] [N/A:N/A] Wounding Event: [7:Pressure Ulcer] [N/A:N/A] Primary Etiology: [7:Tobacco Use, Paraplegia] [N/A:N/A] Comorbid History: [7:06/28/2020] [N/A:N/A] Date Acquired: [7:4] [N/A:N/A] Weeks of Treatment: [7:Open] [N/A:N/A] Wound Status: [7:7.5x8.5x8.2] [N/A:N/A] Measurements L x W x D (cm) [7:50.069] [N/A:N/A] A (cm) : rea [7:410.567] [N/A:N/A] Volume (cm) : [7:23.00%] [N/A:N/A] % Reduction in A rea: [7:2.90%] [N/A:N/A] % Reduction in Volume: [7:12] Starting Position 1 (o'clock): [7:12] Ending Position 1 (o'clock): [7:8] Maximum Distance 1 (cm): [7:Yes] [N/A:N/A] Undermining: [7:Category/Stage IV] [N/A:N/A] Classification: [7:Large] [N/A:N/A] Exudate A mount: [7:Serosanguineous] [N/A:N/A] Exudate Type: [7:red, brown] [N/A:N/A] Exudate Color: [7:Distinct, outline attached] [N/A:N/A] Wound Margin: [7:Large (67-100%)] [N/A:N/A] Granulation A mount: [7:Pink] [N/A:N/A] Granulation Quality: [7:Small (1-33%)] [N/A:N/A] Necrotic A mount: [7:Fat Layer (Subcutaneous Tissue): Yes N/A] Exposed Structures: [7:Tendon: Yes Muscle: Yes Fascia: No Joint: No Bone: No None] [N/A:N/A] Treatment Notes Electronic Signature(s) Signed: 04/22/2021 4:58:10 PM By: Baltazar Najjar MD Signed: 04/23/2021 5:45:17 PM By: Zandra Abts RN, BSN Entered By: Baltazar Najjar on 04/22/2021 15:00:19 -------------------------------------------------------------------------------- Negative Pressure Wound Therapy Application (NPWT) Details Patient Name: Date of Service: Madalyn Rob [facility] [place] M. 04/22/2021 1:00 PM Medical Record Number: [id number] Patient Account Number: [id number] Date of Birth/Sex: Treating RN: 05-24-1981 (40 y.o. [name] Primary Care Isamu Trammel: Oliver Barre Other Clinician: Referring Socorro Ebron: Treating Aaro Meyers/Extender: Carley Hammed in Treatment: 4 NPWT Application Performed for: Wound #7 Left Ischium Performed By: Zandra Abts, RN Type: Other Coverage Size (sq cm): 63.75 Pressure Type: Constant Pressure Setting: 120 mmHG Drain Type: None Quantity of Sponges/Gauze Inserted: 2 pieces black foam inserted Sponge/Dressing Type: Foam, Black Date Initiated: 04/20/2021 Response to Treatment: pt tolerated well Post Procedure Diagnosis Same as Pre-procedure Electronic Signature(s) Signed: 04/23/2021 5:45:17 PM By: Zandra Abts RN, BSN Entered By: Zandra Abts on 04/22/2021 15:13:04 -------------------------------------------------------------------------------- Pain Assessment Details Patient Name: Date of Service: [name], A [facility]  [place] M. 04/22/2021 1:00 PM Medical Record Number: [id number] Patient Account Number: [id number] Date of Birth/Sex: Treating RN: July 22, 1980 (40 y.o. [name] Primary Care Osiel Stick: Oliver Barre Other Clinician: Referring Oakleigh Hesketh: Treating Masyn Fullam/Extender: Carley Hammed in Treatment: 4 Active Problems Location of Pain Severity and Description of Pain Patient Has Paino No Site Locations Pain Management and Medication Current Pain Management: Electronic Signature(s) Signed: 04/23/2021 5:45:17 PM By: Zandra Abts RN, BSN Entered By: Zandra Abts on 04/22/2021 13:48:06 -------------------------------------------------------------------------------- Wound Assessment Details Patient Name: Date of Service: [name] 04/22/2021 1:00 PM Medical Record Number: [id number] Patient Account Number: [id number] Date of Birth/Sex: Treating RN: 01/16/1981 (40 y.o. [name] Primary Care Myca Perno: Oliver Barre Other Clinician: Referring Ermina Oberman: Treating Mianna Iezzi/Extender: Carley Hammed in Treatment: 4 Wound Status Wound Number: 7 Primary Etiology: Pressure Ulcer Wound Location: Left  Ischium Wound Status: Open Wounding Event: Pressure Injury Comorbid History: Tobacco Use, Paraplegia Date Acquired: 06/28/2020 Weeks Of Treatment: 4 Clustered Wound: No Photos Wound Measurements Length: (cm) 7.5 Width: (cm) 8.5 Depth: (cm) 8.2 Area: (cm) 50.069 Volume: (cm) 410.567 % Reduction in Area: 23% % Reduction in Volume: 2.9% Epithelialization: None Tunneling: No Undermining: Yes Starting Position (o'clock): 12 Ending Position (o'clock): 12 Maximum Distance: (cm) 8 Wound Description Classification: Category/Stage IV Wound Margin: Distinct, outline attached Exudate Amount: Large Exudate Type: Serosanguineous Exudate Color: red, brown Foul Odor After Cleansing: No Slough/Fibrino Yes Wound Bed Granulation Amount: Large (67-100%) Exposed Structure Granulation Quality: Pink Fascia Exposed: No Necrotic Amount: Small (1-33%) Fat Layer (Subcutaneous Tissue) Exposed: Yes Necrotic Quality: Adherent Slough Tendon Exposed: Yes Muscle Exposed: Yes Necrosis of Muscle: No Joint Exposed: No Bone Exposed: No Electronic Signature(s) Signed: 04/23/2021 5:45:17 PM By: Zandra Abts RN, BSN Entered By: Zandra Abts on 04/22/2021 14:24:13 -------------------------------------------------------------------------------- Vitals Details Patient Name: Date of Service: [name] 04/22/2021 1:00 PM Medical Record Number: [id number] Patient Account Number: [id number] Date of Birth/Sex: Treating RN: March 21, 1981 (40 y.o. [name] Primary Care Lei Dower: Oliver Barre Other Clinician: Referring Evart Mcdonnell: Treating Castin Donaghue/Extender: Carley Hammed in Treatment: 4 Vital Signs Time Taken: 13:47 Temperature (F): 98.2 Height (in): 78 Pulse (bpm): 94 Weight (lbs): 170 Respiratory Rate (breaths/min): 17 Body Mass Index (BMI): 19.6 Blood Pressure (mmHg): 165/91 Reference Range: 80 - 120 mg / dl Electronic Signature(s) Signed: 04/23/2021  5:45:17 PM By: Zandra Abts RN, BSN Entered By: Zandra Abts on 04/22/2021 13:47:53

## 2021-04-27 ENCOUNTER — Encounter: Payer: Self-pay | Admitting: Infectious Disease

## 2021-04-29 ENCOUNTER — Encounter (HOSPITAL_BASED_OUTPATIENT_CLINIC_OR_DEPARTMENT_OTHER): Payer: Managed Care, Other (non HMO) | Attending: Internal Medicine | Admitting: Internal Medicine

## 2021-04-29 ENCOUNTER — Other Ambulatory Visit: Payer: Self-pay

## 2021-04-29 DIAGNOSIS — M8638 Chronic multifocal osteomyelitis, other site: Secondary | ICD-10-CM | POA: Insufficient documentation

## 2021-04-29 DIAGNOSIS — X58XXXD Exposure to other specified factors, subsequent encounter: Secondary | ICD-10-CM | POA: Diagnosis not present

## 2021-04-29 DIAGNOSIS — L89324 Pressure ulcer of left buttock, stage 4: Secondary | ICD-10-CM | POA: Diagnosis not present

## 2021-04-29 DIAGNOSIS — Z87440 Personal history of urinary (tract) infections: Secondary | ICD-10-CM | POA: Insufficient documentation

## 2021-04-29 DIAGNOSIS — M726 Necrotizing fasciitis: Secondary | ICD-10-CM | POA: Insufficient documentation

## 2021-04-29 DIAGNOSIS — T8131XD Disruption of external operation (surgical) wound, not elsewhere classified, subsequent encounter: Secondary | ICD-10-CM | POA: Diagnosis not present

## 2021-04-29 DIAGNOSIS — I10 Essential (primary) hypertension: Secondary | ICD-10-CM | POA: Diagnosis not present

## 2021-04-29 DIAGNOSIS — G8221 Paraplegia, complete: Secondary | ICD-10-CM | POA: Insufficient documentation
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
## 2021-04-30 NOTE — Progress Notes (Signed)
MALCOM, SANANTONIO ([id number]) Visit Report for 04/29/2021 Arrival Information Details Patient Name: Date of Service: [name] [facility] [place]. 04/29/2021 10:30 A M Medical Record Number: [id number] Patient Account Number: [id number] Date of Birth/Sex: Treating RN: 21-Apr-1981 (40 y.o. John Figueroa Primary Care Minh Jasper: Cathlean Cower Other Clinician: Referring Zechariah Bissonnette: Treating Kadedra Vanaken/Extender: [name] Visit Information History Since Last Visit Added or deleted any medications: No Patient Arrived: Wheel Chair Any new allergies or adverse reactions: No Arrival Time: 11:00 Had a fall or experienced change in No Accompanied By: alone activities of daily living that may affect Transfer Assistance: None risk of falls: Patient Identification Verified: Yes Signs or symptoms of abuse/neglect since last visito No Patient Requires Transmission-Based Precautions: No Hospitalized since last visit: No Patient Has Alerts: No Implantable device outside of the clinic excluding No cellular tissue based products placed in the center since last visit: Has Dressing in Place as Prescribed: Yes Pain Present Now: No Electronic Signature(s) Signed: 04/29/2021 5:02:47 PM By: Dellie Catholic RN Entered By: Dellie Catholic on 04/29/2021 11:02:15 -------------------------------------------------------------------------------- Clinic Level of Care Assessment Details Patient Name: Date of Service: [name] [facility] [place]. 04/29/2021 10:30 A M Medical Record Number: [id number] Patient Account Number: [id number] Date of Birth/Sex: Treating RN: 01-23-1981 (40 y.o. John Figueroa, John Figueroa Primary Care Kylil Swopes: Cathlean Cower Other Clinician: Referring Travaris Kosh: Treating Nanami Whitelaw/Extender: [name] Clinic Level of Care Assessment Items TOOL 4 Quantity Score X- 1 0 Use when only an EandM is performed on FOLLOW-UP visit ASSESSMENTS -  Nursing Assessment / Reassessment X- 1 10 Reassessment of Co-morbidities (includes updates in patient status) X- 1 5 Reassessment of Adherence to Treatment Plan ASSESSMENTS - Wound and Skin A ssessment / Reassessment X - Simple Wound Assessment / Reassessment - one wound 1 5 []  - 0 Complex Wound Assessment / Reassessment - multiple wounds []  - 0 Dermatologic / Skin Assessment (not related to wound area) ASSESSMENTS - Focused Assessment []  - 0 Circumferential Edema Measurements - multi extremities []  - 0 Nutritional Assessment / Counseling / Intervention []  - 0 Lower Extremity Assessment (monofilament, tuning fork, pulses) []  - 0 Peripheral Arterial Disease Assessment (using hand held doppler) ASSESSMENTS - Ostomy and/or Continence Assessment and Care []  - 0 Incontinence Assessment and Management []  - 0 Ostomy Care Assessment and Management (repouching, etc.) PROCESS - Coordination of Care X - Simple Patient / Family Education for ongoing care 1 15 []  - 0 Complex (extensive) Patient / Family Education for ongoing care X- 1 10 Staff obtains Programmer, systems, Records, T Results / Process Orders est X- 1 10 Staff telephones HHA, Nursing Homes / Clarify orders / etc []  - 0 Routine Transfer to another Facility (non-emergent condition) []  - 0 Routine Hospital Admission (non-emergent condition) []  - 0 New Admissions / Biomedical engineer / Ordering NPWT Apligraf, etc. , []  - 0 Emergency Hospital Admission (emergent condition) X- 1 10 Simple Discharge Coordination []  - 0 Complex (extensive) Discharge Coordination PROCESS - Special Needs []  - 0 Pediatric / Minor Patient Management []  - 0 Isolation Patient Management []  - 0 Hearing / Language / Visual special needs []  - 0 Assessment of Community assistance (transportation, D/C planning, etc.) []  - 0 Additional assistance / Altered mentation []  - 0 Support Surface(s) Assessment (bed, cushion, seat, etc.) INTERVENTIONS -  Wound Cleansing / Measurement X - Simple Wound Cleansing - one wound 1 5 []  - 0 Complex Wound Cleansing -  multiple wounds X- 1 5 Wound Imaging (photographs - any number of wounds) []  - 0 Wound Tracing (instead of photographs) X- 1 5 Simple Wound Measurement - one wound []  - 0 Complex Wound Measurement - multiple wounds INTERVENTIONS - Wound Dressings []  - 0 Small Wound Dressing one or multiple wounds X- 1 15 Medium Wound Dressing one or multiple wounds []  - 0 Large Wound Dressing one or multiple wounds []  - 0 Application of Medications - topical []  - 0 Application of Medications - injection INTERVENTIONS - Miscellaneous []  - 0 External ear exam []  - 0 Specimen Collection (cultures, biopsies, blood, body fluids, etc.) []  - 0 Specimen(s) / Culture(s) sent or taken to Lab for analysis []  - 0 Patient Transfer (multiple staff / Civil Service fast streamer / Similar devices) []  - 0 Simple Staple / Suture removal (25 or less) []  - 0 Complex Staple / Suture removal (26 or more) []  - 0 Hypo / Hyperglycemic Management (close monitor of Blood Glucose) []  - 0 Ankle / Brachial Index (ABI) - do not check if billed separately X- 1 5 Vital Signs Has the patient been seen at the hospital within the last three years: Yes Total Score: 100 Level Of Care: New/Established - Level 3 Electronic Signature(s) Signed: 04/29/2021 4:50:32 PM By: Rhae Hammock RN Entered By: Rhae Hammock on 04/29/2021 11:42:28 -------------------------------------------------------------------------------- Encounter Discharge Information Details Patient Name: Date of Service: [name] [facility] [place] M. 04/29/2021 10:30 A M Medical Record Number: [id number] Patient Account Number: [id number] Date of Birth/Sex: Treating RN: May 13, 1981 (40 y.o. John Figueroa, John Figueroa Primary Care Ilham Roughton: Cathlean Cower Other Clinician: Referring Kimari Lienhard: Treating Yecenia Dalgleish/Extender: [name] Encounter  Discharge Information Items Discharge Condition: Stable Ambulatory Status: Wheelchair Discharge Destination: Home Transportation: Private Auto Accompanied By: self Schedule Follow-up Appointment: Yes Clinical Summary of Care: Patient Declined Electronic Signature(s) Signed: 04/29/2021 4:50:32 PM By: Rhae Hammock RN Entered By: Rhae Hammock on 04/29/2021 11:48:00 -------------------------------------------------------------------------------- Lower Extremity Assessment Details Patient Name: Date of Service: [name] [facility] [place] M. 04/29/2021 10:30 A M Medical Record Number: [id number] Patient Account Number: [id number] Date of Birth/Sex: Treating RN: Apr 25, 1981 (40 y.o. John Figueroa Primary Care Mischell Branford: Cathlean Cower Other Clinician: Referring Aysha Livecchi: Treating Valisa Karpel/Extender: [name] Electronic Signature(s) Signed: 04/29/2021 5:02:47 PM By: Dellie Catholic RN Signed: 04/30/2021 5:51:35 PM By: Levan Hurst RN, BSN Entered By: Dellie Catholic on 04/29/2021 11:10:04 -------------------------------------------------------------------------------- Multi Wound Chart Details Patient Name: Date of Service: [name] [facility] [place] M. 04/29/2021 10:30 A M Medical Record Number: [id number] Patient Account Number: [id number] Date of Birth/Sex: Treating RN: 1981/02/27 (40 y.o. John Figueroa Primary Care Rosbel Buckner: Cathlean Cower Other Clinician: Referring Aylen Stradford: Treating Lacrystal Barbe/Extender: [name] Vital Signs Height(in): 78 Pulse(bpm): 99 Weight(lbs): 170 Blood Pressure(mmHg): 134/86 Body Mass Index(BMI): 20 Temperature(F): 98.4 Respiratory Rate(breaths/min): 18 Photos: [7:No Photos Left Ischium] [N/A:N/A N/A] Wound Location: [7:Pressure Injury] [N/A:N/A] Wounding Event: [7:Pressure Ulcer] [N/A:N/A] Primary Etiology: [7:Tobacco Use, Paraplegia] [N/A:N/A] Comorbid History:  [7:06/28/2020] [N/A:N/A] Date Acquired: [7:5] [N/A:N/A] Weeks of Treatment: [7:Open] [N/A:N/A] Wound Status: [7:7.5x8.9x7] [N/A:N/A] Measurements L x W x D (cm) [7:52.425] [N/A:N/A] A (cm) : rea [7:366.977] [N/A:N/A] Volume (cm) : [7:19.40%] [N/A:N/A] % Reduction in A rea: [7:13.20%] [N/A:N/A] % Reduction in Volume: [7:Category/Stage IV] [N/A:N/A] Classification: [7:Large] [N/A:N/A] Exudate A mount: [7:Serosanguineous] [N/A:N/A] Exudate Type: [7:red, brown] [N/A:N/A] Exudate Color: [7:Distinct, outline attached] [N/A:N/A] Wound Margin: [7:Large (67-100%)] [N/A:N/A] Granulation  A mount: [7:Pink] [N/A:N/A] Granulation Quality: [7:Small (1-33%)] [N/A:N/A] Necrotic A mount: [7:Fat Layer (Subcutaneous Tissue): Yes N/A] Exposed Structures: [7:Tendon: Yes Muscle: Yes Fascia: No Joint: No Bone: No None] [N/A:N/A] Treatment Notes Electronic Signature(s) Signed: 04/30/2021 5:02:06 PM By: Baltazar Najjar MD Signed: 04/30/2021 5:51:35 PM By: Zandra Abts RN, BSN Entered By: Baltazar Najjar on 04/29/2021 11:34:02 -------------------------------------------------------------------------------- Multi-Disciplinary Care Plan Details Patient Name: Date of Service: Beecher Mcardle RE, A [facility] [place] M. 04/29/2021 10:30 A M Medical Record Number: [id number] Patient Account Number: [id number] Date of Birth/Sex: Treating RN: Apr 28, 1981 (40 y.o. [name], John Figueroa Primary Care Orey Moure: Oliver Barre Other Clinician: Referring Jyden Kromer: Treating Jaren Kearn/Extender: Carley Hammed in Treatment: 5 Active Inactive Wound/Skin Impairment Nursing Diagnoses: Impaired tissue integrity Knowledge deficit related to ulceration/compromised skin integrity Goals: Patient/caregiver will verbalize understanding of skin care regimen Date Initiated: 03/25/2021 Target Resolution Date: 04/28/2021 Goal Status: Active Ulcer/skin breakdown will have a volume reduction of 30% by week 4 Date Initiated:  03/25/2021 Target Resolution Date: 04/29/2021 Goal Status: Active Ulcer/skin breakdown will have a volume reduction of 50% by week 8 Date Initiated: 03/25/2021 Target Resolution Date: 05/01/2021 Goal Status: Active Interventions: Assess patient/caregiver ability to obtain necessary supplies Notes: Electronic Signature(s) Signed: 04/29/2021 4:50:32 PM By: Fonnie Mu RN Entered By: Fonnie Mu on 04/29/2021 11:40:51 -------------------------------------------------------------------------------- Pain Assessment Details Patient Name: Date of Service: MO Alger [facility] [place] M. 04/29/2021 10:30 A M Medical Record Number: [id number] Patient Account Number: [id number] Date of Birth/Sex: Treating RN: Jan 15, 1981 (40 y.o. [name] Primary Care Holley Kocurek: Oliver Barre Other Clinician: Referring Dwight Burdo: Treating Stellar Gensel/Extender: Carley Hammed in Treatment: 5 Active Problems Location of Pain Severity and Description of Pain Patient Has Paino No Site Locations Rate the pain. Current Pain Level: 0 Pain Management and Medication Current Pain Management: Medication: No Cold Application: No Rest: No Massage: No Activity: No T.E.N.S.: No Heat Application: No Leg drop or elevation: No Is the Current Pain Management Adequate: Inadequate How does your wound impact your activities of daily livingo Sleep: No Bathing: No Appetite: No Relationship With Others: No Bladder Continence: No Emotions: No Bowel Continence: No Work: No Toileting: No Drive: No Dressing: No Hobbies: No Electronic Signature(s) Signed: 04/29/2021 5:02:47 PM By: Karie Schwalbe RN Signed: 04/30/2021 5:51:35 PM By: Zandra Abts RN, BSN Entered By: Karie Schwalbe on 04/29/2021 11:04:28 -------------------------------------------------------------------------------- Patient/Caregiver Education Details Patient Name: Date of Service: Alm Bustard [place]. 11/2/2022andnbsp10:30 A  M Medical Record Number: [id number] Patient Account Number: [id number] Date of Birth/Gender: Treating RN: 07/29/1980 (40 y.o. Lucious Groves Primary Care Physician: Oliver Barre Other Clinician: Referring Physician: Treating Physician/Extender: Carley Hammed in Treatment: 5 Education Assessment Education Provided To: Patient Education Topics Provided Wound/Skin Impairment: Methods: Explain/Verbal Responses: Reinforcements needed, State content correctly Electronic Signature(s) Signed: 04/29/2021 4:50:32 PM By: Fonnie Mu RN Entered By: Fonnie Mu on 04/29/2021 11:41:45 -------------------------------------------------------------------------------- Wound Assessment Details Patient Name: Date of Service: [name] [place] M. 04/29/2021 10:30 A M Medical Record Number: [id number] Patient Account Number: [id number] Date of Birth/Sex: Treating RN: November 28, 1980 (40 y.o. [name] Primary Care Shin Lamour: Oliver Barre Other Clinician: Referring Shaindel Sweeten: Treating Delmont Prosch/Extender: Carley Hammed in Treatment: 5 Wound Status Wound Number: 7 Primary Etiology: Pressure Ulcer Wound Location: Left Ischium Wound Status: Open Wounding Event: Pressure Injury Comorbid History: Tobacco Use, Paraplegia Date Acquired: 06/28/2020 Weeks Of Treatment: 5 Clustered Wound: No Wound Measurements Length: (cm) 7.5 Width: (cm) 8.9  Depth: (cm) 7 Area: (cm) 52.425 Volume: (cm) 366.977 Wound Description Classification: Category/Stage IV Wound Margin: Distinct, outline attached Exudate Amount: Large Exudate Type: Serosanguineous Exudate Color: red, brown Foul Odor After Cleansing: Slough/Fibrino % Reduction in Area: 19.4% % Reduction in Volume: 13.2% Epithelialization: None No Yes Wound Bed Granulation Amount: Large (67-100%) Exposed Structure Granulation Quality: Pink Fascia Exposed: No Necrotic Amount: Small (1-33%) Fat  Layer (Subcutaneous Tissue) Exposed: Yes Necrotic Quality: Adherent Slough Tendon Exposed: Yes Muscle Exposed: Yes Necrosis of Muscle: No Joint Exposed: No Bone Exposed: No Treatment Notes Wound #7 (Ischium) Wound Laterality: Left Cleanser Normal Saline Discharge Instruction: Cleanse the wound with Normal Saline prior to applying a clean dressing using gauze sponges, not tissue or cotton balls. Soap and Water Discharge Instruction: May shower and wash wound with dial antibacterial soap and water prior to dressing change. Peri-Wound Care Topical Primary Dressing Secondary Dressing Woven Gauze Sponge, Non-Sterile 4x4 in Discharge Instruction: Wet-to-dry in clinic ONLY. HH to change wound vac M W F ABD Pad, 5x9 Discharge Instruction: Apply over primary dressing as directed. Secured With 54M Medipore H Soft Cloth Surgical T 4 x 2 (in/yd) ape Discharge Instruction: Secure dressing with tape as directed. Compression Wrap Compression Stockings Add-Ons Electronic Signature(s) Signed: 04/29/2021 5:02:47 PM By: Dellie Catholic RN Signed: 04/30/2021 5:51:35 PM By: Levan Hurst RN, BSN Entered By: Dellie Catholic on 04/29/2021 11:21:13 -------------------------------------------------------------------------------- Vitals Details Patient Name: Date of Service: [name], [place] M. 04/29/2021 10:30 A M Medical Record Number: [id number] Patient Account Number: [id number] Date of Birth/Sex: Treating RN: [birth year] (40 y.o. John Figueroa Primary Care Natha Guin: Cathlean Cower Other Clinician: Referring Hanny Elsberry: Treating Jovann Luse/Extender: [name] Vital Signs Time Taken: 11:01 Temperature (F): 98.4 Height (in): 78 Pulse (bpm): 99 Weight (lbs): 170 Respiratory Rate (breaths/min): 18 Body Mass Index (BMI): 19.6 Blood Pressure (mmHg): 134/86 Reference Range: 80 - 120 mg / dl Electronic Signature(s) Signed: 04/29/2021 5:02:47 PM By:  Dellie Catholic RN Entered By: Dellie Catholic on 04/29/2021 11:03:00

## 2021-05-05 ENCOUNTER — Ambulatory Visit: Payer: Managed Care, Other (non HMO) | Admitting: Infectious Disease

## 2021-05-06 ENCOUNTER — Other Ambulatory Visit: Payer: Self-pay

## 2021-05-06 ENCOUNTER — Encounter: Payer: Self-pay | Admitting: Internal Medicine

## 2021-05-06 ENCOUNTER — Ambulatory Visit (INDEPENDENT_AMBULATORY_CARE_PROVIDER_SITE_OTHER): Payer: Managed Care, Other (non HMO) | Admitting: Internal Medicine

## 2021-05-06 VITALS — BP 168/90 | HR 104 | Temp 99.8°F | Ht 78.0 in | Wt 200.0 lb

## 2021-05-06 DIAGNOSIS — F32A Depression, unspecified: Secondary | ICD-10-CM

## 2021-05-06 DIAGNOSIS — R030 Elevated blood-pressure reading, without diagnosis of hypertension: Secondary | ICD-10-CM

## 2021-05-06 DIAGNOSIS — N529 Male erectile dysfunction, unspecified: Secondary | ICD-10-CM

## 2021-05-06 DIAGNOSIS — G8220 Paraplegia, unspecified: Secondary | ICD-10-CM

## 2021-05-06 DIAGNOSIS — L89329 Pressure ulcer of left buttock, unspecified stage: Secondary | ICD-10-CM | POA: Diagnosis not present

## 2021-05-06 DIAGNOSIS — R600 Localized edema: Secondary | ICD-10-CM

## 2021-05-06 MED ORDER — TADALAFIL 20 MG PO TABS: 10.0000 mg | ORAL_TABLET | ORAL | 11 refills | Status: AC | PRN

## 2021-05-06 NOTE — Assessment & Plan Note (Signed)
May need hosp bed and replacement wheelchair, pt to let us know about this

## 2021-05-06 NOTE — Assessment & Plan Note (Addendum)
With anxiety and significant ETOH use in past, at least chronic mild to mod, pt to f/u with psychiatry as he mentioned himself

## 2021-05-06 NOTE — Assessment & Plan Note (Addendum)
Pt to continue wound vax, has f/u with wound management tomorrow; also has ID f/u next tuesday

## 2021-05-06 NOTE — Assessment & Plan Note (Signed)
Ok for cialis prn,  to f/u any worsening symptoms or concerns  

## 2021-05-06 NOTE — Patient Instructions (Addendum)
Please take all new medication as prescribed  - the cialis as needed  You are given the new letter as requested today  Please continue all other medications as before, and refills have been done if requested.  Please have the pharmacy call with any other refills you may need.  Please continue your efforts at being more active, low cholesterol diet, and weight control.  Please keep your appointments with your specialists as you may have planned - wound care tomorrow, infectious disease next week, and psychiatry soon  Please make an Appointment to return in 6 months, or sooner if needed

## 2021-05-06 NOTE — Assessment & Plan Note (Signed)
Noted again today, pt states BP at home < 140/90, will defer to pt, hold on further BP med tx at this time  BP Readings from Last 3 Encounters:  05/06/21 (!) 168/90  04/15/21 130/74  03/20/21 (!) 142/79
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
## 2021-05-06 NOTE — Assessment & Plan Note (Signed)
Chronic persistent venous insufficiency - for letter as pt requested

## 2021-05-07 ENCOUNTER — Encounter (HOSPITAL_BASED_OUTPATIENT_CLINIC_OR_DEPARTMENT_OTHER): Payer: Managed Care, Other (non HMO) | Admitting: Internal Medicine

## 2021-05-13 ENCOUNTER — Ambulatory Visit: Payer: Managed Care, Other (non HMO) | Admitting: Infectious Disease

## 2021-05-14 ENCOUNTER — Encounter (HOSPITAL_BASED_OUTPATIENT_CLINIC_OR_DEPARTMENT_OTHER): Payer: Managed Care, Other (non HMO) | Admitting: Internal Medicine

## 2021-05-14 ENCOUNTER — Other Ambulatory Visit: Payer: Self-pay

## 2021-05-14 DIAGNOSIS — L89324 Pressure ulcer of left buttock, stage 4: Secondary | ICD-10-CM | POA: Diagnosis not present
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
## 2021-05-14 NOTE — Progress Notes (Signed)
BOSS, DANIELSEN ([id number]) Visit Report for 05/14/2021 Arrival Information Details Patient Name: Date of Service: [name] [employer] [place]. 05/14/2021 12:30 PM Medical Record Number: [id number] Patient Account Number: [id number] Date of Birth/Sex: Treating RN: [birth year] (40 y.o. [name], [name] Primary Care Dani Danis: Oliver Barre Other Clinician: Referring Shelah Heatley: Treating Maxi Rodas/Extender: Carley Hammed in Treatment: 7 Visit Information History Since Last Visit Added or deleted any medications: No Patient Arrived: Wheel Chair Any new allergies or adverse reactions: No Arrival Time: 12:45 Had a fall or experienced change in No Accompanied By: self activities of daily living that may affect Transfer Assistance: Manual risk of falls: Patient Identification Verified: Yes Signs or symptoms of abuse/neglect since last visito No Secondary Verification Process Completed: Yes Hospitalized since last visit: No Patient Requires Transmission-Based Precautions: No Implantable device outside of the clinic excluding No Patient Has Alerts: No cellular tissue based products placed in the center since last visit: Has Dressing in Place as Prescribed: Yes Pain Present Now: No Electronic Signature(s) Signed: 05/14/2021 5:03:29 PM By: Fonnie Mu RN Entered By: Fonnie Mu on 05/14/2021 12:48:00 -------------------------------------------------------------------------------- Clinic Level of Care Assessment Details Patient Name: Date of Service: [name] [facility] [place]. 05/14/2021 12:30 PM Medical Record Number: [id number] Patient Account Number: [id number] Date of Birth/Sex: Treating RN: 12/29/1980 (40 y.o. [name], [name] Primary Care Asyah Candler: Oliver Barre Other Clinician: Referring Abbagayle Zaragoza: Treating Deya Bigos/Extender: Carley Hammed in Treatment: 7 Clinic Level of Care Assessment Items TOOL 4 Quantity Score X- 1 0 Use when only  an EandM is performed on FOLLOW-UP visit ASSESSMENTS - Nursing Assessment / Reassessment X- 1 10 Reassessment of Co-morbidities (includes updates in patient status) X- 1 5 Reassessment of Adherence to Treatment Plan ASSESSMENTS - Wound and Skin A ssessment / Reassessment X - Simple Wound Assessment / Reassessment - one wound 1 5 []  - 0 Complex Wound Assessment / Reassessment - multiple wounds []  - 0 Dermatologic / Skin Assessment (not related to wound area) ASSESSMENTS - Focused Assessment []  - 0 Circumferential Edema Measurements - multi extremities []  - 0 Nutritional Assessment / Counseling / Intervention []  - 0 Lower Extremity Assessment (monofilament, tuning fork, pulses) []  - 0 Peripheral Arterial Disease Assessment (using hand held doppler) ASSESSMENTS - Ostomy and/or Continence Assessment and Care []  - 0 Incontinence Assessment and Management []  - 0 Ostomy Care Assessment and Management (repouching, etc.) PROCESS - Coordination of Care []  - 0 Simple Patient / Family Education for ongoing care X- 1 20 Complex (extensive) Patient / Family Education for ongoing care X- 1 10 Staff obtains , Records, T Results / Process Orders est X- 1 10 Staff telephones HHA, Nursing Homes / Clarify orders / etc []  - 0 Routine Transfer to another Facility (non-emergent condition) []  - 0 Routine Hospital Admission (non-emergent condition) []  - 0 New Admissions / / Ordering NPWT Apligraf, etc. , []  - 0 Emergency Hospital Admission (emergent condition) []  - 0 Simple Discharge Coordination X- 1 15 Complex (extensive) Discharge Coordination PROCESS - Special Needs []  - 0 Pediatric / Minor Patient Management []  - 0 Isolation Patient Management []  - 0 Hearing / Language / Visual special needs []  - 0 Assessment of Community assistance (transportation, D/C planning, etc.) []  - 0 Additional assistance / Altered mentation []  - 0 Support Surface(s)  Assessment (bed, cushion, seat, etc.) INTERVENTIONS - Wound Cleansing / Measurement X - Simple Wound Cleansing - one wound 1 5 []  - 0 Complex  Wound Cleansing - multiple wounds X- 1 5 Wound Imaging (photographs - any number of wounds) []  - 0 Wound Tracing (instead of photographs) X- 1 5 Simple Wound Measurement - one wound []  - 0 Complex Wound Measurement - multiple wounds INTERVENTIONS - Wound Dressings []  - 0 Small Wound Dressing one or multiple wounds X- 1 15 Medium Wound Dressing one or multiple wounds []  - 0 Large Wound Dressing one or multiple wounds []  - 0 Application of Medications - topical []  - 0 Application of Medications - injection INTERVENTIONS - Miscellaneous []  - 0 External ear exam []  - 0 Specimen Collection (cultures, biopsies, blood, body fluids, etc.) []  - 0 Specimen(s) / Culture(s) sent or taken to Lab for analysis []  - 0 Patient Transfer (multiple staff / Civil Service fast streamer / Similar devices) []  - 0 Simple Staple / Suture removal (25 or less) []  - 0 Complex Staple / Suture removal (26 or more) []  - 0 Hypo / Hyperglycemic Management (close monitor of Blood Glucose) []  - 0 Ankle / Brachial Index (ABI) - do not check if billed separately X- 1 5 Vital Signs Has the patient been seen at the hospital within the last three years: Yes Total Score: 110 Level Of Care: New/Established - Level 3 Electronic Signature(s) Signed: 05/14/2021 5:03:29 PM By: Rhae Hammock RN Entered By: Rhae Hammock on 05/14/2021 13:05:31 -------------------------------------------------------------------------------- Encounter Discharge Information Details Patient Name: Date of Service: [name] [facility] [place] M. 05/14/2021 12:30 PM Medical Record Number: [id number] Patient Account Number: [id number] Date of Birth/Sex: Treating RN: 1980/12/25 (40 y.o. [name], [name] Primary Care Jazier Mcglamery: Cathlean Cower Other Clinician: Referring Masami Plata: Treating Peng Thorstenson/Extender: [name] in Treatment: 7 Encounter Discharge Information Items Discharge Condition: Stable Ambulatory Status: Wheelchair Discharge Destination: Home Transportation: Private Auto Accompanied By: self Schedule Follow-up Appointment: Yes Clinical Summary of Care: Patient Declined Electronic Signature(s) Signed: 05/14/2021 5:03:29 PM By: Rhae Hammock RN Entered By: Rhae Hammock on 05/14/2021 13:08:38 -------------------------------------------------------------------------------- Lower Extremity Assessment Details Patient Name: Date of Service: [name] [facility] [place] M. 05/14/2021 12:30 PM Medical Record Number: [id number] Patient Account Number: [id number] Date of Birth/Sex: Treating RN: 1981/02/15 (40 y.o. Erie Noe Primary Care Kerensa Nicklas: Cathlean Cower Other Clinician: Referring Sheria Rosello: Treating Tessy Pawelski/Extender: [name] in Treatment: 7 Electronic Signature(s) Signed: 05/14/2021 5:03:29 PM By: Rhae Hammock RN Entered By: Rhae Hammock on 05/14/2021 12:49:22 -------------------------------------------------------------------------------- Multi Wound Chart Details Patient Name: Date of Service: [name] [facility] [place] M. 05/14/2021 12:30 PM Medical Record Number: [id number] Patient Account Number: [id number] Date of Birth/Sex: Treating RN: [birth year] (40 y.o. Hessie Diener Primary Care Harvin Konicek: Cathlean Cower Other Clinician: Referring Cesar Rogerson: Treating Kule Gascoigne/Extender: [name] in Treatment: 7 Vital Signs Height(in): 78 Pulse(bpm): 105 Weight(lbs): 170 Blood Pressure(mmHg): 134/75 Body Mass Index(BMI): 20 Temperature(F): 98.4 Respiratory Rate(breaths/min): 17 Photos: [N/A:N/A] Left Ischium N/A N/A Wound Location: Pressure Injury N/A N/A Wounding Event: Pressure Ulcer N/A N/A Primary Etiology: Tobacco Use, Paraplegia N/A N/A Comorbid History: 06/28/2020 N/A N/A Date  Acquired: 7 N/A N/A Weeks of Treatment: Open N/A N/A Wound Status: 8.7x8.5x7.5 N/A N/A Measurements L x W x D (cm) 58.08 N/A N/A A (cm) : rea 435.601 N/A N/A Volume (cm) : 10.70% N/A N/A % Reduction in A rea: -3.10% N/A N/A % Reduction in Volume: 12 Starting Position 1 (o'clock): 12 Ending Position 1 (o'clock): 8 Maximum Distance 1 (cm): Yes N/A N/A Undermining: Category/Stage IV N/A N/A Classification: Large N/A N/A  Exudate A mount: Serosanguineous N/A N/A Exudate Type: red, brown N/A N/A Exudate Color: Distinct, outline attached N/A N/A Wound Margin: Large (67-100%) N/A N/A Granulation A mount: Pink N/A N/A Granulation Quality: Small (1-33%) N/A N/A Necrotic A mount: Fat Layer (Subcutaneous Tissue): Yes N/A N/A Exposed Structures: Tendon: Yes Muscle: Yes Fascia: No Joint: No Bone: No None N/A N/A Epithelialization: Treatment Notes Wound #7 (Ischium) Wound Laterality: Left Cleanser Normal Saline Discharge Instruction: Cleanse the wound with Normal Saline prior to applying a clean dressing using gauze sponges, not tissue or cotton balls. Soap and Water Discharge Instruction: May shower and wash wound with dial antibacterial soap and water prior to dressing change. Peri-Wound Care Topical Primary Dressing Secondary Dressing Woven Gauze Sponge, Non-Sterile 4x4 in Discharge Instruction: Wet-to-dry in clinic ONLY. HH to change wound vac M W F ABD Pad, 5x9 Discharge Instruction: Apply over primary dressing as directed. Secured With 34M Medipore H Soft Cloth Surgical T 4 x 2 (in/yd) ape Discharge Instruction: Secure dressing with tape as directed. Compression Wrap Compression Stockings Add-Ons Electronic Signature(s) Signed: 05/14/2021 5:16:34 PM By: Linton Ham MD Signed: 05/14/2021 5:18:51 PM By: Deon Pilling RN, BSN Entered By: Linton Ham on 05/14/2021  13:13:32 -------------------------------------------------------------------------------- Multi-Disciplinary Care Plan Details Patient Name: Date of Service: [name] [facility] [place] M. 05/14/2021 12:30 PM Medical Record Number: [id number] Patient Account Number: [id number] Date of Birth/Sex: Treating RN: 18-May-1981 (40 y.o. [name], [name] Primary Care Yessica Putnam: Cathlean Cower Other Clinician: Referring Anakaren Campion: Treating Jourden Gilson/Extender: [name] in Treatment: 7 Active Inactive Wound/Skin Impairment Nursing Diagnoses: Impaired tissue integrity Knowledge deficit related to ulceration/compromised skin integrity Goals: Patient/caregiver will verbalize understanding of skin care regimen Date Initiated: 03/25/2021 Target Resolution Date: 04/28/2021 Goal Status: Active Ulcer/skin breakdown will have a volume reduction of 30% by week 4 Date Initiated: 03/25/2021 Target Resolution Date: 04/29/2021 Goal Status: Active Ulcer/skin breakdown will have a volume reduction of 50% by week 8 Date Initiated: 03/25/2021 Target Resolution Date: 05/01/2021 Goal Status: Active Interventions: Assess patient/caregiver ability to obtain necessary supplies Notes: Electronic Signature(s) Signed: 05/14/2021 5:03:29 PM By: Rhae Hammock RN Entered By: Rhae Hammock on 05/14/2021 13:03:03 -------------------------------------------------------------------------------- Pain Assessment Details Patient Name: Date of Service: [place] Jose [place] M. 05/14/2021 12:30 PM Medical Record Number: [id number] Patient Account Number: [id number] Date of Birth/Sex: Treating RN: [birth year] (40 y.o. Erie Noe Primary Care Jossette Zirbel: Other Clinician: Cathlean Cower Referring Kamon Fahr: Treating Erandi Lemma/Extender: [name] in Treatment: 7 Active Problems Location of Pain Severity and Description of Pain Patient Has Paino No Site Locations Pain  Management and Medication Current Pain Management: Electronic Signature(s) Signed: 05/14/2021 5:03:29 PM By: Rhae Hammock RN Entered By: Rhae Hammock on 05/14/2021 12:49:17 -------------------------------------------------------------------------------- Patient/Caregiver Education Details Patient Name: Date of Service: [place] Jose Persia [place]. 11/17/2022andnbsp12:30 PM Medical Record Number: [id number] Patient Account Number: [id number] Date of Birth/Gender: Treating RN: March 07, 1981 (40 y.o. Erie Noe Primary Care Physician: Cathlean Cower Other Clinician: Referring Physician: Treating Physician/Extender: [name] in Treatment: 7 Education Assessment Education Provided To: Patient Education Topics Provided Wound/Skin Impairment: Methods: Explain/Verbal Responses: State content correctly Electronic Signature(s) Signed: 05/14/2021 5:03:29 PM By: Rhae Hammock RN Entered By: Rhae Hammock on 05/14/2021 13:03:17 -------------------------------------------------------------------------------- Wound Assessment Details Patient Name: Date of Service: [place] Jose Persia [place] M. 05/14/2021 12:30 PM Medical Record Number: [id number] Patient Account Number: [id number] Date of Birth/Sex: Treating RN: May 23, 1981 (40 y.o. Erie Noe Primary Care Dian Minahan:  Cathlean Cower Other Clinician: Referring Hideo Googe: Treating Emberlynn Riggan/Extender: [name] in Treatment: 7 Wound Status Wound Number: 7 Primary Etiology: Pressure Ulcer Wound Location: Left Ischium Wound Status: Open Wounding Event: Pressure Injury Comorbid History: Tobacco Use, Paraplegia Date Acquired: 06/28/2020 Weeks Of Treatment: 7 Clustered Wound: No Photos Wound Measurements Length: (cm) 8.7 Width: (cm) 8.5 Depth: (cm) 7.5 Area: (cm) 58.08 Volume: (cm) 435.601 % Reduction in Area: 10.7% % Reduction in Volume: -3.1% Epithelialization:  None Tunneling: No Undermining: Yes Starting Position (o'clock): 12 Ending Position (o'clock): 12 Maximum Distance: (cm) 8 Wound Description Classification: Category/Stage IV Wound Margin: Distinct, outline attached Exudate Amount: Large Exudate Type: Serosanguineous Exudate Color: red, brown Foul Odor After Cleansing: No Slough/Fibrino Yes Wound Bed Granulation Amount: Large (67-100%) Exposed Structure Granulation Quality: Pink Fascia Exposed: No Necrotic Amount: Small (1-33%) Fat Layer (Subcutaneous Tissue) Exposed: Yes Necrotic Quality: Adherent Slough Tendon Exposed: Yes Muscle Exposed: Yes Necrosis of Muscle: No Joint Exposed: No Bone Exposed: No Treatment Notes Wound #7 (Ischium) Wound Laterality: Left Cleanser Normal Saline Discharge Instruction: Cleanse the wound with Normal Saline prior to applying a clean dressing using gauze sponges, not tissue or cotton balls. Soap and Water Discharge Instruction: May shower and wash wound with dial antibacterial soap and water prior to dressing change. Peri-Wound Care Topical Primary Dressing Secondary Dressing Woven Gauze Sponge, Non-Sterile 4x4 in Discharge Instruction: Wet-to-dry in clinic ONLY. HH to change wound vac M W F ABD Pad, 5x9 Discharge Instruction: Apply over primary dressing as directed. Secured With 66M Medipore H Soft Cloth Surgical T 4 x 2 (in/yd) ape Discharge Instruction: Secure dressing with tape as directed. Compression Wrap Compression Stockings Add-Ons Electronic Signature(s) Signed: 05/14/2021 5:03:29 PM By: Rhae Hammock RN Entered By: Rhae Hammock on 05/14/2021 13:01:11 -------------------------------------------------------------------------------- Vitals Details Patient Name: Date of Service: [name] [facility] [place] M. 05/14/2021 12:30 PM Medical Record Number: [id number] Patient Account Number: [id number] Date of Birth/Sex: Treating RN: 1980-08-20 (40 y.o. [name],  [name] Primary Care Miyonna Ormiston: Cathlean Cower Other Clinician: Referring Arli Bree: Treating Bradee Common/Extender: [name] in Treatment: 7 Vital Signs Time Taken: 12:48 Temperature (F): 98.4 Height (in): 78 Pulse (bpm): 105 Weight (lbs): 170 Respiratory Rate (breaths/min): 17 Body Mass Index (BMI): 19.6 Blood Pressure (mmHg): 134/75 Reference Range: 80 - 120 mg / dl Electronic Signature(s) Signed: 05/14/2021 5:03:29 PM By: Rhae Hammock RN Entered By: Rhae Hammock on 05/14/2021 12:48:21

## 2021-06-03 ENCOUNTER — Other Ambulatory Visit: Payer: Self-pay

## 2021-06-03 ENCOUNTER — Ambulatory Visit (INDEPENDENT_AMBULATORY_CARE_PROVIDER_SITE_OTHER): Payer: Managed Care, Other (non HMO) | Admitting: Infectious Disease

## 2021-06-03 VITALS — BP 133/9 | HR 105 | Resp 16 | Ht 77.0 in | Wt 200.0 lb

## 2021-06-03 DIAGNOSIS — N3001 Acute cystitis with hematuria: Secondary | ICD-10-CM

## 2021-06-03 DIAGNOSIS — M86652 Other chronic osteomyelitis, left thigh: Secondary | ICD-10-CM | POA: Diagnosis not present

## 2021-06-03 DIAGNOSIS — N390 Urinary tract infection, site not specified: Secondary | ICD-10-CM

## 2021-06-03 DIAGNOSIS — N493 Fournier gangrene: Secondary | ICD-10-CM | POA: Diagnosis not present

## 2021-06-03 DIAGNOSIS — A4902 Methicillin resistant Staphylococcus aureus infection, unspecified site: Secondary | ICD-10-CM

## 2021-06-03 DIAGNOSIS — A429 Actinomycosis, unspecified: Secondary | ICD-10-CM

## 2021-06-03 DIAGNOSIS — M726 Necrotizing fasciitis: Secondary | ICD-10-CM

## 2021-06-03 MED ORDER — AMOXICILLIN-POT CLAVULANATE 875-125 MG PO TABS: 1.0000 | ORAL_TABLET | Freq: Two times a day (BID) | ORAL | 5 refills | Status: DC

## 2021-06-03 MED ORDER — SULFAMETHOXAZOLE-TRIMETHOPRIM 800-160 MG PO TABS: 1.0000 | ORAL_TABLET | Freq: Two times a day (BID) | ORAL | 0 refills | Status: AC
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
## 2021-06-04 ENCOUNTER — Encounter (HOSPITAL_BASED_OUTPATIENT_CLINIC_OR_DEPARTMENT_OTHER): Payer: Managed Care, Other (non HMO) | Admitting: Internal Medicine

## 2021-06-04 LAB — COMPLETE METABOLIC PANEL WITH GFR
AG Ratio: 0.5 (calc) — ABNORMAL LOW (ref 1.0–2.5)
ALT: 4 U/L — ABNORMAL LOW (ref 9–46)
AST: 10 U/L (ref 10–40)
Albumin: 2.6 g/dL — ABNORMAL LOW (ref 3.6–5.1)
Alkaline phosphatase (APISO): 80 U/L (ref 36–130)
BUN: 9 mg/dL (ref 7–25)
CO2: 27 mmol/L (ref 20–32)
Calcium: 8.6 mg/dL (ref 8.6–10.3)
Chloride: 99 mmol/L (ref 98–110)
Creat: 0.6 mg/dL (ref 0.60–1.29)
Globulin: 4.8 g/dL (calc) — ABNORMAL HIGH (ref 1.9–3.7)
Glucose, Bld: 80 mg/dL (ref 65–99)
Potassium: 3.3 mmol/L — ABNORMAL LOW (ref 3.5–5.3)
Sodium: 136 mmol/L (ref 135–146)
Total Bilirubin: 0.5 mg/dL (ref 0.2–1.2)
Total Protein: 7.4 g/dL (ref 6.1–8.1)
eGFR: 124 mL/min/{1.73_m2} (ref >=60)

## 2021-06-04 LAB — URINALYSIS, ROUTINE W REFLEX MICROSCOPIC
Bilirubin Urine: NEGATIVE
Glucose, UA: NEGATIVE
Hgb urine dipstick: NEGATIVE
Hyaline Cast: NONE SEEN /LPF
Nitrite: NEGATIVE
Specific Gravity, Urine: 1.028 (ref 1.001–1.035)
pH: 6 (ref 5.0–8.0)

## 2021-06-04 LAB — CBC WITH DIFFERENTIAL/PLATELET
Absolute Monocytes: 1075 cells/uL — ABNORMAL HIGH (ref 200–950)
Basophils Absolute: 26 cells/uL (ref 0–200)
Basophils Relative: 0.2 %
Eosinophils Absolute: 269 cells/uL (ref 15–500)
Eosinophils Relative: 2.1 %
HCT: 32.4 % — ABNORMAL LOW (ref 38.5–50.0)
Hemoglobin: 10.1 g/dL — ABNORMAL LOW (ref 13.2–17.1)
Lymphs Abs: 973 cells/uL (ref 850–3900)
MCH: 26.6 pg — ABNORMAL LOW (ref 27.0–33.0)
MCHC: 31.2 g/dL — ABNORMAL LOW (ref 32.0–36.0)
MCV: 85.3 fL (ref 80.0–100.0)
MPV: 9.6 fL (ref 7.5–12.5)
Monocytes Relative: 8.4 %
Neutro Abs: 10458 cells/uL — ABNORMAL HIGH (ref 1500–7800)
Neutrophils Relative %: 81.7 %
Platelets: 506 10*3/uL — ABNORMAL HIGH (ref 140–400)
RBC: 3.8 10*6/uL — ABNORMAL LOW (ref 4.20–5.80)
RDW: 15.6 % — ABNORMAL HIGH (ref 11.0–15.0)
Total Lymphocyte: 7.6 %
WBC: 12.8 10*3/uL — ABNORMAL HIGH (ref 3.8–10.8)

## 2021-06-04 LAB — SED RATE MANUAL WEST RFLX: SED RATE BY MODIFIED WESTERGREN,MANUAL: 38 mm/h — ABNORMAL HIGH (ref 0–15)

## 2021-06-04 LAB — URINE CULTURE
MICRO NUMBER:: 12727136
SPECIMEN QUALITY:: ADEQUATE

## 2021-06-04 LAB — SEDIMENTATION RATE

## 2021-06-04 LAB — C-REACTIVE PROTEIN: CRP: 189.3 mg/L — ABNORMAL HIGH (ref <8.0)

## 2021-06-09 ENCOUNTER — Encounter (HOSPITAL_BASED_OUTPATIENT_CLINIC_OR_DEPARTMENT_OTHER): Payer: Managed Care, Other (non HMO) | Attending: Internal Medicine | Admitting: Internal Medicine

## 2021-06-09 ENCOUNTER — Other Ambulatory Visit: Payer: Self-pay

## 2021-06-09 DIAGNOSIS — T8131XD Disruption of external operation (surgical) wound, not elsewhere classified, subsequent encounter: Secondary | ICD-10-CM | POA: Diagnosis not present

## 2021-06-09 DIAGNOSIS — Y939 Activity, unspecified: Secondary | ICD-10-CM | POA: Diagnosis not present

## 2021-06-09 DIAGNOSIS — L89324 Pressure ulcer of left buttock, stage 4: Secondary | ICD-10-CM | POA: Insufficient documentation

## 2021-06-09 DIAGNOSIS — G8221 Paraplegia, complete: Secondary | ICD-10-CM | POA: Diagnosis not present

## 2021-06-09 DIAGNOSIS — M8638 Chronic multifocal osteomyelitis, other site: Secondary | ICD-10-CM | POA: Diagnosis not present

## 2021-06-09 DIAGNOSIS — X58XXXA Exposure to other specified factors, initial encounter: Secondary | ICD-10-CM | POA: Diagnosis not present
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
## 2021-06-09 NOTE — Progress Notes (Signed)
LANDERS, SUR ([id number]) Visit Report for 06/09/2021 Arrival Information Details Patient Name: Date of Service: [name] [facility] [place]. 06/09/2021 1:00 PM Medical Record Number: [id number] Patient Account Number: [id number] Date of Birth/Sex: Treating RN: [birth year] (40 y.o. [name] Primary Care Kambri Dismore: Cathlean Cower Other Clinician: Referring Vrishank Moster: Treating Payal Stanforth/Extender: [name] in Treatment: 10 Visit Information History Since Last Visit Added or deleted any medications: No Patient Arrived: Wheel Chair Any new allergies or adverse reactions: No Arrival Time: 13:15 Had a fall or experienced change in No Accompanied By: alone activities of daily living that may affect Transfer Assistance: Manual risk of falls: Patient Identification Verified: Yes Signs or symptoms of abuse/neglect since last visito No Secondary Verification Process Completed: Yes Hospitalized since last visit: No Patient Requires Transmission-Based Precautions: No Implantable device outside of the clinic excluding No Patient Has Alerts: No cellular tissue based products placed in the center since last visit: Has Dressing in Place as Prescribed: Yes Pain Present Now: No Electronic Signature(s) Signed: 06/09/2021 4:51:20 PM By: Levan Hurst RN, BSN Entered By: Levan Hurst on 06/09/2021 13:15:58 -------------------------------------------------------------------------------- Clinic Level of Care Assessment Details Patient Name: Date of Service: [name] [facility] [place] M. 06/09/2021 1:00 PM Medical Record Number: [id number] Patient Account Number: [id number] Date of Birth/Sex: Treating RN: [birth year] (40 y.o. [name] Primary Care Karmen Altamirano: Cathlean Cower Other Clinician: Referring Dhiren Azimi: Treating Laithan Conchas/Extender: [name] in Treatment: 10 Clinic Level of Care Assessment Items TOOL 4 Quantity Score X- 1 0 Use when only an  EandM is performed on FOLLOW-UP visit ASSESSMENTS - Nursing Assessment / Reassessment X- 1 10 Reassessment of Co-morbidities (includes updates in patient status) X- 1 5 Reassessment of Adherence to Treatment Plan ASSESSMENTS - Wound and Skin A ssessment / Reassessment X - Simple Wound Assessment / Reassessment - one wound 1 5 []  - 0 Complex Wound Assessment / Reassessment - multiple wounds []  - 0 Dermatologic / Skin Assessment (not related to wound area) ASSESSMENTS - Focused Assessment []  - 0 Circumferential Edema Measurements - multi extremities []  - 0 Nutritional Assessment / Counseling / Intervention []  - 0 Lower Extremity Assessment (monofilament, tuning fork, pulses) []  - 0 Peripheral Arterial Disease Assessment (using hand held doppler) ASSESSMENTS - Ostomy and/or Continence Assessment and Care []  - 0 Incontinence Assessment and Management []  - 0 Ostomy Care Assessment and Management (repouching, etc.) PROCESS - Coordination of Care X - Simple Patient / Family Education for ongoing care 1 15 []  - 0 Complex (extensive) Patient / Family Education for ongoing care X- 1 10 Staff obtains Programmer, systems, Records, T Results / Process Orders est X- 1 10 Staff telephones HHA, Nursing Homes / Clarify orders / etc []  - 0 Routine Transfer to another Facility (non-emergent condition) []  - 0 Routine Hospital Admission (non-emergent condition) []  - 0 New Admissions / Biomedical engineer / Ordering NPWT Apligraf, etc. , []  - 0 Emergency Hospital Admission (emergent condition) X- 1 10 Simple Discharge Coordination []  - 0 Complex (extensive) Discharge Coordination PROCESS - Special Needs []  - 0 Pediatric / Minor Patient Management []  - 0 Isolation Patient Management []  - 0 Hearing / Language / Visual special needs []  - 0 Assessment of Community assistance (transportation, D/C planning, etc.) []  - 0 Additional assistance / Altered mentation []  - 0 Support Surface(s)  Assessment (bed, cushion, seat, etc.) INTERVENTIONS - Wound Cleansing / Measurement X - Simple Wound Cleansing - one wound 1 5 []  -  0 Complex Wound Cleansing - multiple wounds X- 1 5 Wound Imaging (photographs - any number of wounds) []  - 0 Wound Tracing (instead of photographs) X- 1 5 Simple Wound Measurement - one wound []  - 0 Complex Wound Measurement - multiple wounds INTERVENTIONS - Wound Dressings []  - 0 Small Wound Dressing one or multiple wounds X- 1 15 Medium Wound Dressing one or multiple wounds []  - 0 Large Wound Dressing one or multiple wounds []  - 0 Application of Medications - topical []  - 0 Application of Medications - injection INTERVENTIONS - Miscellaneous []  - 0 External ear exam []  - 0 Specimen Collection (cultures, biopsies, blood, body fluids, etc.) []  - 0 Specimen(s) / Culture(s) sent or taken to Lab for analysis []  - 0 Patient Transfer (multiple staff / Civil Service fast streamer / Similar devices) []  - 0 Simple Staple / Suture removal (25 or less) []  - 0 Complex Staple / Suture removal (26 or more) []  - 0 Hypo / Hyperglycemic Management (close monitor of Blood Glucose) []  - 0 Ankle / Brachial Index (ABI) - do not check if billed separately X- 1 5 Vital Signs Has the patient been seen at the hospital within the last three years: Yes Total Score: 100 Level Of Care: New/Established - Level 3 Electronic Signature(s) Signed: 06/09/2021 4:51:20 PM By: Levan Hurst RN, BSN Entered By: Levan Hurst on 06/09/2021 16:29:22 -------------------------------------------------------------------------------- Encounter Discharge Information Details Patient Name: Date of Service: [name], [place] M. 06/09/2021 1:00 PM Medical Record Number: [id number] Patient Account Number: [id number] Date of Birth/Sex: Treating RN: [birth year] (40 y.o. [name] Primary Care Angelys Yetman: Cathlean Cower Other Clinician: Referring Khai Arrona: Treating Isatu Macinnes/Extender: [name] in Treatment: 10 Encounter Discharge Information Items Discharge Condition: Stable Ambulatory Status: Wheelchair Discharge Destination: Home Transportation: Private Auto Accompanied By: alone Schedule Follow-up Appointment: Yes Clinical Summary of Care: Patient Declined Electronic Signature(s) Signed: 06/09/2021 4:51:20 PM By: Levan Hurst RN, BSN Entered By: Levan Hurst on 06/09/2021 16:30:06 -------------------------------------------------------------------------------- Multi Wound Chart Details Patient Name: Date of Service: [name] [facility] [place] M. 06/09/2021 1:00 PM Medical Record Number: [id number] Patient Account Number: [id number] Date of Birth/Sex: Treating RN: 1981/06/25 (40 y.o. [name] Primary Care Gregori Abril: Cathlean Cower Other Clinician: Referring Renato Spellman: Treating Niurka Benecke/Extender: [name] in Treatment: 10 Vital Signs Height(in): 78 Pulse(bpm): 104 Weight(lbs): 170 Blood Pressure(mmHg): 110/54 Body Mass Index(BMI): 20 Temperature(F): 98.0 Respiratory Rate(breaths/min): 16 Photos: [7:No Photos Left Ischium] [N/A:N/A N/A] Wound Location: [7:Pressure Injury] [N/A:N/A] Wounding Event: [7:Pressure Ulcer] [N/A:N/A] Primary Etiology: [7:Tobacco Use, Paraplegia] [N/A:N/A] Comorbid History: [7:06/28/2020] [N/A:N/A] Date Acquired: [7:10] [N/A:N/A] Weeks of Treatment: [7:Open] [N/A:N/A] Wound Status: [7:7x7.5x6.9] [N/A:N/A] Measurements L x W x D (cm) [7:41.233] [N/A:N/A] A (cm) : rea [7:284.51] [N/A:N/A] Volume (cm) : [7:36.60%] [N/A:N/A] % Reduction in A rea: [7:32.70%] [N/A:N/A] % Reduction in Volume: [7:7] Starting Position 1 (o'clock): [7:11] Ending Position 1 (o'clock): [7:7] Maximum Distance 1 (cm): [7:Yes] [N/A:N/A] Undermining: [7:Category/Stage IV] [N/A:N/A] Classification: [7:Large] [N/A:N/A] Exudate A mount: [7:Serosanguineous] [N/A:N/A] Exudate Type: [7:red, brown] [N/A:N/A] Exudate  Color: [7:Distinct, outline attached] [N/A:N/A] Wound Margin: [7:Large (67-100%)] [N/A:N/A] Granulation A mount: [7:Red, Pink] [N/A:N/A] Granulation Quality: [7:None Present (0%)] [N/A:N/A] Necrotic A mount: [7:Fat Layer (Subcutaneous Tissue): Yes N/A] Exposed Structures: [7:Muscle: Yes Fascia: No Tendon: No Joint: No Bone: No None] [N/A:N/A] Treatment Notes Electronic Signature(s) Signed: 06/09/2021 4:30:32 PM By: Linton Ham MD Signed: 06/09/2021 4:51:20 PM By: Levan Hurst RN, BSN Entered By: Linton Ham on 06/09/2021 13:51:06 --------------------------------------------------------------------------------  Multi-Disciplinary Care Plan Details Patient Name: Date of Service: [name] [facility] [place]. 06/09/2021 1:00 PM Medical Record Number: [id number] Patient Account Number: [id number] Date of Birth/Sex: Treating RN: [birth year] (40 y.o. [name] Primary Care Laren Whaling: Cathlean Cower Other Clinician: Referring Byanka Landrus: Treating Rohit Deloria/Extender: [name] in Treatment: 10 Active Inactive Wound/Skin Impairment Nursing Diagnoses: Impaired tissue integrity Knowledge deficit related to ulceration/compromised skin integrity Goals: Patient/caregiver will verbalize understanding of skin care regimen Date Initiated: 03/25/2021 Target Resolution Date: 07/10/2021 Goal Status: Active Interventions: Assess patient/caregiver ability to obtain necessary supplies Notes: Electronic Signature(s) Signed: 06/09/2021 4:51:20 PM By: Levan Hurst RN, BSN Entered By: Levan Hurst on 06/09/2021 13:32:58 -------------------------------------------------------------------------------- Pain Assessment Details Patient Name: Date of Service: [name] [facility] [place] M. 06/09/2021 1:00 PM Medical Record Number: [id number] Patient Account Number: [id number] Date of Birth/Sex: Treating RN: 20-Nov-1980 (40 y.o. [name] Primary Care Jozalyn Baglio: Cathlean Cower Other Clinician: Referring Rickardo Brinegar: Treating Myna Freimark/Extender: [name] in Treatment: 10 Active Problems Location of Pain Severity and Description of Pain Patient Has Paino No Site Locations Pain Management and Medication Current Pain Management: Electronic Signature(s) Signed: 06/09/2021 4:51:20 PM By: Levan Hurst RN, BSN Entered By: Levan Hurst on 06/09/2021 13:20:47 -------------------------------------------------------------------------------- Patient/Caregiver Education Details Patient Name: Date of Service: [name] [place]. 12/13/2022andnbsp1:00 PM Medical Record Number: [id number] Patient Account Number: [id number] Date of Birth/Gender: Treating RN: 1980-07-03 (40 y.o. [name] Primary Care Physician: Cathlean Cower Other Clinician: Referring Physician: Treating Physician/Extender: [name] in Treatment: 10 Education Assessment Education Provided To: Patient Education Topics Provided Wound/Skin Impairment: Methods: Explain/Verbal Responses: State content correctly [employer]) Signed: 06/09/2021 4:51:20 PM By: Levan Hurst RN, BSN Entered By: Levan Hurst on 06/09/2021 13:33:29 -------------------------------------------------------------------------------- Wound Assessment Details Patient Name: Date of Service: [name], [place]. 06/09/2021 1:00 PM Medical Record Number: [id number] Patient Account Number: [id number] Date of Birth/Sex: Treating RN: [birth year] (40 y.o. [name] Primary Care Cabot Cromartie: Cathlean Cower Other Clinician: Referring Carissa Musick: Treating Eiliana Drone/Extender: [name] in Treatment: 10 Wound Status Wound Number: 7 Primary Etiology: Pressure Ulcer Wound Location: Left Ischium Wound Status: Open Wounding Event: Pressure Injury Comorbid History: Tobacco Use, Paraplegia Date Acquired: 06/28/2020 Weeks Of Treatment:  10 Clustered Wound: No Wound Measurements Length: (cm) 7 Width: (cm) 7.5 Depth: (cm) 6.9 Area: (cm) 41.233 Volume: (cm) 284.51 % Reduction in Area: 36.6% % Reduction in Volume: 32.7% Epithelialization: None Tunneling: No Undermining: Yes Starting Position (o'clock): 7 Ending Position (o'clock): 11 Maximum Distance: (cm) 7 Wound Description Classification: Category/Stage IV Wound Margin: Distinct, outline attached Exudate Amount: Large Exudate Type: Serosanguineous Exudate Color: red, brown Foul Odor After Cleansing: No Slough/Fibrino Yes Wound Bed Granulation Amount: Large (67-100%) Exposed Structure Granulation Quality: Red, Pink Fascia Exposed: No Necrotic Amount: None Present (0%) Fat Layer (Subcutaneous Tissue) Exposed: Yes Tendon Exposed: No Muscle Exposed: Yes Necrosis of Muscle: No Joint Exposed: No Bone Exposed: No Treatment Notes Wound #7 (Ischium) Wound Laterality: Left Cleanser Normal Saline Discharge Instruction: Cleanse the wound with Normal Saline prior to applying a clean dressing using gauze sponges, not tissue or cotton balls. Soap and Water Discharge Instruction: May shower and wash wound with dial antibacterial soap and water prior to dressing change. Peri-Wound Care Topical Primary Dressing Wet-to-dry today in clinic Discharge Instruction: Avant to apply wound vac on next visit Secondary Dressing Woven Gauze Sponge, Non-Sterile 4x4 in Discharge Instruction: Wet-to-dry in  clinic ONLY. HH to change wound vac M W F ABD Pad, 5x9 Discharge Instruction: Apply over primary dressing as directed. Secured With 44M Medipore H Soft Cloth Surgical T 4 x 2 (in/yd) ape Discharge Instruction: Secure dressing with tape as directed. Compression Wrap Compression Stockings Add-Ons Electronic Signature(s) Signed: 06/09/2021 4:51:20 PM By: Zandra Abts RN, BSN Entered By: Zandra Abts on 06/09/2021  13:29:26 -------------------------------------------------------------------------------- Vitals Details Patient Name: Date of Service: [name] 06/09/2021 1:00 PM Medical Record Number: [id number] Patient Account Number: [id number] Date of Birth/Sex: Treating RN: 06-19-1981 (40 y.o. [name] Primary Care Sharifah Champine: Oliver Barre Other Clinician: Referring Camira Geidel: Treating Khadeem Rockett/Extender: Carley Hammed in Treatment: 10 Vital Signs Time Taken: 13:15 Temperature (F): 98.0 Height (in): 78 Pulse (bpm): 104 Weight (lbs): 170 Respiratory Rate (breaths/min): 16 Body Mass Index (BMI): 19.6 Blood Pressure (mmHg): 110/54 Reference Range: 80 - 120 mg / dl Electronic Signature(s) Signed: 06/09/2021 4:51:20 PM By: Zandra Abts RN, BSN Entered By: Zandra Abts on 06/09/2021 13:41:26

## 2021-06-11 ENCOUNTER — Telehealth: Payer: Self-pay | Admitting: Internal Medicine

## 2021-06-11 NOTE — Telephone Encounter (Signed)
Patient's mother informing provider [employer] faxed an authorization form on 06-11-2021 for patient to have necessities needed for his care

## 2021-06-23 ENCOUNTER — Telehealth: Payer: Self-pay

## 2021-06-23 NOTE — Telephone Encounter (Signed)
Should patient still be taking Bactrim. We received a refill request from the pharmacy. Please advise. John Figueroa T Pricilla Loveless

## 2021-07-02 ENCOUNTER — Encounter (HOSPITAL_BASED_OUTPATIENT_CLINIC_OR_DEPARTMENT_OTHER): Payer: Managed Care, Other (non HMO) | Attending: Internal Medicine | Admitting: Internal Medicine

## 2021-07-02 ENCOUNTER — Other Ambulatory Visit: Payer: Self-pay

## 2021-07-02 DIAGNOSIS — M8638 Chronic multifocal osteomyelitis, other site: Secondary | ICD-10-CM | POA: Diagnosis not present

## 2021-07-02 DIAGNOSIS — Z87440 Personal history of urinary (tract) infections: Secondary | ICD-10-CM | POA: Diagnosis not present

## 2021-07-02 DIAGNOSIS — D638 Anemia in other chronic diseases classified elsewhere: Secondary | ICD-10-CM | POA: Insufficient documentation

## 2021-07-02 DIAGNOSIS — T8131XA Disruption of external operation (surgical) wound, not elsewhere classified, initial encounter: Secondary | ICD-10-CM | POA: Diagnosis not present

## 2021-07-02 DIAGNOSIS — G8221 Paraplegia, complete: Secondary | ICD-10-CM | POA: Insufficient documentation

## 2021-07-02 DIAGNOSIS — L89324 Pressure ulcer of left buttock, stage 4: Secondary | ICD-10-CM | POA: Diagnosis present

## 2021-07-02 DIAGNOSIS — M726 Necrotizing fasciitis: Secondary | ICD-10-CM | POA: Diagnosis not present

## 2021-07-02 DIAGNOSIS — Y838 Other surgical procedures as the cause of abnormal reaction of the patient, or of later complication, without mention of misadventure at the time of the procedure: Secondary | ICD-10-CM | POA: Insufficient documentation

## 2021-07-02 NOTE — Telephone Encounter (Signed)
Patient checking status of authorization forms  Patient states [employer] has re-faxed the forms  *see below*
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
## 2021-07-03 NOTE — Telephone Encounter (Signed)
Paperwork never received. Spoke with representative from [employer] and provided two fax numbers from our office to fax paperwork to

## 2021-07-03 NOTE — Progress Notes (Signed)
DAIVIK, GRAPER ([id number]) Visit Report for 07/02/2021 Arrival Information Details Patient Name: Date of Service: John Figueroa [facility] [place]. 07/02/2021 1:00 PM Medical Record Number: [id number] Patient Account Number: [id number] Date of Birth/Sex: Treating RN: 1981/02/17 (41 y.o. John Figueroa, John Figueroa Primary Care Harlene Petralia: Cathlean Cower Other Clinician: Referring Laurin Paulo: Treating Steph Cheadle/Extender: [name] in Treatment: 14 Visit Information History Since Last Visit Added or deleted any medications: No Patient Arrived: Wheel Chair Any new allergies or adverse reactions: No Arrival Time: 13:09 Had a fall or experienced change in No Accompanied By: self activities of daily living that may affect Transfer Assistance: Manual risk of falls: Patient Requires Transmission-Based Precautions: No Signs or symptoms of abuse/neglect since last visito No Patient Has Alerts: No Hospitalized since last visit: No Implantable device outside of the clinic excluding No cellular tissue based products placed in the center since last visit: Has Dressing in Place as Prescribed: Yes Pain Present Now: No Electronic Signature(s) Signed: 07/03/2021 11:40:24 AM By: Rhae Hammock RN Entered By: Rhae Hammock on 07/02/2021 13:10:26 -------------------------------------------------------------------------------- Clinic Level of Care Assessment Details Patient Name: Date of Service: John Figueroa [facility] [place] M. 07/02/2021 1:00 PM Medical Record Number: [id number] Patient Account Number: [id number] Date of Birth/Sex: Treating RN: Dec 04, 1980 (41 y.o. John Figueroa, John Figueroa Primary Care Alfredo Collymore: Cathlean Cower Other Clinician: Referring John Figueroa: Treating John Figueroa/Extender: [name] in Treatment: 14 Clinic Level of Care Assessment Items TOOL 4 Quantity Score X- 1 0 Use when only an EandM is performed on FOLLOW-UP visit ASSESSMENTS - Nursing Assessment /  Reassessment X- 1 10 Reassessment of Co-morbidities (includes updates in patient status) X- 1 5 Reassessment of Adherence to Treatment Plan ASSESSMENTS - Wound and Skin A ssessment / Reassessment X - Simple Wound Assessment / Reassessment - one wound 1 5 []  - 0 Complex Wound Assessment / Reassessment - multiple wounds []  - 0 Dermatologic / Skin Assessment (not related to wound area) ASSESSMENTS - Focused Assessment []  - 0 Circumferential Edema Measurements - multi extremities []  - 0 Nutritional Assessment / Counseling / Intervention []  - 0 Lower Extremity Assessment (monofilament, tuning fork, pulses) []  - 0 Peripheral Arterial Disease Assessment (using hand held doppler) ASSESSMENTS - Ostomy and/or Continence Assessment and Care []  - 0 Incontinence Assessment and Management []  - 0 Ostomy Care Assessment and Management (repouching, etc.) PROCESS - Coordination of Care X - Simple Patient / Family Education for ongoing care 1 15 []  - 0 Complex (extensive) Patient / Family Education for ongoing care X- 1 10 Staff obtains Programmer, systems, Records, T Results / Process Orders est X- 1 10 Staff telephones HHA, Nursing Homes / Clarify orders / etc []  - 0 Routine Transfer to another Facility (non-emergent condition) []  - 0 Routine Hospital Admission (non-emergent condition) []  - 0 New Admissions / Biomedical engineer / Ordering NPWT Apligraf, etc. , []  - 0 Emergency Hospital Admission (emergent condition) X- 1 10 Simple Discharge Coordination []  - 0 Complex (extensive) Discharge Coordination PROCESS - Special Needs []  - 0 Pediatric / Minor Patient Management []  - 0 Isolation Patient Management []  - 0 Hearing / Language / Visual special needs []  - 0 Assessment of Community assistance (transportation, D/C planning, etc.) []  - 0 Additional assistance / Altered mentation []  - 0 Support Surface(s) Assessment (bed, cushion, seat, etc.) INTERVENTIONS - Wound Cleansing /  Measurement X - Simple Wound Cleansing - one wound 1 5 []  - 0 Complex Wound Cleansing - multiple wounds X- 1 5  Wound Imaging (photographs - any number of wounds) []  - 0 Wound Tracing (instead of photographs) X- 1 5 Simple Wound Measurement - one wound []  - 0 Complex Wound Measurement - multiple wounds INTERVENTIONS - Wound Dressings X - Small Wound Dressing one or multiple wounds 1 10 []  - 0 Medium Wound Dressing one or multiple wounds []  - 0 Large Wound Dressing one or multiple wounds []  - 0 Application of Medications - topical []  - 0 Application of Medications - injection INTERVENTIONS - Miscellaneous []  - 0 External ear exam []  - 0 Specimen Collection (cultures, biopsies, blood, body fluids, etc.) []  - 0 Specimen(s) / Culture(s) sent or taken to Lab for analysis []  - 0 Patient Transfer (multiple staff / Civil Service fast streamer / Similar devices) []  - 0 Simple Staple / Suture removal (25 or less) []  - 0 Complex Staple / Suture removal (26 or more) []  - 0 Hypo / Hyperglycemic Management (close monitor of Blood Glucose) []  - 0 Ankle / Brachial Index (ABI) - do not check if billed separately X- 1 5 Vital Signs Has the patient been seen at the hospital within the last three years: Yes Total Score: 95 Level Of Care: New/Established - Level 3 Electronic Signature(s) Signed: 07/03/2021 11:40:24 AM By: Rhae Hammock RN Entered By: Rhae Hammock on 07/02/2021 13:45:08 -------------------------------------------------------------------------------- Encounter Discharge Information Details Patient Name: Date of Service: John Kathe Mariner, John M. 07/02/2021 1:00 PM Medical Record Number: [id number] Patient Account Number: [id number] Date of Birth/Sex: Treating RN: 1981/06/16 (41 y.o. John Figueroa, John Figueroa Primary Care Delmi Fulfer: Cathlean Cower Other Clinician: Referring Amor Hyle: Treating Tawanda Schall/Extender: [name] in Treatment: 14 Encounter Discharge Information  Items Discharge Condition: Stable Ambulatory Status: Wheelchair Discharge Destination: Home Transportation: Private Auto Accompanied By: self Schedule Follow-up Appointment: Yes Clinical Summary of Care: Patient Declined Electronic Signature(s) Signed: 07/03/2021 11:40:24 AM By: Rhae Hammock RN Entered By: Rhae Hammock on 07/02/2021 13:46:20 -------------------------------------------------------------------------------- Lower Extremity Assessment Details Patient Name: Date of Service: John Figueroa [facility] [place] M. 07/02/2021 1:00 PM Medical Record Number: [id number] Patient Account Number: [id number] Date of Birth/Sex: Treating RN: 1980-07-01 (41 y.o. Erie Noe Primary Care Aisia Correira: Cathlean Cower Other Clinician: Referring Spirit Wernli: Treating Rosalind Guido/Extender: [name] in Treatment: 14 Electronic Signature(s) Signed: 07/03/2021 11:40:24 AM By: Rhae Hammock RN Entered By: Rhae Hammock on 07/02/2021 13:12:52 -------------------------------------------------------------------------------- Multi Wound Chart Details Patient Name: Date of Service: John Figueroa [facility] [place] M. 07/02/2021 1:00 PM Medical Record Number: [id number] Patient Account Number: [id number] Date of Birth/Sex: Treating RN: May 14, 1981 (41 y.o. Hessie Diener Primary Care Jamail Cullers: Cathlean Cower Other Clinician: Referring Acel Natzke: Treating Maitlyn Penza/Extender: [name] in Treatment: 14 Vital Signs Height(in): 78 Pulse(bpm): 108 Weight(lbs): 170 Blood Pressure(mmHg): 113/74 Body Mass Index(BMI): 20 Temperature(F): 98 Respiratory Rate(breaths/min): 17 Photos: [N/A:N/A] Left Ischium N/A N/A Wound Location: Pressure Injury N/A N/A Wounding Event: Pressure Ulcer N/A N/A Primary Etiology: Tobacco Use, Paraplegia N/A N/A Comorbid History: 06/28/2020 N/A N/A Date Acquired: 14 N/A N/A Weeks of Treatment: Open N/A N/A Wound Status: 6.4x7.5x8 N/A  N/A Measurements L x W x D (cm) 37.699 N/A N/A A (cm) : rea 301.593 N/A N/A Volume (cm) : 42.00% N/A N/A % Reduction in A rea: 28.70% N/A N/A % Reduction in Volume: 12 Starting Position 1 (o'clock): 12 Ending Position 1 (o'clock): 4 Maximum Distance 1 (cm): Yes N/A N/A Undermining: Category/Stage IV N/A N/A Classification: Large N/A N/A Exudate A mount: Serosanguineous N/A N/A Exudate  Type: red, brown N/A N/A Exudate Color: Distinct, outline attached N/A N/A Wound Margin: Large (67-100%) N/A N/A Granulation A mount: Red, Pink N/A N/A Granulation Quality: None Present (0%) N/A N/A Necrotic A mount: Fat Layer (Subcutaneous Tissue): Yes N/A N/A Exposed Structures: Muscle: Yes Fascia: No Tendon: No Joint: No Bone: No None N/A N/A Epithelialization: Treatment Notes Electronic Signature(s) Signed: 07/02/2021 4:38:12 PM By: Linton Ham MD Signed: 07/02/2021 5:33:22 PM By: Deon Pilling RN, BSN Entered By: Linton Ham on 07/02/2021 13:33:13 -------------------------------------------------------------------------------- Multi-Disciplinary Care Plan Details Patient Name: Date of Service: John Kathe Mariner, A [facility] [place] M. 07/02/2021 1:00 PM Medical Record Number: [id number] Patient Account Number: [id number] Date of Birth/Sex: Treating RN: [birth year] (41 y.o. John Figueroa, John Figueroa Primary Care Angelito Hopping: Cathlean Cower Other Clinician: Referring Randee Upchurch: Treating Steffany Schoenfelder/Extender: [name] in Treatment: 14 Active Inactive Wound/Skin Impairment Nursing Diagnoses: Impaired tissue integrity Knowledge deficit related to ulceration/compromised skin integrity Goals: Patient/caregiver will verbalize understanding of skin care regimen Date Initiated: 03/25/2021 Target Resolution Date: 07/10/2021 Goal Status: Active Interventions: Assess patient/caregiver ability to obtain necessary supplies Notes: Electronic Signature(s) Signed: 07/03/2021 11:40:24  AM By: Rhae Hammock RN Entered By: Rhae Hammock on 07/02/2021 13:44:03 -------------------------------------------------------------------------------- Pain Assessment Details Patient Name: Date of Service: John Figueroa [facility] [place] M. 07/02/2021 1:00 PM Medical Record Number: [id number] Patient Account Number: [id number] Date of Birth/Sex: Treating RN: 1980-09-07 (41 y.o. John Figueroa, John Figueroa Primary Care Dujuan Stankowski: Cathlean Cower Other Clinician: Referring Bain Whichard: Treating Finley Chevez/Extender: [name] in Treatment: 14 Active Problems Location of Pain Severity and Description of Pain Patient Has Paino No Site Locations Pain Management and Medication Current Pain Management: Electronic Signature(s) Signed: 07/03/2021 11:40:24 AM By: Rhae Hammock RN Entered By: Rhae Hammock on 07/02/2021 13:11:38 -------------------------------------------------------------------------------- Patient/Caregiver Education Details Patient Name: Date of Service: John Lujean Rave. 1/5/2023andnbsp1:00 PM Medical Record Number: [id number] Patient Account Number: [id number] Date of Birth/Gender: Treating RN: [birth year] (41 y.o. Erie Noe Primary Care Physician: Cathlean Cower Other Clinician: Referring Physician: Treating Physician/Extender: [name] in Treatment: 14 Education Assessment Education Provided To: Patient Education Topics Provided Wound/Skin Impairment: Methods: Explain/Verbal Responses: State content correctly [employer]) Signed: 07/03/2021 11:40:24 AM By: Rhae Hammock RN Entered By: Rhae Hammock on 07/02/2021 13:44:18 -------------------------------------------------------------------------------- Wound Assessment Details Patient Name: Date of Service: John O RE, [place]. 07/02/2021 1:00 PM Medical Record Number: [id number] Patient Account Number: [id number] Date of Birth/Sex: Treating  RN: [birth year] (41 y.o. John Figueroa, John Figueroa Primary Care Donzell Coller: Cathlean Cower Other Clinician: Referring Davyn Elsasser: Treating Angus Amini/Extender: [name] in Treatment: 14 Wound Status Wound Number: 7 Primary Etiology: Pressure Ulcer Wound Location: Left Ischium Wound Status: Open Wounding Event: Pressure Injury Comorbid History: Tobacco Use, Paraplegia Date Acquired: 06/28/2020 Weeks Of Treatment: 14 Clustered Wound: No Photos Wound Measurements Length: (cm) 6.4 Width: (cm) 7.5 Depth: (cm) 8 Area: (cm) 37.699 Volume: (cm) 301.593 % Reduction in Area: 42% % Reduction in Volume: 28.7% Epithelialization: None Tunneling: No Undermining: Yes Starting Position (o'clock): 12 Ending Position (o'clock): 12 Maximum Distance: (cm) 4 Wound Description Classification: Category/Stage IV Wound Margin: Distinct, outline attached Exudate Amount: Large Exudate Type: Serosanguineous Exudate Color: red, brown Foul Odor After Cleansing: No Slough/Fibrino Yes Wound Bed Granulation Amount: Large (67-100%) Exposed Structure Granulation Quality: Red, Pink Fascia Exposed: No Necrotic Amount: None Present (0%) Fat Layer (Subcutaneous Tissue) Exposed: Yes Tendon Exposed: No Muscle Exposed: Yes Necrosis of Muscle: No Joint Exposed: No Bone Exposed:  No Treatment Notes Wound #7 (Ischium) Wound Laterality: Left Cleanser Normal Saline Discharge Instruction: Cleanse the wound with Normal Saline prior to applying a clean dressing using gauze sponges, not tissue or cotton balls. Soap and Water Discharge Instruction: May shower and wash wound with dial antibacterial soap and water prior to dressing change. Peri-Wound Care Topical Primary Dressing Wet-to-dry today in clinic Discharge Instruction: [place] to apply wound vac on next visit Secondary Dressing Woven Gauze Sponge, Non-Sterile 4x4 in Discharge Instruction: Wet-to-dry in clinic ONLY. HH to change wound vac M W  F ABD Pad, 5x9 Discharge Instruction: Apply over primary dressing as directed. Secured With 70M Medipore H Soft Cloth Surgical T 4 x 2 (in/yd) ape Discharge Instruction: Secure dressing with tape as directed. Compression Wrap Compression Stockings Add-Ons Electronic Signature(s) Signed: 07/03/2021 11:40:24 AM By: Rhae Hammock RN Entered By: Rhae Hammock on 07/02/2021 13:19:33 -------------------------------------------------------------------------------- Vitals Details Patient Name: Date of Service: John Kathe Mariner, [place]. 07/02/2021 1:00 PM Medical Record Number: [id number] Patient Account Number: [id number] Date of Birth/Sex: Treating RN: 1981-01-04 (41 y.o. John Figueroa, John Figueroa Primary Care Charlsey Moragne: Cathlean Cower Other Clinician: Referring Anupama Piehl: Treating Alajiah Dutkiewicz/Extender: [name] in Treatment: 14 Vital Signs Time Taken: 13:10 Temperature (F): 98 Height (in): 78 Pulse (bpm): 108 Weight (lbs): 170 Respiratory Rate (breaths/min): 17 Body Mass Index (BMI): 19.6 Blood Pressure (mmHg): 113/74 Reference Range: 80 - 120 mg / dl Electronic Signature(s) Signed: 07/03/2021 11:40:24 AM By: Rhae Hammock RN Entered By: Rhae Hammock on 07/02/2021 13:11:10

## 2021-07-06 NOTE — Telephone Encounter (Signed)
Paperwork was received 07/03/21. Filled out and faxed. Patient notified.

## 2021-07-16 ENCOUNTER — Encounter (HOSPITAL_BASED_OUTPATIENT_CLINIC_OR_DEPARTMENT_OTHER): Payer: Managed Care, Other (non HMO) | Admitting: Internal Medicine

## 2021-07-16 ENCOUNTER — Other Ambulatory Visit: Payer: Self-pay

## 2021-07-16 DIAGNOSIS — L89324 Pressure ulcer of left buttock, stage 4: Secondary | ICD-10-CM | POA: Diagnosis not present

## 2021-07-16 NOTE — Progress Notes (Signed)
BURNES, SOULES ([id number]) Visit Report for 07/16/2021 Arrival Information Details Patient Name: Date of Service: [name] [facility] [place]. 07/16/2021 1:00 PM Medical Record Number: [id number] Patient Account Number: [id number] Date of Birth/Sex: Treating RN: March 04, 1981 (41 y.o. Hessie Diener Primary Care Orel Hord: Cathlean Cower Other Clinician: Referring Addilyne Backs: Treating Vraj Denardo/Extender: [name] in Treatment: 66 Visit Information History Since Last Visit Added or deleted any medications: No Patient Arrived: Wheel Chair Any new allergies or adverse reactions: No Arrival Time: 13:15 Had a fall or experienced change in No Accompanied By: self activities of daily living that may affect Transfer Assistance: Manual risk of falls: Patient Identification Verified: Yes Signs or symptoms of abuse/neglect since last visito No Secondary Verification Process Completed: Yes Hospitalized since last visit: No Patient Requires Transmission-Based Precautions: No Implantable device outside of the clinic excluding No Patient Has Alerts: No cellular tissue based products placed in the center since last visit: Has Dressing in Place as Prescribed: Yes Pain Present Now: No Electronic Signature(s) Signed: 07/16/2021 5:47:38 PM By: Deon Pilling RN, BSN Entered By: Deon Pilling on 07/16/2021 13:26:50 -------------------------------------------------------------------------------- Clinic Level of Care Assessment Details Patient Name: Date of Service: [name] [facility] [place] M. 07/16/2021 1:00 PM Medical Record Number: [id number] Patient Account Number: [id number] Date of Birth/Sex: Treating RN: [birth year] (41 y.o. Lorette Ang, Meta.Reding Primary Care Mariah Gerstenberger: Cathlean Cower Other Clinician: Referring Sevastian Witczak: Treating Tinesha Siegrist/Extender: [name] in Treatment: 16 Clinic Level of Care Assessment Items TOOL 4 Quantity Score X- 1 0 Use when only an EandM is  performed on FOLLOW-UP visit ASSESSMENTS - Nursing Assessment / Reassessment X- 1 10 Reassessment of Co-morbidities (includes updates in patient status) X- 1 5 Reassessment of Adherence to Treatment Plan ASSESSMENTS - Wound and Skin A ssessment / Reassessment []  - 0 Simple Wound Assessment / Reassessment - one wound X- 1 5 Complex Wound Assessment / Reassessment - multiple wounds X- 1 10 Dermatologic / Skin Assessment (not related to wound area) ASSESSMENTS - Focused Assessment []  - 0 Circumferential Edema Measurements - multi extremities X- 1 10 Nutritional Assessment / Counseling / Intervention []  - 0 Lower Extremity Assessment (monofilament, tuning fork, pulses) []  - 0 Peripheral Arterial Disease Assessment (using hand held doppler) ASSESSMENTS - Ostomy and/or Continence Assessment and Care []  - 0 Incontinence Assessment and Management []  - 0 Ostomy Care Assessment and Management (repouching, etc.) PROCESS - Coordination of Care []  - 0 Simple Patient / Family Education for ongoing care X- 1 20 Complex (extensive) Patient / Family Education for ongoing care X- 1 10 Staff obtains Consents, Records, T Results / Process Orders est X- 1 10 Staff telephones HHA, Nursing Homes / Clarify orders / etc []  - 0 Routine Transfer to another Facility (non-emergent condition) []  - 0 Routine Hospital Admission (non-emergent condition) []  - 0 New Admissions / Biomedical engineer / Ordering NPWT Apligraf, etc. , []  - 0 Emergency Hospital Admission (emergent condition) []  - 0 Simple Discharge Coordination X- 1 15 Complex (extensive) Discharge Coordination PROCESS - Special Needs []  - 0 Pediatric / Minor Patient Management []  - 0 Isolation Patient Management []  - 0 Hearing / Language / Visual special needs []  - 0 Assessment of Community assistance (transportation, D/C planning, etc.) []  - 0 Additional assistance / Altered mentation []  - 0 Support Surface(s) Assessment  (bed, cushion, seat, etc.) INTERVENTIONS - Wound Cleansing / Measurement []  - 0 Simple Wound Cleansing - one wound X- 1 5 Complex Wound  Cleansing - multiple wounds X- 1 5 Wound Imaging (photographs - any number of wounds) []  - 0 Wound Tracing (instead of photographs) []  - 0 Simple Wound Measurement - one wound X- 1 5 Complex Wound Measurement - multiple wounds INTERVENTIONS - Wound Dressings []  - 0 Small Wound Dressing one or multiple wounds X- 1 15 Medium Wound Dressing one or multiple wounds []  - 0 Large Wound Dressing one or multiple wounds []  - 0 Application of Medications - topical []  - 0 Application of Medications - injection INTERVENTIONS - Miscellaneous []  - 0 External ear exam []  - 0 Specimen Collection (cultures, biopsies, blood, body fluids, etc.) []  - 0 Specimen(s) / Culture(s) sent or taken to Lab for analysis []  - 0 Patient Transfer (multiple staff / Civil Service fast streamer / Similar devices) []  - 0 Simple Staple / Suture removal (25 or less) []  - 0 Complex Staple / Suture removal (26 or more) []  - 0 Hypo / Hyperglycemic Management (close monitor of Blood Glucose) []  - 0 Ankle / Brachial Index (ABI) - do not check if billed separately X- 1 5 Vital Signs Has the patient been seen at the hospital within the last three years: Yes Total Score: 130 Level Of Care: New/Established - Level 4 Electronic Signature(s) Signed: 07/16/2021 5:47:38 PM By: Deon Pilling RN, BSN Entered By: Deon Pilling on 07/16/2021 13:54:39 -------------------------------------------------------------------------------- Encounter Discharge Information Details Patient Name: Date of Service: [name], [facility] [place] M. 07/16/2021 1:00 PM Medical Record Number: [id number] Patient Account Number: [id number] Date of Birth/Sex: Treating RN: [birth year] (41 y.o. Hessie Diener Primary Care Kashvi Prevette: Cathlean Cower Other Clinician: Referring Atsushi Yom: Treating Javarius Tsosie/Extender: [name] in Treatment: 16 Encounter Discharge Information Items Discharge Condition: Stable Ambulatory Status: Wheelchair Discharge Destination: Home Transportation: Private Auto Accompanied By: self Schedule Follow-up Appointment: Yes Clinical Summary of Care: Electronic Signature(s) Signed: 07/16/2021 5:47:38 PM By: Deon Pilling RN, BSN Entered By: Deon Pilling on 07/16/2021 13:55:21 -------------------------------------------------------------------------------- Lower Extremity Assessment Details Patient Name: Date of Service: [name] [facility] [place] M. 07/16/2021 1:00 PM Medical Record Number: [id number] Patient Account Number: [id number] Date of Birth/Sex: Treating RN: 06/27/1981 (41 y.o. Hessie Diener Primary Care Cailee Blanke: Cathlean Cower Other Clinician: Referring Deari Sessler: Treating Hind Chesler/Extender: [name] in Treatment: 16 Electronic Signature(s) Signed: 07/16/2021 5:47:38 PM By: Deon Pilling RN, BSN Entered By: Deon Pilling on 07/16/2021 13:27:31 -------------------------------------------------------------------------------- Multi Wound Chart Details Patient Name: Date of Service: [name] [facility] [place] M. 07/16/2021 1:00 PM Medical Record Number: [id number] Patient Account Number: [id number] Date of Birth/Sex: Treating RN: 1980-12-23 (41 y.o. Hessie Diener Primary Care Darris Staiger: Cathlean Cower Other Clinician: Referring Domingos Riggi: Treating Mckenzey Parcell/Extender: [name] in Treatment: 16 Vital Signs Height(in): 78 Pulse(bpm): 96 Weight(lbs): 170 Blood Pressure(mmHg): 143/77 Body Mass Index(BMI): 20 Temperature(F): 98.6 Respiratory Rate(breaths/min): 20 Photos: [N/A:N/A] Left Ischium N/A N/A Wound Location: Pressure Injury N/A N/A Wounding Event: Pressure Ulcer N/A N/A Primary Etiology: Tobacco Use, Paraplegia N/A N/A Comorbid History: 06/28/2020 N/A N/A Date Acquired: 16 N/A N/A Weeks of Treatment: Open  N/A N/A Wound Status: 5.7x7x6.4 N/A N/A Measurements L x W x D (cm) 31.337 N/A N/A A (cm) : rea 200.559 N/A N/A Volume (cm) : 51.80% N/A N/A % Reduction in A rea: 52.60% N/A N/A % Reduction in Volume: 12 Starting Position 1 (o'clock): 12 Ending Position 1 (o'clock): 6.5 Maximum Distance 1 (cm): Yes N/A N/A Undermining: Category/Stage IV N/A N/A Classification: Large N/A N/A  Exudate A mount: Serosanguineous N/A N/A Exudate Type: red, brown N/A N/A Exudate Color: Distinct, outline attached N/A N/A Wound Margin: Large (67-100%) N/A N/A Granulation A mount: Red, Pink N/A N/A Granulation Quality: Small (1-33%) N/A N/A Necrotic A mount: Fat Layer (Subcutaneous Tissue): Yes N/A N/A Exposed Structures: Muscle: Yes Bone: Yes Fascia: No Tendon: No Joint: No None N/A N/A Epithelialization: Treatment Notes Wound #7 (Ischium) Wound Laterality: Left Cleanser Normal Saline Discharge Instruction: Cleanse the wound with Normal Saline prior to applying a clean dressing using gauze sponges, not tissue or cotton balls. Soap and Water Discharge Instruction: May shower and wash wound with dial antibacterial soap and water prior to dressing change. Peri-Wound Care Topical Primary Dressing Promogran Prisma Matrix, 4.34 (sq in) (silver collagen) Discharge Instruction: Moisten collagen with saline or hydrogel directly to wound bed. Wet-to-dry today in clinic Discharge Instruction: HH to apply prisma and wound vac on next visit Secondary Dressing Woven Gauze Sponge, Non-Sterile 4x4 in Discharge Instruction: Wet-to-dry in clinic ONLY. HH to change wound vac M W F ABD Pad, 5x9 Discharge Instruction: Apply over primary dressing as directed. Secured With 47M Medipore H Soft Cloth Surgical T 4 x 2 (in/yd) ape Discharge Instruction: Secure dressing with tape as directed. Compression Wrap Compression Stockings Add-Ons Electronic Signature(s) Signed: 07/16/2021 4:44:32 PM By:  Baltazar Najjar MD Signed: 07/16/2021 5:47:38 PM By: Shawn Stall RN, BSN Entered By: Baltazar Najjar on 07/16/2021 13:57:49 -------------------------------------------------------------------------------- Multi-Disciplinary Care Plan Details Patient Name: Date of Service: John Figueroa RE, A [facility] [place] M. 07/16/2021 1:00 PM Medical Record Number: [id number] Patient Account Number: [id number] Date of Birth/Sex: Treating RN: 1980-09-07 (41 y.o. Tammy Sours Primary Care Kada Friesen: Oliver Barre Other Clinician: Referring Leina Babe: Treating Eligio Angert/Extender: Carley Hammed in Treatment: 16 Active Inactive Wound/Skin Impairment Nursing Diagnoses: Impaired tissue integrity Knowledge deficit related to ulceration/compromised skin integrity Goals: Patient/caregiver will verbalize understanding of skin care regimen Date Initiated: 03/25/2021 Target Resolution Date: 08/28/2021 Goal Status: Active Interventions: Assess patient/caregiver ability to obtain necessary supplies Notes: Electronic Signature(s) Signed: 07/16/2021 5:47:38 PM By: Shawn Stall RN, BSN Entered By: Shawn Stall on 07/16/2021 13:49:35 -------------------------------------------------------------------------------- Pain Assessment Details Patient Name: Date of Service: [name] [facility] [place] M. 07/16/2021 1:00 PM Medical Record Number: [id number] Patient Account Number: [id number] Date of Birth/Sex: Treating RN: April 05, 1981 (41 y.o. Tammy Sours Primary Care Xylia Scherger: Oliver Barre Other Clinician: Referring Christopher Glasscock: Treating Tansy Lorek/Extender: Carley Hammed in Treatment: 16 Active Problems Location of Pain Severity and Description of Pain Patient Has Paino No Site Locations Rate the pain. Current Pain Level: 0 Pain Management and Medication Current Pain Management: Medication: No Cold Application: No Rest: No Massage: No Activity: No T.E.N.S.: No Heat Application: No Leg  drop or elevation: No Is the Current Pain Management Adequate: Adequate How does your wound impact your activities of daily livingo Sleep: No Bathing: No Appetite: No Relationship With Others: No Bladder Continence: No Emotions: No Bowel Continence: No Work: No Toileting: No Drive: No Dressing: No Hobbies: No Psychologist, prison and probation services) Signed: 07/16/2021 5:47:38 PM By: Shawn Stall RN, BSN Entered By: Shawn Stall on 07/16/2021 13:27:24 -------------------------------------------------------------------------------- Patient/Caregiver Education Details Patient Name: Date of Service: MO John Ridgel. 1/19/2023andnbsp1:00 PM Medical Record Number: [id number] Patient Account Number: [id number] Date of Birth/Gender: Treating RN: [birth year] (41 y.o. Tammy Sours Primary Care Physician: Oliver Barre Other Clinician: Referring Physician: Treating Physician/Extender: Carley Hammed in Treatment: 16 Education Assessment Education Provided To: Patient Education  Topics Provided Wound/Skin Impairment: Handouts: Skin Care Do's and Dont's Methods: Explain/Verbal Responses: Reinforcements needed Electronic Signature(s) Signed: 07/16/2021 5:47:38 PM By: Deon Pilling RN, BSN Entered By: Deon Pilling on 07/16/2021 13:49:49 -------------------------------------------------------------------------------- Wound Assessment Details Patient Name: Date of Service: [name] [facility] [place] M. 07/16/2021 1:00 PM Medical Record Number: [id number] Patient Account Number: [id number] Date of Birth/Sex: Treating RN: [birth year] (41 y.o. Lorette Ang, Meta.Reding Primary Care Joud Pettinato: Cathlean Cower Other Clinician: Referring Kaven Cumbie: Treating Raynisha Avilla/Extender: [name] in Treatment: 16 Wound Status Wound Number: 7 Primary Etiology: Pressure Ulcer Wound Location: Left Ischium Wound Status: Open Wounding Event: Pressure Injury Comorbid History: Tobacco Use,  Paraplegia Date Acquired: 06/28/2020 Weeks Of Treatment: 16 Clustered Wound: No Photos Wound Measurements Length: (cm) 5.7 Width: (cm) 7 Depth: (cm) 6.4 Area: (cm) 31.337 Volume: (cm) 200.559 % Reduction in Area: 51.8% % Reduction in Volume: 52.6% Epithelialization: None Tunneling: No Undermining: Yes Starting Position (o'clock): 12 Ending Position (o'clock): 12 Maximum Distance: (cm) 6.5 Wound Description Classification: Category/Stage IV Wound Margin: Distinct, outline attached Exudate Amount: Large Exudate Type: Serosanguineous Exudate Color: red, brown Foul Odor After Cleansing: No Slough/Fibrino Yes Wound Bed Granulation Amount: Large (67-100%) Exposed Structure Granulation Quality: Red, Pink Fascia Exposed: No Necrotic Amount: Small (1-33%) Fat Layer (Subcutaneous Tissue) Exposed: Yes Necrotic Quality: Adherent Slough Tendon Exposed: No Muscle Exposed: Yes Necrosis of Muscle: No Joint Exposed: No Bone Exposed: Yes Treatment Notes Wound #7 (Ischium) Wound Laterality: Left Cleanser Normal Saline Discharge Instruction: Cleanse the wound with Normal Saline prior to applying a clean dressing using gauze sponges, not tissue or cotton balls. Soap and Water Discharge Instruction: May shower and wash wound with dial antibacterial soap and water prior to dressing change. Peri-Wound Care Topical Primary Dressing Promogran Prisma Matrix, 4.34 (sq in) (silver collagen) Discharge Instruction: Moisten collagen with saline or hydrogel directly to wound bed. Wet-to-dry today in clinic Discharge Instruction: [place] to apply prisma and wound vac on next visit Secondary Dressing Woven Gauze Sponge, Non-Sterile 4x4 in Discharge Instruction: Wet-to-dry in clinic ONLY. HH to change wound vac M W F ABD Pad, 5x9 Discharge Instruction: Apply over primary dressing as directed. Secured With 40M Medipore H Soft Cloth Surgical T 4 x 2 (in/yd) ape Discharge Instruction: Secure dressing  with tape as directed. Compression Wrap Compression Stockings Add-Ons Electronic Signature(s) Signed: 07/16/2021 5:36:48 PM By: Baruch Gouty RN, BSN Signed: 07/16/2021 5:47:38 PM By: Deon Pilling RN, BSN Entered By: Baruch Gouty on 07/16/2021 13:30:44 -------------------------------------------------------------------------------- Vitals Details Patient Name: Date of Service: [name], [place] M. 07/16/2021 1:00 PM Medical Record Number: [id number] Patient Account Number: [id number] Date of Birth/Sex: Treating RN: 1980/07/17 (41 y.o. Lorette Ang, Meta.Reding Primary Care Melik Blancett: Cathlean Cower Other Clinician: Referring Odaliz Mcqueary: Treating Levis Nazir/Extender: [name] in Treatment: 16 Vital Signs Time Taken: 13:15 Temperature (F): 98.6 Height (in): 78 Pulse (bpm): 96 Weight (lbs): 170 Respiratory Rate (breaths/min): 20 Body Mass Index (BMI): 19.6 Blood Pressure (mmHg): 143/77 Reference Range: 80 - 120 mg / dl Electronic Signature(s) Signed: 07/16/2021 5:47:38 PM By: Deon Pilling RN, BSN Entered By: Deon Pilling on 07/16/2021 13:27:13
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
## 2021-07-20 ENCOUNTER — Telehealth: Payer: Self-pay | Admitting: Internal Medicine

## 2021-07-20 NOTE — Telephone Encounter (Signed)
1.Medication Requested: ALPRAZolam (XANAX) 1 MG tablet  2. Pharmacy (Name, Street, [place]): [facility] [phone] - [place], [place] - [address] AT [facility] [address] & [facility]  Phone:  [phone] Fax:  [phone]   3. On Med List: yes  4. Last Visit with PCP: 11.09.22  5. Next visit date with PCP: n/a   Agent: Please be advised that RX refills may take up to 3 business days. We ask that you follow-up with your pharmacy.

## 2021-07-22 MED ORDER — ALPRAZOLAM 1 MG PO TABS: 1.0000 mg | ORAL_TABLET | Freq: Two times a day (BID) | ORAL | 2 refills | Status: AC | PRN

## 2021-07-31 ENCOUNTER — Telehealth: Payer: Self-pay | Admitting: Internal Medicine

## 2021-07-31 NOTE — Telephone Encounter (Signed)
Left message for patient to call me back. 

## 2021-07-31 NOTE — Telephone Encounter (Signed)
John Figueroa w/ [facility] requesting a call back regarding request forms that was faxed on 07-26-2021 for a light weight wheelchair  Phone [phone]

## 2021-08-03 ENCOUNTER — Telehealth: Payer: Self-pay | Admitting: Internal Medicine

## 2021-08-03 MED ORDER — TRAMADOL HCL 50 MG PO TABS: 50.0000 mg | ORAL_TABLET | Freq: Four times a day (QID) | ORAL | 0 refills | Status: AC | PRN

## 2021-08-03 NOTE — Telephone Encounter (Signed)
[place] w/ [facility] informing provider pt has been experiencing mild back pain x2w, caller states she visited pt today, pt complained about increased back pain over the weekend  Inquired if pt needed an appt, caller stated pt states he does not need an appt, pt states provider prescribed pain medication in the past but does not know the name of it, caller states medication is not on pt's current med list  Advised caller pt will need to be schedule an ov for new rx, caller stated she will call and advise pt

## 2021-08-03 NOTE — Telephone Encounter (Signed)
Ok for tramadol prn - done erx 

## 2021-08-04 NOTE — Telephone Encounter (Signed)
Notified patient of prescription.

## 2021-08-06 ENCOUNTER — Encounter (HOSPITAL_BASED_OUTPATIENT_CLINIC_OR_DEPARTMENT_OTHER): Payer: Managed Care, Other (non HMO) | Attending: Internal Medicine | Admitting: Internal Medicine

## 2021-08-06 ENCOUNTER — Other Ambulatory Visit (HOSPITAL_BASED_OUTPATIENT_CLINIC_OR_DEPARTMENT_OTHER): Payer: Self-pay | Admitting: Internal Medicine

## 2021-08-06 ENCOUNTER — Other Ambulatory Visit: Payer: Self-pay

## 2021-08-06 DIAGNOSIS — M726 Necrotizing fasciitis: Secondary | ICD-10-CM | POA: Diagnosis not present

## 2021-08-06 DIAGNOSIS — M8638 Chronic multifocal osteomyelitis, other site: Secondary | ICD-10-CM | POA: Insufficient documentation

## 2021-08-06 DIAGNOSIS — I10 Essential (primary) hypertension: Secondary | ICD-10-CM | POA: Diagnosis not present

## 2021-08-06 DIAGNOSIS — D638 Anemia in other chronic diseases classified elsewhere: Secondary | ICD-10-CM | POA: Insufficient documentation

## 2021-08-06 DIAGNOSIS — B9562 Methicillin resistant Staphylococcus aureus infection as the cause of diseases classified elsewhere: Secondary | ICD-10-CM | POA: Insufficient documentation

## 2021-08-06 DIAGNOSIS — Z87440 Personal history of urinary (tract) infections: Secondary | ICD-10-CM | POA: Diagnosis not present

## 2021-08-06 DIAGNOSIS — L89324 Pressure ulcer of left buttock, stage 4: Secondary | ICD-10-CM | POA: Diagnosis not present

## 2021-08-06 DIAGNOSIS — G8221 Paraplegia, complete: Secondary | ICD-10-CM | POA: Insufficient documentation

## 2021-08-06 DIAGNOSIS — Z8619 Personal history of other infectious and parasitic diseases: Secondary | ICD-10-CM | POA: Diagnosis not present

## 2021-08-06 DIAGNOSIS — T8131XD Disruption of external operation (surgical) wound, not elsewhere classified, subsequent encounter: Secondary | ICD-10-CM | POA: Insufficient documentation

## 2021-08-06 DIAGNOSIS — X58XXXD Exposure to other specified factors, subsequent encounter: Secondary | ICD-10-CM | POA: Insufficient documentation

## 2021-08-06 NOTE — Progress Notes (Signed)
INNIS, CAMIRE ([id number]) Visit Report for 08/06/2021 Arrival Information Details Patient Name: Date of Service: [name] [facility] [place]. 08/06/2021 1:00 PM Medical Record Number: [id number] Patient Account Number: [id number] Date of Birth/Sex: Treating RN: [birth year] (41 y.o. Collene Gobble Primary Care Adalind Weitz: Cathlean Cower Other Clinician: Referring Dhalia Zingaro: Treating Alene Bergerson/Extender: [name] in Treatment: 65 Visit Information History Since Last Visit Added or deleted any medications: No Patient Arrived: Wheel Chair Any new allergies or adverse reactions: No Arrival Time: 13:14 Had a fall or experienced change in No Accompanied By: self activities of daily living that may affect Transfer Assistance: None risk of falls: Patient Requires Transmission-Based Precautions: No Signs or symptoms of abuse/neglect since last visito No Patient Has Alerts: No Hospitalized since last visit: No Implantable device outside of the clinic excluding No cellular tissue based products placed in the center since last visit: Has Dressing in Place as Prescribed: Yes Pain Present Now: No Electronic Signature(s) Signed: 08/06/2021 5:44:59 PM By: Dellie Catholic RN Entered By: Dellie Catholic on 08/06/2021 13:15:25 -------------------------------------------------------------------------------- Encounter Discharge Information Details Patient Name: Date of Service: [place] Kathe Mariner, A [place]. 08/06/2021 1:00 PM Medical Record Number: [id number] Patient Account Number: [id number] Date of Birth/Sex: Treating RN: September 09, 1980 (41 y.o. Collene Gobble Primary Care Isadore Bokhari: Cathlean Cower Other Clinician: Referring Beryle Zeitz: Treating Sharis Keeran/Extender: [name] in Treatment: 19 Encounter Discharge Information Items Post Procedure Vitals Discharge Condition: Stable Temperature (F): 97.7 Ambulatory Status: Wheelchair Pulse (bpm): 93 Discharge Destination:  Home Respiratory Rate (breaths/min): 18 Transportation: Private Auto Blood Pressure (mmHg): 129/68 Accompanied By: self Schedule Follow-up Appointment: Yes Clinical Summary of Care: Patient Declined Electronic Signature(s) Signed: 08/06/2021 5:44:59 PM By: Dellie Catholic RN Entered By: Dellie Catholic on 08/06/2021 17:44:30 -------------------------------------------------------------------------------- Lower Extremity Assessment Details Patient Name: Date of Service: [name] [facility] [place]. 08/06/2021 1:00 PM Medical Record Number: [id number] Patient Account Number: [id number] Date of Birth/Sex: Treating RN: Jun 24, 1981 (41 y.o. Collene Gobble Primary Care Mehek Grega: Cathlean Cower Other Clinician: Referring Astou Lada: Treating Misa Fedorko/Extender: [name] in Treatment: 19 Electronic Signature(s) Signed: 08/06/2021 5:44:59 PM By: Dellie Catholic RN Entered By: Dellie Catholic on 08/06/2021 13:16:28 -------------------------------------------------------------------------------- Multi Wound Chart Details Patient Name: Date of Service: [name] [facility] [place] M. 08/06/2021 1:00 PM Medical Record Number: [id number] Patient Account Number: [id number] Date of Birth/Sex: Treating RN: November 30, 1980 (41 y.o. Collene Gobble Primary Care Aysen Shieh: Cathlean Cower Other Clinician: Referring Murrel Freet: Treating Phoebe Marter/Extender: [name] in Treatment: 19 Vital Signs Height(in): 78 Pulse(bpm): 93 Weight(lbs): 170 Blood Pressure(mmHg): 129/68 Body Mass Index(BMI): 19.6 Temperature(F): 97.7 Respiratory Rate(breaths/min): 18 Photos: [N/A:N/A] Left Ischium N/A N/A Wound Location: Pressure Injury N/A N/A Wounding Event: Pressure Ulcer N/A N/A Primary Etiology: Tobacco Use, Paraplegia N/A N/A Comorbid History: 06/28/2020 N/A N/A Date Acquired: 15 N/A N/A Weeks of Treatment: Open N/A N/A Wound Status: No N/A N/A Wound Recurrence: 5.7x7x6.3  N/A N/A Measurements L x W x D (cm) 31.337 N/A N/A A (cm) : rea 197.426 N/A N/A Volume (cm) : 51.80% N/A N/A % Reduction in A rea: 53.30% N/A N/A % Reduction in Volume: 12 Starting Position 1 (o'clock): 7 Ending Position 1 (o'clock): 6.5 Maximum Distance 1 (cm): Yes N/A N/A Undermining: Category/Stage IV N/A N/A Classification: Large N/A N/A Exudate A mount: Serosanguineous N/A N/A Exudate Type: red, brown N/A N/A Exudate Color: Distinct, outline attached N/A N/A Wound Margin: Large (67-100%) N/A N/A Granulation Amount:  Red, Pink N/A N/A Granulation Quality: Small (1-33%) N/A N/A Necrotic Amount: Fat Layer (Subcutaneous Tissue): Yes N/A N/A Exposed Structures: Fascia: No Tendon: No Muscle: No Joint: No Bone: No None N/A N/A Epithelialization: NPWT was on at admission of visit N/A N/A Assessment Notes: Treatment Notes Electronic Signature(s) Signed: 08/06/2021 5:29:10 PM By: Linton Ham MD Signed: 08/06/2021 5:44:59 PM By: Dellie Catholic RN Entered By: Linton Ham on 08/06/2021 14:00:30 -------------------------------------------------------------------------------- Multi-Disciplinary Care Plan Details Patient Name: Date of Service: [place] Kathe Mariner, A [facility] [place] M. 08/06/2021 1:00 PM Medical Record Number: [id number] Patient Account Number: [id number] Date of Birth/Sex: Treating RN: [birth year] (41 y.o. Collene Gobble Primary Care Rosan Calbert: Cathlean Cower Other Clinician: Referring Stevey Stapleton: Treating Sander Speckman/Extender: [name] in Treatment: 19 Active Inactive Wound/Skin Impairment Nursing Diagnoses: Impaired tissue integrity Knowledge deficit related to ulceration/compromised skin integrity Goals: Patient/caregiver will verbalize understanding of skin care regimen Date Initiated: 03/25/2021 Target Resolution Date: 08/28/2021 Goal Status: Active Interventions: Assess patient/caregiver ability to obtain necessary  supplies Notes: Electronic Signature(s) Signed: 08/06/2021 5:44:59 PM By: Dellie Catholic RN Entered By: Dellie Catholic on 08/06/2021 17:36:20 -------------------------------------------------------------------------------- Pain Assessment Details Patient Name: Date of Service: Hassan Rowan [place] M. 08/06/2021 1:00 PM Medical Record Number: [id number] Patient Account Number: [id number] Date of Birth/Sex: Treating RN: [birth year] (41 y.o. Collene Gobble Primary Care Dyamon Sosinski: Cathlean Cower Other Clinician: Referring Nehemyah Foushee: Treating Laiklyn Pilkenton/Extender: [name] in Treatment: 19 Active Problems Location of Pain Severity and Description of Pain Patient Has Paino No Site Locations Pain Management and Medication Current Pain Management: Electronic Signature(s) Signed: 08/06/2021 5:44:59 PM By: Dellie Catholic RN Entered By: Dellie Catholic on 08/06/2021 13:16:18 -------------------------------------------------------------------------------- Patient/Caregiver Education Details Patient Name: Date of Service: [place] Lujean Rave. 2/9/2023andnbsp1:00 PM Medical Record Number: [id number] Patient Account Number: [id number] Date of Birth/Gender: Treating RN: 1980-07-26 (41 y.o. Collene Gobble Primary Care Physician: Cathlean Cower Other Clinician: Referring Physician: Treating Physician/Extender: [name] in Treatment: 19 Education Assessment Education Provided To: Patient Education Topics Provided Wound/Skin Impairment: Methods: Explain/Verbal Responses: Return demonstration correctly Electronic Signature(s) Signed: 08/06/2021 5:44:59 PM By: Dellie Catholic RN Entered By: Dellie Catholic on 08/06/2021 17:36:46 -------------------------------------------------------------------------------- Wound Assessment Details Patient Name: Date of Service: Hassan Rowan [place] M. 08/06/2021 1:00 PM Medical Record Number: [id number] Patient  Account Number: [id number] Date of Birth/Sex: Treating RN: 05-04-1981 (41 y.o. Collene Gobble Primary Care Charnetta Wulff: Cathlean Cower Other Clinician: Referring Celestine Bougie: Treating Arisha Gervais/Extender: [name] in Treatment: 19 Wound Status Wound Number: 7 Primary Etiology: Pressure Ulcer Wound Location: Left Ischium Wound Status: Open Wounding Event: Pressure Injury Comorbid History: Tobacco Use, Paraplegia Date Acquired: 06/28/2020 Weeks Of Treatment: 19 Clustered Wound: No Photos Wound Measurements Length: (cm) 5.7 Width: (cm) 7 Depth: (cm) 6.3 Area: (cm) 31.337 Volume: (cm) 197.426 % Reduction in Area: 51.8% % Reduction in Volume: 53.3% Epithelialization: None Undermining: Yes Starting Position (o'clock): 12 Ending Position (o'clock): 7 Maximum Distance: (cm) 6.5 Wound Description Classification: Category/Stage IV Wound Margin: Distinct, outline attached Exudate Amount: Large Exudate Type: Serosanguineous Exudate Color: red, brown Foul Odor After Cleansing: No Slough/Fibrino Yes Wound Bed Granulation Amount: Large (67-100%) Exposed Structure Granulation Quality: Red, Pink Fascia Exposed: No Necrotic Amount: Small (1-33%) Fat Layer (Subcutaneous Tissue) Exposed: Yes Necrotic Quality: Adherent Slough Tendon Exposed: No Muscle Exposed: No Joint Exposed: No Bone Exposed: No Assessment Notes NPWT was on at admission of visit Treatment Notes Wound #7 (Ischium) Wound  Laterality: Left Cleanser Normal Saline Discharge Instruction: Cleanse the wound with Normal Saline prior to applying a clean dressing using gauze sponges, not tissue or cotton balls. Soap and Water Discharge Instruction: May shower and wash wound with dial antibacterial soap and water prior to dressing change. Peri-Wound Care Topical Primary Dressing Wet-to-dry Discharge Instruction: apply wet to dry dressing daily Secondary Dressing Woven Gauze Sponge, Non-Sterile 4x4  in Discharge Instruction: Wet-to-dry HH M W F ABD Pad, 5x9 Discharge Instruction: Apply over primary dressing as directed. Secured With 20M Medipore H Soft Cloth Surgical T 4 x 2 (in/yd) ape Discharge Instruction: Secure dressing with tape as directed. Compression Wrap Compression Stockings Add-Ons Electronic Signature(s) Signed: 08/06/2021 5:44:59 PM By: Dellie Catholic RN Entered By: Dellie Catholic on 08/06/2021 13:26:22 -------------------------------------------------------------------------------- Vitals Details Patient Name: Date of Service: [name] [place] M. 08/06/2021 1:00 PM Medical Record Number: [id number] Patient Account Number: [id number] Date of Birth/Sex: Treating RN: 03/12/1981 (41 y.o. Collene Gobble Primary Care Shaunda Tipping: Cathlean Cower Other Clinician: Referring Bellina Tokarczyk: Treating Trevor Wilkie/Extender: [name] in Treatment: 19 Vital Signs Time Taken: 13:16 Temperature (F): 97.7 Height (in): 78 Pulse (bpm): 93 Weight (lbs): 170 Respiratory Rate (breaths/min): 18 Body Mass Index (BMI): 19.6 Blood Pressure (mmHg): 129/68 Reference Range: 80 - 120 mg / dl Electronic Signature(s) Signed: 08/06/2021 5:44:59 PM By: Dellie Catholic RN Entered By: Dellie Catholic on 08/06/2021 13:16:08

## 2021-08-07 NOTE — Telephone Encounter (Signed)
Patients paperwork has been faxed. ?

## 2021-08-07 NOTE — Progress Notes (Signed)
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 continued underlying infection here. He continues on Augmentin as noted 3. I did a swab culture not a deep tissue culture or bone culture although I could certainly do both. 4. I am concerned about this I have ordered a plain x-ray of the pelvis but to be truthful a repeat CT scan may be necessary Electronic Signature(s) Signed: 08/06/2021 6:05:40 PM By: Levan Hurst RN, BSN Signed: 08/07/2021 12:25:06 PM By: Linton Ham MD Previous Signature: 08/06/2021 5:29:10 PM Version By: Linton Ham MD Entered By: Levan Hurst on 08/06/2021 17:43:16 -------------------------------------------------------------------------------- SuperBill Details Patient Name: Date of Service: John John Figueroa [place]. 08/06/2021 Medical Record Number: [id number] Patient Account Number: [id number] Date of Birth/Sex: Treating RN: 06-24-1981 (41 y.o. Collene Gobble Primary Care Provider:  Cathlean Cower Other Clinician: Referring Provider: Treating Provider/Extender: [name] in Treatment: 19 Diagnosis Coding ICD-10 Codes Code Description [phone] Pressure ulcer of left buttock, stage 4 T81.31XD Disruption of external operation (surgical) wound, not elsewhere classified, subsequent encounter M72.6 Necrotizing fasciitis G82.21 Paraplegia, complete M86.38 Chronic multifocal osteomyelitis, other site Facility Procedures CPT4 Code: [id number] Description: 11102-Tangential biopsy of skin (e.g., shave, scoop, saucerize, curette) single lesion ICD-10 Diagnosis Description 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, stage 4 Modifier: Quantity: 1 Physician Procedures : CPT4 Code Description Modifier [id number] - WC PHYS LEVEL 4 - EST PT ICD-10 Diagnosis Description 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, stage 4 T81.31XD Disruption of external operation (surgical) wound, not elsewhere classified, subsequent  encounter M86.38 Chronic multifocal osteomyelitis, other site Quantity: 1 : 11102 Tangential biopsy of skin (e.g., shave, scoop, saucerize, curette) single lesion ICD-10 Diagnosis Description 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, stage 4 Quantity: 1 Electronic Signature(s) Signed: 08/06/2021 6:05:40 PM By: Levan Hurst RN, BSN Signed: 08/07/2021 12:25:06 PM By: Linton Ham MD Previous Signature: 08/06/2021 5:29:10 PM Version By: Linton Ham MD Entered By: Levan Hurst on 08/06/2021 17:43:44

## 2021-08-09 ENCOUNTER — Encounter: Payer: Self-pay | Admitting: Infectious Disease

## 2021-08-09 DIAGNOSIS — Z7185 Encounter for immunization safety counseling: Secondary | ICD-10-CM

## 2021-08-09 HISTORY — DX: Encounter for immunization safety counseling: Z71.85
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
## 2021-08-10 ENCOUNTER — Ambulatory Visit (INDEPENDENT_AMBULATORY_CARE_PROVIDER_SITE_OTHER): Payer: Managed Care, Other (non HMO) | Admitting: Infectious Disease

## 2021-08-10 ENCOUNTER — Other Ambulatory Visit: Payer: Self-pay

## 2021-08-10 DIAGNOSIS — N493 Fournier gangrene: Secondary | ICD-10-CM | POA: Diagnosis not present

## 2021-08-10 DIAGNOSIS — R3589 Other polyuria: Secondary | ICD-10-CM

## 2021-08-10 DIAGNOSIS — G8220 Paraplegia, unspecified: Secondary | ICD-10-CM

## 2021-08-10 DIAGNOSIS — Z7185 Encounter for immunization safety counseling: Secondary | ICD-10-CM

## 2021-08-10 DIAGNOSIS — A4902 Methicillin resistant Staphylococcus aureus infection, unspecified site: Secondary | ICD-10-CM

## 2021-08-10 DIAGNOSIS — A429 Actinomycosis, unspecified: Secondary | ICD-10-CM

## 2021-08-10 DIAGNOSIS — M86652 Other chronic osteomyelitis, left thigh: Secondary | ICD-10-CM

## 2021-08-10 DIAGNOSIS — M726 Necrotizing fasciitis: Secondary | ICD-10-CM

## 2021-08-10 MED ORDER — AMOXICILLIN-POT CLAVULANATE 875-125 MG PO TABS: 1.0000 | ORAL_TABLET | Freq: Two times a day (BID) | ORAL | 5 refills | Status: AC

## 2021-08-12 ENCOUNTER — Telehealth: Payer: Self-pay

## 2021-08-12 LAB — AEROBIC/ANAEROBIC CULTURE W GRAM STAIN (SURGICAL/DEEP WOUND): Gram Stain: NONE SEEN

## 2021-08-12 NOTE — Telephone Encounter (Signed)
Darl Pikes is calling stating that Debbie at [employer] received some forms but didn't receive the Physicians orders with signature and date.   Debbie at [facility] [phone] Phone [phone] Fax  [facility] manager from [place]  [phone] EXT [phone]

## 2021-08-13 ENCOUNTER — Telehealth: Payer: Self-pay

## 2021-08-13 LAB — CBC WITH DIFFERENTIAL/PLATELET
Absolute Monocytes: 600 cells/uL (ref 200–950)
Basophils Absolute: 32 cells/uL (ref 0–200)
Basophils Relative: 0.4 %
Eosinophils Absolute: 400 cells/uL (ref 15–500)
Eosinophils Relative: 5 %
HCT: 27.1 % — ABNORMAL LOW (ref 38.5–50.0)
Hemoglobin: 8.4 g/dL — ABNORMAL LOW (ref 13.2–17.1)
Lymphs Abs: 1600 cells/uL (ref 850–3900)
MCH: 25.8 pg — ABNORMAL LOW (ref 27.0–33.0)
MCHC: 31 g/dL — ABNORMAL LOW (ref 32.0–36.0)
MCV: 83.4 fL (ref 80.0–100.0)
MPV: 9.5 fL (ref 7.5–12.5)
Monocytes Relative: 7.5 %
Neutro Abs: 5368 cells/uL (ref 1500–7800)
Neutrophils Relative %: 67.1 %
Platelets: 628 10*3/uL — ABNORMAL HIGH (ref 140–400)
RBC: 3.25 10*6/uL — ABNORMAL LOW (ref 4.20–5.80)
RDW: 17.6 % — ABNORMAL HIGH (ref 11.0–15.0)
Total Lymphocyte: 20 %
WBC: 8 10*3/uL (ref 3.8–10.8)

## 2021-08-13 LAB — URINALYSIS, ROUTINE W REFLEX MICROSCOPIC
Bacteria, UA: NONE SEEN /HPF
Bilirubin Urine: NEGATIVE
Glucose, UA: NEGATIVE
Hgb urine dipstick: NEGATIVE
Hyaline Cast: NONE SEEN /LPF
Ketones, ur: NEGATIVE
Nitrite: NEGATIVE
RBC / HPF: NONE SEEN /HPF (ref 0–2)
Specific Gravity, Urine: 1.025 (ref 1.001–1.035)
pH: 6 (ref 5.0–8.0)

## 2021-08-13 LAB — C-REACTIVE PROTEIN: CRP: 77.7 mg/L — ABNORMAL HIGH (ref <8.0)

## 2021-08-13 LAB — SEDIMENTATION RATE: Sed Rate: >130 mm/h — ABNORMAL HIGH (ref 0–15)

## 2021-08-13 LAB — BASIC METABOLIC PANEL WITH GFR
BUN/Creatinine Ratio: 19 (calc) (ref 6–22)
BUN: 11 mg/dL (ref 7–25)
CO2: 28 mmol/L (ref 20–32)
Calcium: 9.1 mg/dL (ref 8.6–10.3)
Chloride: 102 mmol/L (ref 98–110)
Creat: 0.58 mg/dL — ABNORMAL LOW (ref 0.60–1.29)
Glucose, Bld: 79 mg/dL (ref 65–99)
Potassium: 3.9 mmol/L (ref 3.5–5.3)
Sodium: 137 mmol/L (ref 135–146)
eGFR: 125 mL/min/{1.73_m2} (ref >=60)

## 2021-08-13 LAB — URINE CULTURE
MICRO NUMBER:: 13001040
SPECIMEN QUALITY:: ADEQUATE

## 2021-08-13 LAB — MICROSCOPIC MESSAGE

## 2021-08-13 NOTE — Telephone Encounter (Signed)
Patient aware of results and voiced his understanding. ° °John Figueroa, CMA ° °

## 2021-08-13 NOTE — Telephone Encounter (Signed)
-----   Message from Randall Hiss, MD sent at 08/12/2021  5:06 PM EST ----- Urine culture is not consistent with urinary tract infections will not prescribe antibiotics for this ----- Message ----- From: Interface, [facility] Results In Sent: 08/10/2021  11:03 PM EST To: Randall Hiss, MD

## 2021-08-14 NOTE — Telephone Encounter (Signed)
Forms having been filled out and faxed

## 2021-08-18 ENCOUNTER — Telehealth: Payer: Self-pay | Admitting: Internal Medicine

## 2021-08-18 NOTE — Telephone Encounter (Signed)
Pts mother states "[employer] [facility] [employer]" sent the request for the incontinence underwear    *see below*

## 2021-08-18 NOTE — Telephone Encounter (Signed)
Pts mother states [employer] faxed a request "last week" to change pts incontinence underwear from wings to pull ups  Mother requesting a c/b once request has been completed and faxed back

## 2021-08-20 ENCOUNTER — Other Ambulatory Visit (HOSPITAL_COMMUNITY): Payer: Self-pay | Admitting: Internal Medicine

## 2021-08-20 ENCOUNTER — Ambulatory Visit (HOSPITAL_COMMUNITY)
Admission: RE | Admit: 2021-08-20 | Discharge: 2021-08-20 | Disposition: A | Discharge status: Home / Self Care | Payer: Managed Care, Other (non HMO) | Source: Ambulatory Visit | Attending: Internal Medicine | Admitting: Internal Medicine

## 2021-08-20 ENCOUNTER — Encounter (HOSPITAL_BASED_OUTPATIENT_CLINIC_OR_DEPARTMENT_OTHER): Payer: Managed Care, Other (non HMO) | Attending: Internal Medicine | Admitting: Internal Medicine

## 2021-08-20 ENCOUNTER — Other Ambulatory Visit: Payer: Self-pay

## 2021-08-20 DIAGNOSIS — Z87440 Personal history of urinary (tract) infections: Secondary | ICD-10-CM | POA: Insufficient documentation

## 2021-08-20 DIAGNOSIS — Z993 Dependence on wheelchair: Secondary | ICD-10-CM | POA: Insufficient documentation

## 2021-08-20 DIAGNOSIS — T8131XA Disruption of external operation (surgical) wound, not elsewhere classified, initial encounter: Secondary | ICD-10-CM | POA: Diagnosis present

## 2021-08-20 DIAGNOSIS — L89324 Pressure ulcer of left buttock, stage 4: Secondary | ICD-10-CM | POA: Insufficient documentation

## 2021-08-20 DIAGNOSIS — M8638 Chronic multifocal osteomyelitis, other site: Secondary | ICD-10-CM | POA: Diagnosis not present

## 2021-08-20 DIAGNOSIS — B9562 Methicillin resistant Staphylococcus aureus infection as the cause of diseases classified elsewhere: Secondary | ICD-10-CM | POA: Diagnosis not present

## 2021-08-20 DIAGNOSIS — G8221 Paraplegia, complete: Secondary | ICD-10-CM | POA: Insufficient documentation

## 2021-08-20 NOTE — Progress Notes (Signed)
ERVIN, BHARDWAJ ([id number]) Visit Report for 08/20/2021 Arrival Information Details Patient Name: Date of Service: [name] [facility] [place]. 08/20/2021 12:45 PM Medical Record Number: [id number] Patient Account Number: [id number] Date of Birth/Sex: Treating RN: 08-02-1980 (41 y.o. John Figueroa Primary Care Evellyn Tuff: Cathlean Cower Other Clinician: Referring Damarious Holtsclaw: Treating Florentine Diekman/Extender: [name] in Treatment: 21 Visit Information History Since Last Visit Added or deleted any medications: No Patient Arrived: Wheel Chair Any new allergies or adverse reactions: No Arrival Time: 12:52 Had a fall or experienced change in No Transfer Assistance: None activities of daily living that may affect Patient Requires Transmission-Based Precautions: No risk of falls: Patient Has Alerts: No Signs or symptoms of abuse/neglect since last visito No Hospitalized since last visit: No Implantable device outside of the clinic excluding No cellular tissue based products placed in the center since last visit: Has Dressing in Place as Prescribed: Yes Pain Present Now: No Electronic Signature(s) Signed: 08/20/2021 5:58:37 PM By: Dellie Catholic RN Entered By: Dellie Catholic on 08/20/2021 12:55:51 -------------------------------------------------------------------------------- Clinic Level of Care Assessment Details Patient Name: Date of Service: John Figueroa [place]. 08/20/2021 12:45 PM Medical Record Number: [id number] Patient Account Number: [id number] Date of Birth/Sex: Treating RN: 1981/03/12 (41 y.o. John Figueroa Primary Care Camary Sosa: Cathlean Cower Other Clinician: Referring Chantea Surace: Treating Myrakle Wingler/Extender: [name] in Treatment: 21 Clinic Level of Care Assessment Items TOOL 4 Quantity Score X- 1 0 Use when only an EandM is performed on FOLLOW-UP visit ASSESSMENTS - Nursing Assessment / Reassessment X- 1 10 Reassessment of  Co-morbidities (includes updates in patient status) X- 1 5 Reassessment of Adherence to Treatment Plan ASSESSMENTS - Wound and Skin A ssessment / Reassessment X - Simple Wound Assessment / Reassessment - one wound 1 5 []  - 0 Complex Wound Assessment / Reassessment - multiple wounds []  - 0 Dermatologic / Skin Assessment (not related to wound area) ASSESSMENTS - Focused Assessment []  - 0 Circumferential Edema Measurements - multi extremities []  - 0 Nutritional Assessment / Counseling / Intervention []  - 0 Lower Extremity Assessment (monofilament, tuning fork, pulses) []  - 0 Peripheral Arterial Disease Assessment (using hand held doppler) ASSESSMENTS - Ostomy and/or Continence Assessment and Care []  - 0 Incontinence Assessment and Management []  - 0 Ostomy Care Assessment and Management (repouching, etc.) PROCESS - Coordination of Care X - Simple Patient / Family Education for ongoing care 1 15 []  - 0 Complex (extensive) Patient / Family Education for ongoing care X- 1 10 Staff obtains Programmer, systems, Records, T Results / Process Orders est X- 1 10 Staff telephones HHA, Nursing Homes / Clarify orders / etc []  - 0 Routine Transfer to another Facility (non-emergent condition) []  - 0 Routine Hospital Admission (non-emergent condition) []  - 0 New Admissions / Biomedical engineer / Ordering NPWT Apligraf, etc. , []  - 0 Emergency Hospital Admission (emergent condition) X- 1 10 Simple Discharge Coordination []  - 0 Complex (extensive) Discharge Coordination PROCESS - Special Needs []  - 0 Pediatric / Minor Patient Management []  - 0 Isolation Patient Management []  - 0 Hearing / Language / Visual special needs []  - 0 Assessment of Community assistance (transportation, D/C planning, etc.) []  - 0 Additional assistance / Altered mentation []  - 0 Support Surface(s) Assessment (bed, cushion, seat, etc.) INTERVENTIONS - Wound Cleansing / Measurement X - Simple Wound Cleansing -  one wound 1 5 []  - 0 Complex Wound Cleansing - multiple wounds X- 1 5 Wound Imaging (photographs -  any number of wounds) []  - 0 Wound Tracing (instead of photographs) X- 1 5 Simple Wound Measurement - one wound []  - 0 Complex Wound Measurement - multiple wounds INTERVENTIONS - Wound Dressings X - Small Wound Dressing one or multiple wounds 1 10 []  - 0 Medium Wound Dressing one or multiple wounds []  - 0 Large Wound Dressing one or multiple wounds []  - 0 Application of Medications - topical []  - 0 Application of Medications - injection INTERVENTIONS - Miscellaneous []  - 0 External ear exam []  - 0 Specimen Collection (cultures, biopsies, blood, body fluids, etc.) []  - 0 Specimen(s) / Culture(s) sent or taken to Lab for analysis []  - 0 Patient Transfer (multiple staff / Civil Service fast streamer / Similar devices) []  - 0 Simple Staple / Suture removal (25 or less) []  - 0 Complex Staple / Suture removal (26 or more) []  - 0 Hypo / Hyperglycemic Management (close monitor of Blood Glucose) []  - 0 Ankle / Brachial Index (ABI) - do not check if billed separately X- 1 5 Vital Signs Has the patient been seen at the hospital within the last three years: Yes Total Score: 95 Level Of Care: New/Established - Level 3 Electronic Signature(s) Signed: 08/20/2021 5:58:37 PM By: Dellie Catholic RN Entered By: Dellie Catholic on 08/20/2021 17:56:40 -------------------------------------------------------------------------------- Encounter Discharge Information Details Patient Name: Date of Service: [name] [facility] [place] M. 08/20/2021 12:45 PM Medical Record Number: [id number] Patient Account Number: [id number] Date of Birth/Sex: Treating RN: Jul 19, 1980 (41 y.o. John Figueroa Primary Care Dom Haverland: Cathlean Cower Other Clinician: Referring Sabine Tenenbaum: Treating Wrenley Sayed/Extender: [name] in Treatment: 21 Encounter Discharge Information Items Discharge Condition:  Stable Ambulatory Status: Wheelchair Discharge Destination: Home Transportation: Private Auto Accompanied By: self Schedule Follow-up Appointment: Yes Clinical Summary of Care: Patient Declined Electronic Signature(s) Signed: 08/20/2021 5:58:37 PM By: Dellie Catholic RN Entered By: Dellie Catholic on 08/20/2021 17:57:34 -------------------------------------------------------------------------------- Lower Extremity Assessment Details Patient Name: Date of Service: John Figueroa [place]. 08/20/2021 12:45 PM Medical Record Number: [id number] Patient Account Number: [id number] Date of Birth/Sex: Treating RN: Nov 20, 1980 (41 y.o. John Figueroa Primary Care Shaneal Barasch: Cathlean Cower Other Clinician: Referring Caeleigh Prohaska: Treating Hema Lanza/Extender: [name] in Treatment: 21 Edema Assessment Assessed: Shirlyn Goltz: Yes] Patrice Paradise: Yes] [Left: Edema] Patrice Paradise: :] Electronic Signature(s) Signed: 08/20/2021 5:58:37 PM By: Dellie Catholic RN Entered By: Dellie Catholic on 08/20/2021 12:59:27 -------------------------------------------------------------------------------- Multi Wound Chart Details Patient Name: Date of Service: MO Jose Persia [place]. 08/20/2021 12:45 PM Medical Record Number: [id number] Patient Account Number: [id number] Date of Birth/Sex: Treating RN: [birth year] (41 y.o. [name] Primary Care Allanah Mcfarland: Cathlean Cower Other Clinician: Referring Weldon [place]: Treating Orvin Netter/Extender: [name] in Treatment: 21 Vital Signs Height(in): 78 Pulse(bpm): 106 Weight(lbs): 170 Blood Pressure(mmHg): 137/91 Body Mass Index(BMI): 19.6 Temperature(F): 98.8 Respiratory Rate(breaths/min): 18 Photos: [N/A:N/A] Left Ischium N/A N/A Wound Location: Pressure Injury N/A N/A Wounding Event: Pressure Ulcer N/A N/A Primary Etiology: Tobacco Use, Paraplegia N/A N/A Comorbid History: 06/28/2020 N/A N/A Date Acquired: 21 N/A N/A Weeks of  Treatment: Open N/A N/A Wound Status: No N/A N/A Wound Recurrence: 5.4x6x7.2 N/A N/A Measurements L x W x D (cm) 25.447 N/A N/A A (cm) : rea 183.218 N/A N/A Volume (cm) : 60.90% N/A N/A % Reduction in A rea: 56.70% N/A N/A % Reduction in Volume: 12 Starting Position 1 (o'clock): 7 Ending Position 1 (o'clock): 6.3 Maximum Distance 1 (cm): Yes N/A N/A Undermining: Category/Stage IV N/A  N/A Classification: Large N/A N/A Exudate A mount: Serosanguineous N/A N/A Exudate Type: red, brown N/A N/A Exudate Color: Distinct, outline attached N/A N/A Wound Margin: Large (67-100%) N/A N/A Granulation A mount: Red, Pink N/A N/A Granulation Quality: Small (1-33%) N/A N/A Necrotic A mount: Fat Layer (Subcutaneous Tissue): Yes N/A N/A Exposed Structures: Fascia: No Tendon: No Muscle: No Joint: No Bone: No None N/A N/A Epithelialization: Treatment Notes Electronic Signature(s) Signed: 08/20/2021 5:03:55 PM By: Linton Ham MD Signed: 08/20/2021 5:53:09 PM By: Levan Hurst RN, BSN Entered By: Linton Ham on 08/20/2021 13:09:50 -------------------------------------------------------------------------------- Multi-Disciplinary Care Plan Details Patient Name: Date of Service: [name] [facility] [place]. 08/20/2021 12:45 PM Medical Record Number: [id number] Patient Account Number: [id number] Date of Birth/Sex: Treating RN: Sep 12, 1980 (41 y.o. John Figueroa Primary Care Nidya Bouyer: Cathlean Cower Other Clinician: Referring Sherryll Skoczylas: Treating Juel Ripley/Extender: [name] in Treatment: 21 Active Inactive Wound/Skin Impairment Nursing Diagnoses: Impaired tissue integrity Knowledge deficit related to ulceration/compromised skin integrity Goals: Patient/caregiver will verbalize understanding of skin care regimen Date Initiated: 03/25/2021 Target Resolution Date: 08/28/2021 Goal Status: Active Interventions: Assess patient/caregiver ability to  obtain necessary supplies Notes: Electronic Signature(s) Signed: 08/20/2021 5:58:37 PM By: Dellie Catholic RN Entered By: Dellie Catholic on 08/20/2021 17:55:18 -------------------------------------------------------------------------------- Pain Assessment Details Patient Name: Date of Service: John Figueroa [place] M. 08/20/2021 12:45 PM Medical Record Number: [id number] Patient Account Number: [id number] Date of Birth/Sex: Treating RN: 1981/06/11 (41 y.o. John Figueroa Primary Care Arijana Narayan: Cathlean Cower Other Clinician: Referring Amore Grater: Treating Jonni Oelkers/Extender: [name] in Treatment: 21 Active Problems Location of Pain Severity and Description of Pain Patient Has Paino No Site Locations Pain Management and Medication Current Pain Management: Electronic Signature(s) Signed: 08/20/2021 5:58:37 PM By: Dellie Catholic RN Entered By: Dellie Catholic on 08/20/2021 12:59:20 -------------------------------------------------------------------------------- Patient/Caregiver Education Details Patient Name: Date of Service: [place] Lujean Rave. 2/23/2023andnbsp12:45 PM Medical Record Number: [id number] Patient Account Number: [id number] Date of Birth/Gender: Treating RN: 07-17-1980 (41 y.o. John Figueroa Primary Care Physician: Cathlean Cower Other Clinician: Referring Physician: Treating Physician/Extender: [name] in Treatment: 21 Education Assessment Education Provided To: Patient Education Topics Provided Electronic Signature(s) Signed: 08/20/2021 5:58:37 PM By: Dellie Catholic RN Entered By: Dellie Catholic on 08/20/2021 17:55:35 -------------------------------------------------------------------------------- Wound Assessment Details Patient Name: Date of Service: John Figueroa [place]. 08/20/2021 12:45 PM Medical Record Number: [id number] Patient Account Number: [id number] Date of Birth/Sex: Treating  RN: Nov 09, 1980 (41 y.o. John Figueroa Primary Care Amada Hallisey: Cathlean Cower Other Clinician: Referring Esiquio Boesen: Treating Becca Bayne/Extender: [name] in Treatment: 21 Wound Status Wound Number: 7 Primary Etiology: Pressure Ulcer Wound Location: Left Ischium Wound Status: Open Wounding Event: Pressure Injury Comorbid History: Tobacco Use, Paraplegia Date Acquired: 06/28/2020 Weeks Of Treatment: 21 Clustered Wound: No Photos Wound Measurements Length: (cm) 5.4 Width: (cm) 6 Depth: (cm) 7.2 Area: (cm) 25.447 Volume: (cm) 183.218 % Reduction in Area: 60.9% % Reduction in Volume: 56.7% Epithelialization: None Undermining: Yes Starting Position (o'clock): 12 Ending Position (o'clock): 7 Maximum Distance: (cm) 6.3 Wound Description Classification: Category/Stage IV Wound Margin: Distinct, outline attached Exudate Amount: Large Exudate Type: Serosanguineous Exudate Color: red, brown Foul Odor After Cleansing: No Slough/Fibrino Yes Wound Bed Granulation Amount: Large (67-100%) Exposed Structure Granulation Quality: Red, Pink Fascia Exposed: No Necrotic Amount: Small (1-33%) Fat Layer (Subcutaneous Tissue) Exposed: Yes Necrotic Quality: Adherent Slough Tendon Exposed: No Muscle Exposed: No Joint Exposed: No Bone Exposed:  No Treatment Notes Wound #7 (Ischium) Wound Laterality: Left Cleanser Normal Saline Discharge Instruction: Cleanse the wound with Normal Saline prior to applying a clean dressing using gauze sponges, not tissue or cotton balls. Soap and Water Discharge Instruction: May shower and wash wound with dial antibacterial soap and water prior to dressing change. Peri-Wound Care Topical Primary Dressing Wet-to-dry Discharge Instruction: apply wet to dry dressing daily Secondary Dressing Woven Gauze Sponge, Non-Sterile 4x4 in Discharge Instruction: Wet-to-dry HH M W F ABD Pad, 5x9 Discharge Instruction: Apply over primary  dressing as directed. Secured With 63M Medipore H Soft Cloth Surgical T 4 x 2 (in/yd) ape Discharge Instruction: Secure dressing with tape as directed. Compression Wrap Compression Stockings Add-Ons Electronic Signature(s) Signed: 08/20/2021 2:56:00 PM By: Sandre Kitty Signed: 08/20/2021 5:58:37 PM By: Dellie Catholic RN Entered By: Sandre Kitty on 08/20/2021 12:59:11 -------------------------------------------------------------------------------- Vitals Details Patient Name: Date of Service: MO Jose Persia [place] M. 08/20/2021 12:45 PM Medical Record Number: [id number] Patient Account Number: [id number] Date of Birth/Sex: Treating RN: 1980/12/27 (41 y.o. John Figueroa Primary Care Frederica Chrestman: Cathlean Cower Other Clinician: Referring Leigha Olberding: Treating Myeesha Shane/Extender: [name] in Treatment: 21 Vital Signs Time Taken: 12:52 Temperature (F): 98.8 Height (in): 78 Pulse (bpm): 106 Weight (lbs): 170 Respiratory Rate (breaths/min): 18 Body Mass Index (BMI): 19.6 Blood Pressure (mmHg): 137/91 Reference Range: 80 - 120 mg / dl Electronic Signature(s) Signed: 08/20/2021 5:58:37 PM By: Dellie Catholic RN Entered By: Dellie Catholic on 08/20/2021 12:52:25

## 2021-08-21 NOTE — Telephone Encounter (Signed)
Rep w/ [employer] states they have not received forms notated by cma as faxed on, 08-14-2021  Rep requesting forms re-faxed  Fax  [phone]

## 2021-08-25 NOTE — Progress Notes (Signed)
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 CT scan. I'll communicate with Dr. Drucilla Schmidt. We are not making any progress here at all in fact things of markedly deteriorated. 6 he does not have a malignancy underneath this I had wondered about a squamous cell transformation although the tissue I took last week did not show this #7 wet-to-dry dressings. He did not respond to wound VAC #8 likely is going to need assessment and an academic center [facility] at [facility]) Signed: 08/20/2021 5:03:55 PM By: Linton Ham MD Entered By: Linton Ham on 08/20/2021 13:22:27 -------------------------------------------------------------------------------- SuperBill Details Patient Name: Date of Service: [name] [facility] [place]. 08/20/2021 Medical Record Number: [id number] Patient Account Number: [id number] Date of Birth/Sex: Treating RN: 1980-11-15 (41 y.o. 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, other site Facility Procedures CPT4 Code: [id number] Description: 99213 - WOUND CARE VISIT-LEV 3 EST PT Modifier: Quantity: 1 Physician Procedures : CPT4 Code Description Modifier [id number] 99214 - WC PHYS LEVEL 4 - EST PT ICD-10 Diagnosis Description 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, stage 4 T81.31XD Disruption of external operation (surgical) wound, not elsewhere classified, subsequent  encounter 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

## 2021-08-26 ENCOUNTER — Telehealth: Payer: Self-pay | Admitting: Internal Medicine

## 2021-08-26 NOTE — Telephone Encounter (Signed)
Pt states "I'm on house arrest and have a monitor around my ankle and my legs are swelling." ? ?Pt requesting provider to fax a letter to his lawyer stating why he can not wear the ankle monitor. ? ?Pt states provider has faxed a letter in the past regarding his ankle monitor  ? ?Fax [phone] attn: Oley Balm. Katrinka Blazing ?

## 2021-08-26 NOTE — Telephone Encounter (Signed)
Paperwork faxed to [employer] 08/26/2021 ?

## 2021-08-26 NOTE — Telephone Encounter (Signed)
Letter has been written (okay per provider). Faxed to patient's lawyer. Patient notified.  ?

## 2021-09-03 ENCOUNTER — Encounter (HOSPITAL_BASED_OUTPATIENT_CLINIC_OR_DEPARTMENT_OTHER): Payer: Managed Care, Other (non HMO) | Attending: Internal Medicine | Admitting: General Surgery

## 2021-09-07 ENCOUNTER — Ambulatory Visit: Payer: Managed Care, Other (non HMO) | Admitting: Infectious Disease

## 2021-09-15 ENCOUNTER — Ambulatory Visit: Payer: Managed Care, Other (non HMO) | Admitting: Infectious Disease

## 2021-09-16 ENCOUNTER — Encounter (HOSPITAL_BASED_OUTPATIENT_CLINIC_OR_DEPARTMENT_OTHER): Payer: Managed Care, Other (non HMO) | Attending: General Surgery | Admitting: General Surgery

## 2021-11-10 IMAGING — CT CT PELVIS W/O CM
2 of 6 series · 13 of 46 positions shown, 18 images · non-contrast
Comparison: CT Abdomen and Pelvis 02/10/2021

CLINICAL DATA: 40-year-old paraplegic male with purulent drainage
beneath buttocks. Necrotizing fasciitis. Status post debridement of
left buttocks postoperative day 8. Decubitus wound.

Query deep abscess.
EXAM:
CT PELVIS WITHOUT CONTRAST
TECHNIQUE: Multidetector CT imaging of the pelvis was performed following the
standard protocol without intravenous contrast.

[Series 3: axial (person_name) · axial · 0.94mm/px · z∈[-600,-270]mm · 10 of 191 slices shown, 15 images]
[im 13/191  soft-tissue]
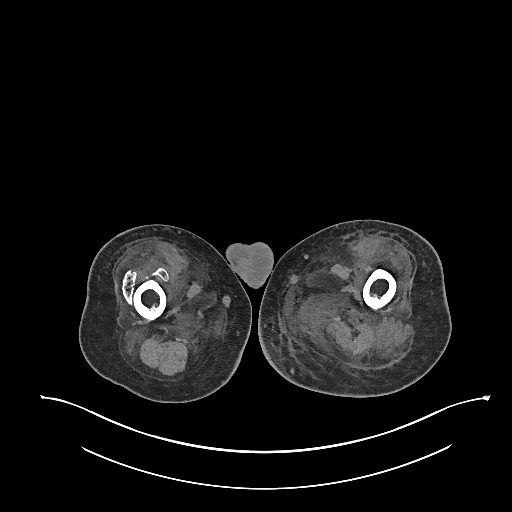
[im 13/191  bone]
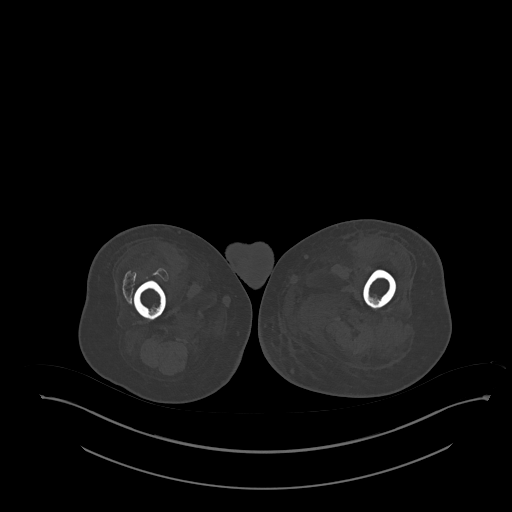
[im 39/191  soft-tissue]
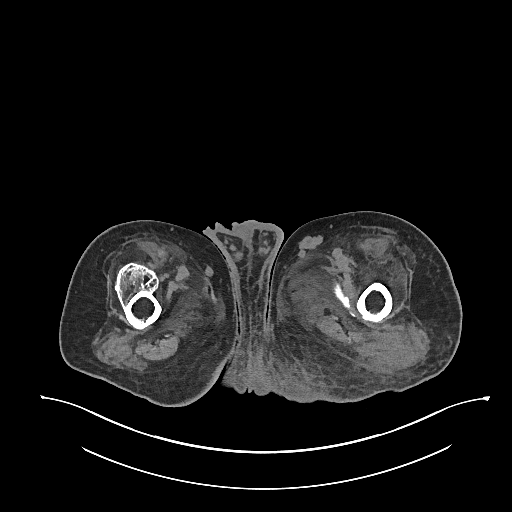
[im 51/191  soft-tissue]
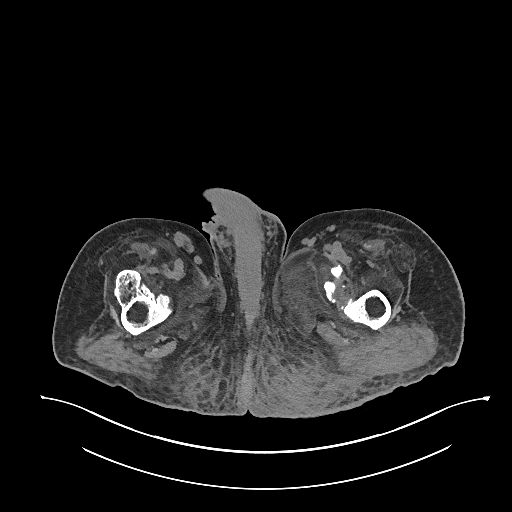
[im 77/191  soft-tissue]
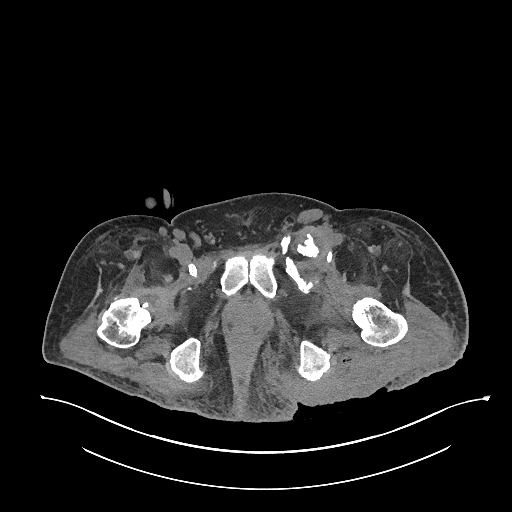
[im 102/191  soft-tissue]
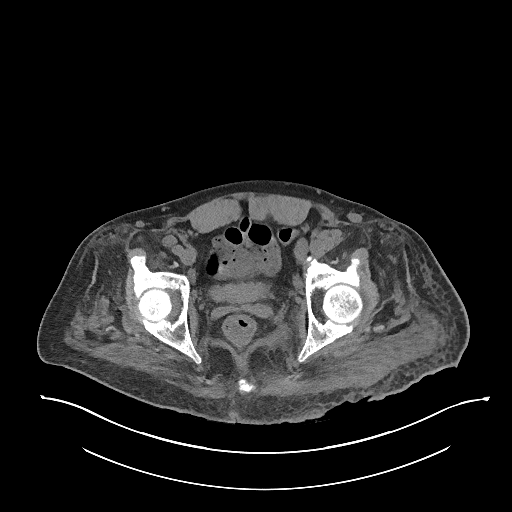
[im 115/191  soft-tissue]
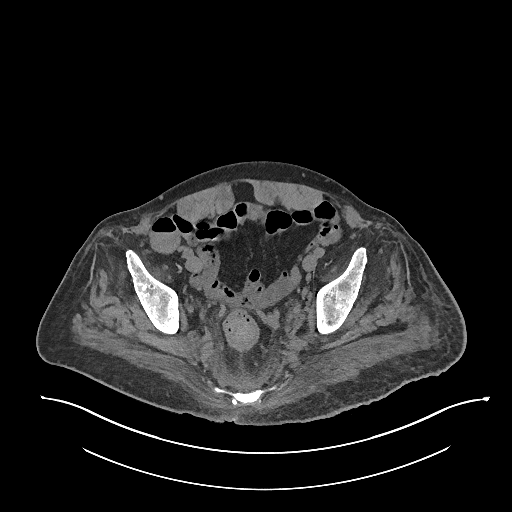
[im 140/191  soft-tissue]
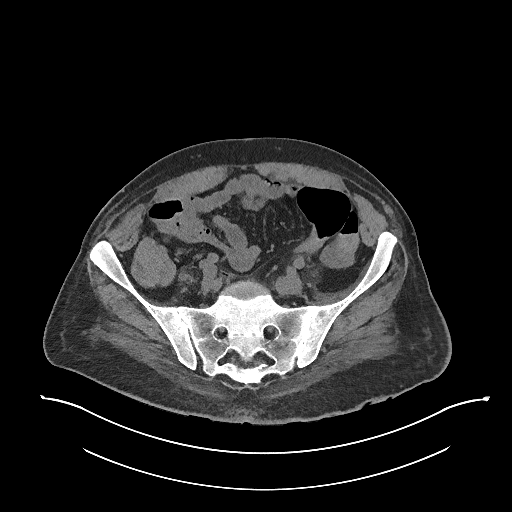
[im 140/191  lung]
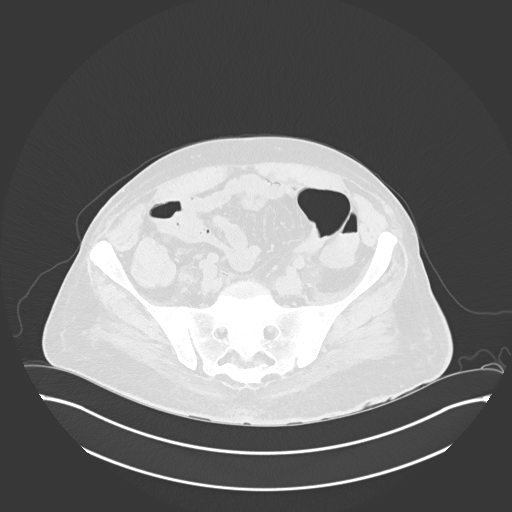
[im 153/191  soft-tissue]
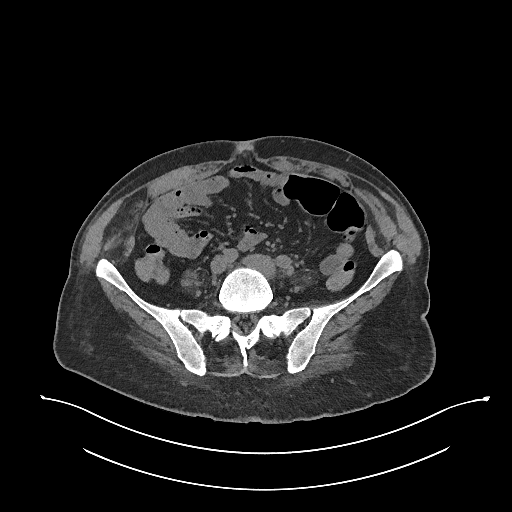
[im 153/191  lung]
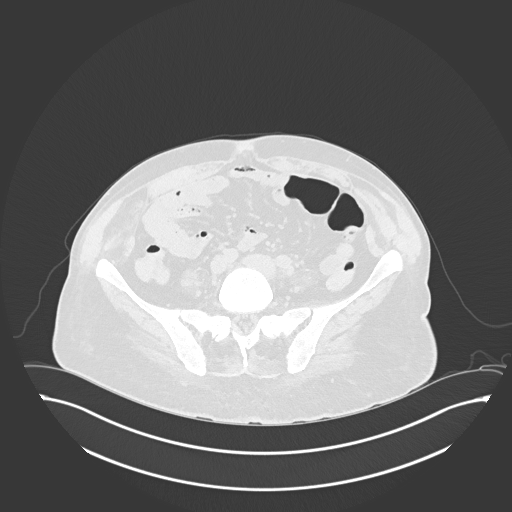
[im 165/191  lung]
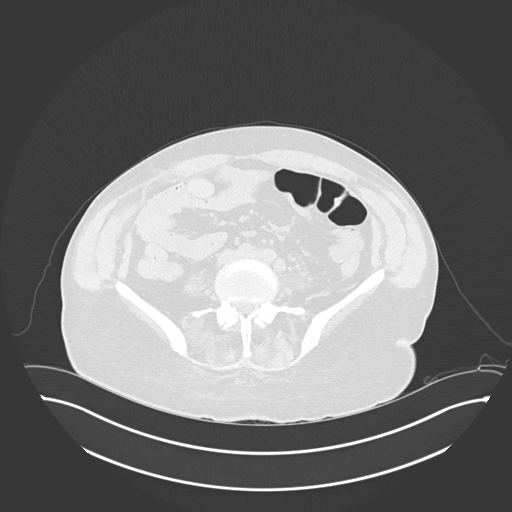
[im 178/191  soft-tissue]
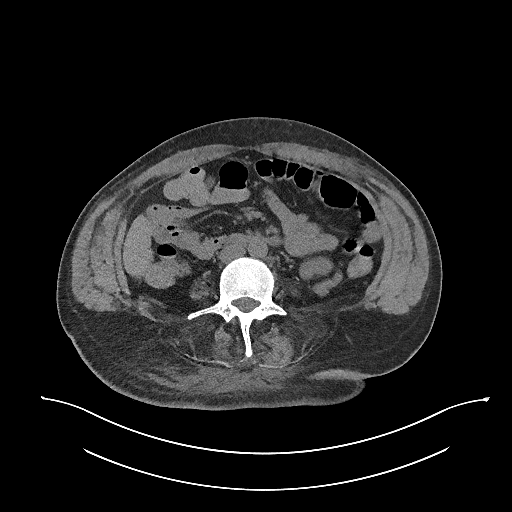
[im 178/191  lung]
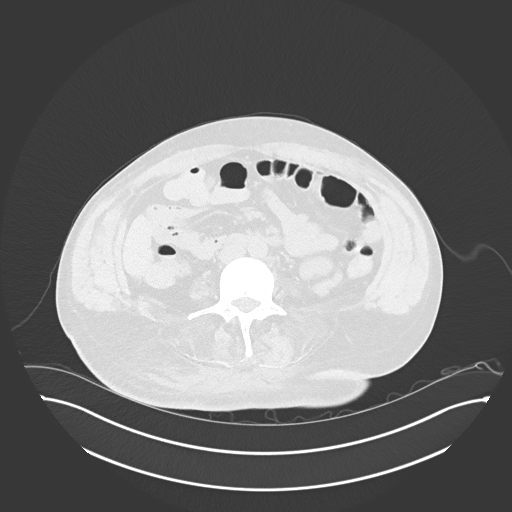
[im 178/191  bone]
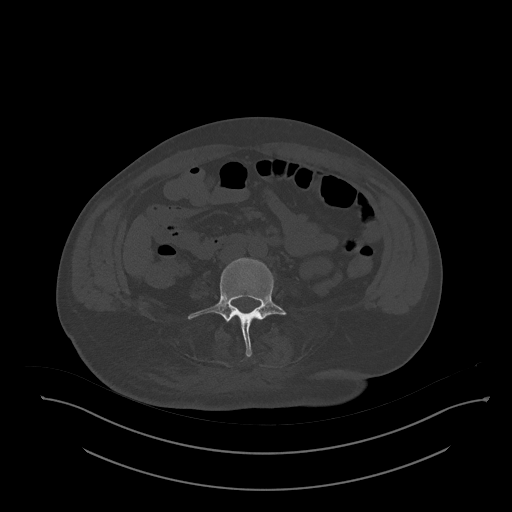

[Series 8: coronal (person_name) · coronal · 0.75mm/px · 3 of 134 slices shown]
[im 34/134  soft-tissue]
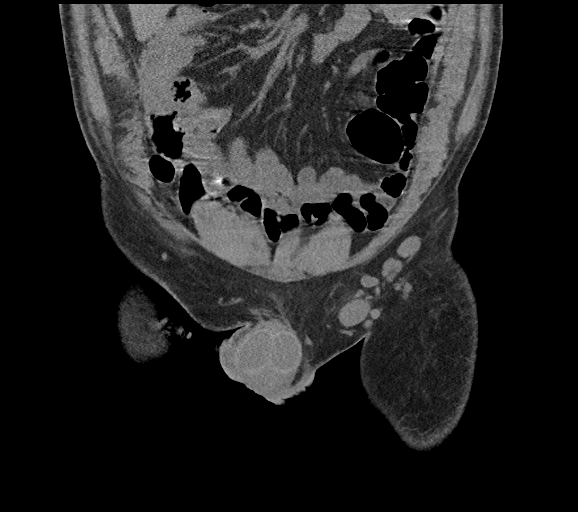
[im 67/134  soft-tissue]
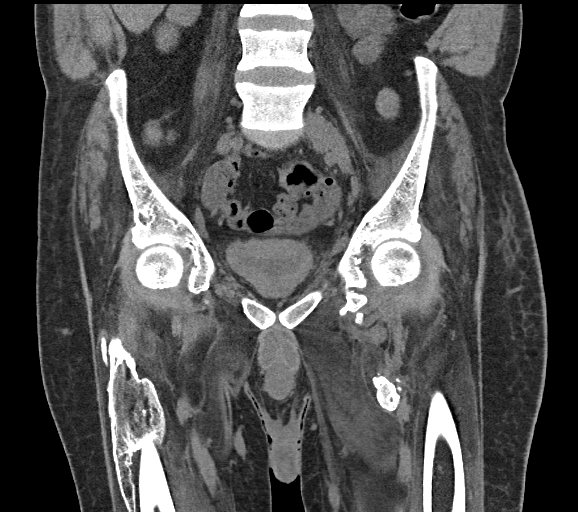
[im 100/134  soft-tissue]
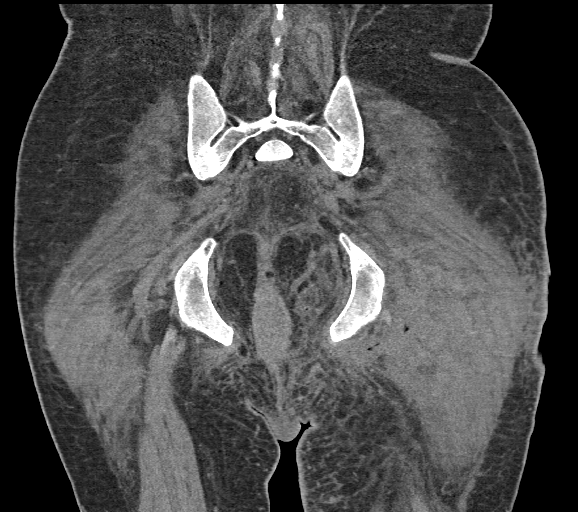

[13 of 46 positions shown; findings below may reference images not displayed]

FINDINGS: Urinary Tract: Kidneys are not included. Diminutive, negative
urinary bladder. Stable left hemipelvis phlebolith.

Bowel: Soft tissue inflammation about the anus, but the anus and
rectum appear to remain within normal limits. Visible distal small
bowel loops and large bowel loops in the lower abdomen and pelvis
are within normal limits.

Confluent presacral stranding has regressed but not resolved. There
is no pelvic free fluid. No pneumoperitoneum identified.

Vascular/Lymphatic: Vascular patency is not evaluated in the absence
of IV contrast. Minimal calcified atherosclerosis. Mildly asymmetric
left inguinal lymph nodes are stable. Small more symmetric bilateral
pelvic lymph nodes are stable.

Reproductive: Mild scrotal wall thickening, but the scrotum and
perineum are largely spared from the decubitus inflammatory process
described below.

Other: Broad-based decubitus wound with epicenter over the left
lower buttock and ischium. The wound tracks medial to the ischium on
series 3, image 111 with decreased deep space soft tissue gas in
that region compared to the preoperative CT. Overlying subcutaneous
tissue has been debrided. Small volume residual soft tissue gas also
now superficial to the sciatic neurovascular bundle (series 3, image
123). Extensive soft tissue thickening and phlegmon in the region.
But no discrete fluid collection is identified on this noncontrast
exam.

Musculoskeletal: No interval osteolysis at the left ischium,
proximal left femur, sacrum or pelvis. Coccygeal segments in the
midline are largely spared the adjacent inflammation.

No acute osseous abnormality identified. Redemonstrated bulky
chronic dystrophic calcifications along the anterior left
acetabulum, anterior pubic rami left greater than right, anterior
right femur and medial proximal left femur.
IMPRESSION: 1. Broad-based decubitus wound over the left lower pelvis/ischium.
No fluid collection is evident. Decreased deep space soft tissue gas
since the preoperative CT. No underlying osteomyelitis is evident by
CT.

2. Regressed but not resolved presacral inflammatory stranding. No
pelvic free fluid.

## 2022-09-06 ENCOUNTER — Telehealth: Payer: Self-pay

## 2022-09-06 NOTE — Transitions of Care (Post Inpatient/ED Visit) (Signed)
   09/06/2022  Name: John Figueroa MRN: [id number] DOB: 04-Oct-1980  Today's TOC FU Call Status: Today's TOC FU Call Status:: Unsuccessul Call (1st Attempt) Unsuccessful Call (1st Attempt) Date: 09/06/22  Attempted to reach the patient regarding the most recent Inpatient/ED visit.  Follow Up Plan: Additional outreach attempts will be made to reach the patient to complete the Transitions of Care (Post Inpatient/ED visit) call.   [place] LPN [place] Advisor Direct Dial [phone]

## 2022-09-07 NOTE — Transitions of Care (Post Inpatient/ED Visit) (Unsigned)
   09/07/2022  Name: John Figueroa MRN: [id number] DOB: 02/16/1981  Today's TOC FU Call Status: Today's TOC FU Call Status:: Unsuccessful Call (2nd Attempt) Unsuccessful Call (1st Attempt) Date: 09/06/22 Unsuccessful Call (2nd Attempt) Date: 09/07/22  Attempted to reach the patient regarding the most recent Inpatient/ED visit.  Follow Up Plan: Additional outreach attempts will be made to reach the patient to complete the Transitions of Care (Post Inpatient/ED visit) call.   [place] LPN [place] Advisor Direct Dial [phone]

## 2022-09-08 NOTE — Transitions of Care (Post Inpatient/ED Visit) (Signed)
   09/08/2022  Name: John Figueroa MRN: [id number] DOB: 01-10-1981  Today's TOC FU Call Status: Today's TOC FU Call Status:: Unsuccessful Call (3rd Attempt) Unsuccessful Call (1st Attempt) Date: 09/06/22 Unsuccessful Call (2nd Attempt) Date: 09/07/22 Unsuccessful Call (3rd Attempt) Date: 09/08/22  Attempted to reach the patient regarding the most recent Inpatient/ED visit.  Follow Up Plan: No further outreach attempts will be made at this time. We have been unable to contact the patient.  [place] LPN [place] Advisor Direct Dial [phone]

## 2024-06-12 ENCOUNTER — Telehealth: Payer: Self-pay

## 2024-06-12 NOTE — Telephone Encounter (Signed)
 Copied from CRM #[id number]. Topic: General - Other >> Jun 12, 2024  3:40 PM Alfonso ORN wrote: Reason for CRM: pt mom would like to know if pcp is able to complete a form for insurance confirming pt is paralyzed/disabled . Pt mom willing to come drop off form in office if so to ensure son stays on coverage . Please advise  Callback: [phone]

## 2024-06-12 NOTE — Telephone Encounter (Signed)
 Called and let Pt mom know the Pt has not been seen since 2022 and would be considered a new Pt and would need to be seen in order for a form to be filled out.

## 2024-06-14 NOTE — Telephone Encounter (Unsigned)
 Copied from CRM #[id number]. Topic: Medical Record Request - Records Request >> Jun 14, 2024  9:24 AM Olam RAMAN wrote: Reason for CRM: Pt mom called about form to be filled out and asked for medical records.  Cb: Home [phone] Montie arvin >> Jun 14, 2024  9:26 AM Olam RAMAN wrote: Mom stated pt in incarcerated

## 2024-06-15 NOTE — Telephone Encounter (Signed)
 Yes, I can probably fill out the form, but I have not seen it yet today; it may be in my inbox, so I will check
